# Patient Record
Sex: Female | Born: 1935
Health system: Southern US, Community
[De-identification: ages and names within clinical notes are randomized; demographics above are authoritative.]

## PROBLEM LIST (undated history)

## (undated) DIAGNOSIS — R609 Edema, unspecified: Secondary | ICD-10-CM

## (undated) DIAGNOSIS — K7689 Other specified diseases of liver: Secondary | ICD-10-CM

## (undated) DIAGNOSIS — E785 Hyperlipidemia, unspecified: Secondary | ICD-10-CM

## (undated) DIAGNOSIS — K589 Irritable bowel syndrome without diarrhea: Secondary | ICD-10-CM

## (undated) DIAGNOSIS — R079 Chest pain, unspecified: Secondary | ICD-10-CM

## (undated) DIAGNOSIS — H409 Unspecified glaucoma: Secondary | ICD-10-CM

## (undated) DIAGNOSIS — T7840XA Allergy, unspecified, initial encounter: Secondary | ICD-10-CM

## (undated) DIAGNOSIS — I10 Essential (primary) hypertension: Secondary | ICD-10-CM

## (undated) DIAGNOSIS — R002 Palpitations: Secondary | ICD-10-CM

## (undated) DIAGNOSIS — R7303 Prediabetes: Secondary | ICD-10-CM

## (undated) DIAGNOSIS — K219 Gastro-esophageal reflux disease without esophagitis: Secondary | ICD-10-CM

## (undated) DIAGNOSIS — J40 Bronchitis, not specified as acute or chronic: Secondary | ICD-10-CM

## (undated) HISTORY — DX: Other specified diseases of liver: K76.89

## (undated) HISTORY — DX: Irritable bowel syndrome, unspecified: K58.9

## (undated) HISTORY — PX: APPENDECTOMY: SHX54

## (undated) HISTORY — DX: Edema, unspecified: R60.9

## (undated) HISTORY — DX: Bronchitis, not specified as acute or chronic: J40

## (undated) HISTORY — PX: WRIST SURGERY: SHX841

## (undated) HISTORY — DX: Palpitations: R00.2

## (undated) HISTORY — DX: Chest pain, unspecified: R07.9

## (undated) HISTORY — DX: Allergy, unspecified, initial encounter: T78.40XA

## (undated) HISTORY — PX: DILATION AND CURETTAGE OF UTERUS: SHX78

## (undated) HISTORY — DX: Hyperlipidemia, unspecified: E78.5

## (undated) HISTORY — DX: Essential (primary) hypertension: I10

## (undated) HISTORY — DX: Gastro-esophageal reflux disease without esophagitis: K21.9

## (undated) HISTORY — DX: Unspecified glaucoma: H40.9

## (undated) HISTORY — DX: Prediabetes: R73.03

---

## 2005-06-13 LAB — HM COLONOSCOPY: HM Colonoscopy: NORMAL

## 2007-04-01 ENCOUNTER — Emergency Department (HOSPITAL_COMMUNITY): Admission: EM | Admit: 2007-04-01 | Discharge: 2007-04-01 | Payer: Self-pay | Admitting: Emergency Medicine

## 2007-04-18 ENCOUNTER — Ambulatory Visit: Payer: Self-pay | Admitting: Cardiology

## 2007-04-22 ENCOUNTER — Ambulatory Visit: Payer: Self-pay | Admitting: Cardiology

## 2007-04-22 LAB — CONVERTED CEMR LAB
ALT: 27 units/L (ref 0–35)
Albumin: 3.4 g/dL — ABNORMAL LOW (ref 3.5–5.2)
Alkaline Phosphatase: 76 units/L (ref 39–117)
Total Bilirubin: 0.8 mg/dL (ref 0.3–1.2)
Total Protein: 6.9 g/dL (ref 6.0–8.3)

## 2007-05-11 ENCOUNTER — Emergency Department (HOSPITAL_COMMUNITY): Admission: EM | Admit: 2007-05-11 | Discharge: 2007-05-11 | Payer: Self-pay | Admitting: Emergency Medicine

## 2007-06-01 ENCOUNTER — Emergency Department (HOSPITAL_COMMUNITY): Admission: EM | Admit: 2007-06-01 | Discharge: 2007-06-01 | Payer: Self-pay | Admitting: Emergency Medicine

## 2007-06-07 ENCOUNTER — Ambulatory Visit: Payer: Self-pay | Admitting: Gastroenterology

## 2007-06-07 LAB — CONVERTED CEMR LAB
AST: 30 units/L (ref 0–37)
Albumin: 3.4 g/dL — ABNORMAL LOW (ref 3.5–5.2)
Basophils Absolute: 0 10*3/uL (ref 0.0–0.1)
Bilirubin, Direct: 0.2 mg/dL (ref 0.0–0.3)
Calcium: 9.7 mg/dL (ref 8.4–10.5)
Chloride: 102 meq/L (ref 96–112)
Eosinophils Absolute: 0.1 10*3/uL (ref 0.0–0.6)
Eosinophils Relative: 1.9 % (ref 0.0–5.0)
GFR calc Af Amer: 91 mL/min
GFR calc non Af Amer: 75 mL/min
Glucose, Bld: 93 mg/dL (ref 70–99)
Lymphocytes Relative: 49.5 % — ABNORMAL HIGH (ref 12.0–46.0)
MCHC: 33 g/dL (ref 30.0–36.0)
MCV: 99.4 fL (ref 78.0–100.0)
Neutro Abs: 1.9 10*3/uL (ref 1.4–7.7)
Neutrophils Relative %: 38.7 % — ABNORMAL LOW (ref 43.0–77.0)
Platelets: 171 10*3/uL (ref 150–400)
RBC: 3.95 M/uL (ref 3.87–5.11)
Sed Rate: 13 mm/hr (ref 0–25)
Sodium: 141 meq/L (ref 135–145)
WBC: 4.8 10*3/uL (ref 4.5–10.5)

## 2007-06-10 ENCOUNTER — Ambulatory Visit (HOSPITAL_COMMUNITY): Admission: RE | Admit: 2007-06-10 | Discharge: 2007-06-10 | Payer: Self-pay | Admitting: Gastroenterology

## 2007-07-11 ENCOUNTER — Encounter: Admission: RE | Admit: 2007-07-11 | Discharge: 2007-07-11 | Payer: Self-pay | Admitting: Orthopedic Surgery

## 2007-07-19 ENCOUNTER — Ambulatory Visit: Payer: Self-pay | Admitting: Gastroenterology

## 2007-08-14 ENCOUNTER — Ambulatory Visit: Payer: Self-pay | Admitting: Cardiology

## 2007-08-21 ENCOUNTER — Ambulatory Visit: Payer: Self-pay | Admitting: Cardiology

## 2007-08-21 LAB — CONVERTED CEMR LAB
ALT: 19 units/L (ref 0–35)
AST: 29 units/L (ref 0–37)
Albumin: 3.3 g/dL — ABNORMAL LOW (ref 3.5–5.2)
Alkaline Phosphatase: 67 units/L (ref 39–117)
Cholesterol: 196 mg/dL (ref 0–200)
HDL: 71.7 mg/dL (ref >39.0)
Total Protein: 6.6 g/dL (ref 6.0–8.3)
Triglycerides: 48 mg/dL (ref 0–149)

## 2007-09-27 ENCOUNTER — Ambulatory Visit: Payer: Self-pay | Admitting: Gastroenterology

## 2007-09-27 DIAGNOSIS — K7689 Other specified diseases of liver: Secondary | ICD-10-CM

## 2007-09-27 DIAGNOSIS — K589 Irritable bowel syndrome without diarrhea: Secondary | ICD-10-CM

## 2007-10-29 ENCOUNTER — Ambulatory Visit: Payer: Self-pay | Admitting: Internal Medicine

## 2007-10-29 DIAGNOSIS — K219 Gastro-esophageal reflux disease without esophagitis: Secondary | ICD-10-CM

## 2007-10-29 DIAGNOSIS — J40 Bronchitis, not specified as acute or chronic: Secondary | ICD-10-CM

## 2007-10-29 DIAGNOSIS — J309 Allergic rhinitis, unspecified: Secondary | ICD-10-CM

## 2007-11-28 ENCOUNTER — Ambulatory Visit: Payer: Self-pay | Admitting: Internal Medicine

## 2007-12-01 ENCOUNTER — Emergency Department (HOSPITAL_COMMUNITY): Admission: EM | Admit: 2007-12-01 | Discharge: 2007-12-01 | Payer: Self-pay | Admitting: Emergency Medicine

## 2007-12-02 ENCOUNTER — Ambulatory Visit: Payer: Self-pay | Admitting: Cardiology

## 2007-12-03 ENCOUNTER — Ambulatory Visit: Payer: Self-pay | Admitting: Internal Medicine

## 2007-12-03 ENCOUNTER — Telehealth (INDEPENDENT_AMBULATORY_CARE_PROVIDER_SITE_OTHER): Payer: Self-pay | Admitting: *Deleted

## 2007-12-09 ENCOUNTER — Ambulatory Visit: Payer: Self-pay | Admitting: Cardiology

## 2007-12-09 LAB — CONVERTED CEMR LAB
ALT: 35 units/L (ref 0–35)
AST: 42 units/L — ABNORMAL HIGH (ref 0–37)
Alkaline Phosphatase: 71 units/L (ref 39–117)
Bilirubin, Direct: 0.1 mg/dL (ref 0.0–0.3)
Cholesterol: 163 mg/dL (ref 0–200)
Total Bilirubin: 0.8 mg/dL (ref 0.3–1.2)
Total Protein: 6.1 g/dL (ref 6.0–8.3)

## 2007-12-17 ENCOUNTER — Telehealth: Payer: Self-pay | Admitting: Internal Medicine

## 2007-12-18 ENCOUNTER — Ambulatory Visit: Payer: Self-pay | Admitting: Internal Medicine

## 2008-01-16 ENCOUNTER — Telehealth: Payer: Self-pay | Admitting: Gastroenterology

## 2008-03-02 ENCOUNTER — Telehealth (INDEPENDENT_AMBULATORY_CARE_PROVIDER_SITE_OTHER): Payer: Self-pay | Admitting: *Deleted

## 2008-03-23 ENCOUNTER — Ambulatory Visit: Payer: Self-pay | Admitting: Internal Medicine

## 2008-04-03 ENCOUNTER — Telehealth: Payer: Self-pay | Admitting: Internal Medicine

## 2008-04-21 ENCOUNTER — Telehealth (INDEPENDENT_AMBULATORY_CARE_PROVIDER_SITE_OTHER): Payer: Self-pay | Admitting: *Deleted

## 2008-05-07 ENCOUNTER — Ambulatory Visit: Payer: Self-pay | Admitting: Cardiology

## 2008-05-12 ENCOUNTER — Ambulatory Visit: Payer: Self-pay | Admitting: Cardiology

## 2008-05-12 LAB — CONVERTED CEMR LAB
Chloride: 106 meq/L (ref 96–112)
Creatinine, Ser: 0.9 mg/dL (ref 0.4–1.2)
GFR calc non Af Amer: 65 mL/min
LDL Cholesterol: 103 mg/dL — ABNORMAL HIGH (ref 0–99)
Sodium: 142 meq/L (ref 135–145)
TSH: 1.27 microintl units/mL (ref 0.35–5.50)
Total CHOL/HDL Ratio: 2.4
Triglycerides: 34 mg/dL (ref 0–149)

## 2008-05-22 ENCOUNTER — Ambulatory Visit: Payer: Self-pay | Admitting: Internal Medicine

## 2008-06-12 ENCOUNTER — Ambulatory Visit: Payer: Self-pay | Admitting: Gastroenterology

## 2008-07-03 ENCOUNTER — Telehealth (INDEPENDENT_AMBULATORY_CARE_PROVIDER_SITE_OTHER): Payer: Self-pay | Admitting: *Deleted

## 2008-09-12 ENCOUNTER — Emergency Department (HOSPITAL_COMMUNITY): Admission: EM | Admit: 2008-09-12 | Discharge: 2008-09-12 | Payer: Self-pay | Admitting: Emergency Medicine

## 2008-09-22 ENCOUNTER — Telehealth: Payer: Self-pay | Admitting: Cardiology

## 2008-09-23 DIAGNOSIS — R609 Edema, unspecified: Secondary | ICD-10-CM | POA: Insufficient documentation

## 2008-09-23 DIAGNOSIS — R079 Chest pain, unspecified: Secondary | ICD-10-CM

## 2008-09-23 DIAGNOSIS — R002 Palpitations: Secondary | ICD-10-CM

## 2008-09-23 DIAGNOSIS — I10 Essential (primary) hypertension: Secondary | ICD-10-CM | POA: Insufficient documentation

## 2008-09-30 ENCOUNTER — Ambulatory Visit: Payer: Self-pay | Admitting: Cardiology

## 2008-09-30 ENCOUNTER — Encounter: Payer: Self-pay | Admitting: Physician Assistant

## 2008-11-26 ENCOUNTER — Emergency Department (HOSPITAL_COMMUNITY): Admission: EM | Admit: 2008-11-26 | Discharge: 2008-11-26 | Payer: Self-pay | Admitting: Emergency Medicine

## 2008-12-09 ENCOUNTER — Telehealth: Payer: Self-pay | Admitting: Internal Medicine

## 2008-12-23 ENCOUNTER — Telehealth: Payer: Self-pay | Admitting: Cardiology

## 2008-12-31 ENCOUNTER — Ambulatory Visit: Payer: Self-pay | Admitting: Internal Medicine

## 2008-12-31 DIAGNOSIS — H6980 Other specified disorders of Eustachian tube, unspecified ear: Secondary | ICD-10-CM

## 2009-01-15 ENCOUNTER — Encounter (INDEPENDENT_AMBULATORY_CARE_PROVIDER_SITE_OTHER): Payer: Self-pay | Admitting: *Deleted

## 2009-02-16 ENCOUNTER — Telehealth: Payer: Self-pay | Admitting: Gastroenterology

## 2009-04-13 ENCOUNTER — Ambulatory Visit: Payer: Self-pay | Admitting: Cardiology

## 2009-04-26 ENCOUNTER — Telehealth: Payer: Self-pay | Admitting: Gastroenterology

## 2009-09-07 ENCOUNTER — Emergency Department (HOSPITAL_COMMUNITY): Admission: EM | Admit: 2009-09-07 | Discharge: 2009-09-07 | Payer: Self-pay | Admitting: Emergency Medicine

## 2009-09-09 LAB — HM MAMMOGRAPHY: HM Mammogram: NORMAL

## 2009-11-05 ENCOUNTER — Telehealth: Payer: Self-pay | Admitting: Cardiology

## 2009-11-10 ENCOUNTER — Encounter: Payer: Self-pay | Admitting: Physician Assistant

## 2009-11-10 ENCOUNTER — Telehealth: Payer: Self-pay | Admitting: Cardiology

## 2009-11-10 ENCOUNTER — Ambulatory Visit: Payer: Self-pay | Admitting: Internal Medicine

## 2009-11-10 DIAGNOSIS — R0602 Shortness of breath: Secondary | ICD-10-CM | POA: Insufficient documentation

## 2009-11-10 DIAGNOSIS — R42 Dizziness and giddiness: Secondary | ICD-10-CM

## 2009-11-11 ENCOUNTER — Telehealth: Payer: Self-pay | Admitting: Cardiology

## 2009-11-12 ENCOUNTER — Ambulatory Visit: Payer: Self-pay | Admitting: Internal Medicine

## 2009-11-15 ENCOUNTER — Encounter: Payer: Self-pay | Admitting: Cardiology

## 2009-11-16 ENCOUNTER — Telehealth: Payer: Self-pay | Admitting: Cardiology

## 2009-11-16 ENCOUNTER — Encounter: Payer: Self-pay | Admitting: Cardiology

## 2009-12-07 LAB — HM PAP SMEAR: HM Pap smear: NORMAL

## 2010-01-11 ENCOUNTER — Encounter: Payer: Self-pay | Admitting: Internal Medicine

## 2010-01-11 LAB — CONVERTED CEMR LAB: Pap Smear: NORMAL

## 2010-02-22 ENCOUNTER — Telehealth (INDEPENDENT_AMBULATORY_CARE_PROVIDER_SITE_OTHER): Payer: Self-pay | Admitting: *Deleted

## 2010-04-06 ENCOUNTER — Telehealth (INDEPENDENT_AMBULATORY_CARE_PROVIDER_SITE_OTHER): Payer: Self-pay | Admitting: *Deleted

## 2010-05-31 NOTE — Progress Notes (Signed)
  Phone Note Other Incoming   Request: Send information Summary of Call: Request for records received from Dr. Bosie Clos with [facility]. Request forwarded to [employer].      Appended Document:  Are you sending those?  I will let Dr Christella Hartigan know.  Appended Document:  I forwarded the request to [employer] for processing.

## 2010-05-31 NOTE — Progress Notes (Signed)
Summary: names of meds  Phone Note Call from Patient   Caller: Patient Reason for Call: Talk to Nurse Summary of Call: pt calling with names of meds, clindamycine hcl 300mg , hydrocodon 5-500, strontiun, cal-mag-zinc with vitamin d, advanced k2 complex Initial call taken by: Glynda Jaeger,  November 11, 2009 9:34 AM

## 2010-05-31 NOTE — Progress Notes (Signed)
  Phone Note Other Incoming   Request: Send information Summary of Call: Request for records received from Dr. Charlott Rakes. Request forwarded to [employer].

## 2010-05-31 NOTE — Progress Notes (Signed)
Summary: call back medication list  Phone Note Call from Patient Call back at Home Phone [phone]   Caller: Patient Reason for Call: Talk to Nurse Summary of Call: per pt was told to call back with medication list.  Initial call taken by: Lorne Skeens,  November 10, 2009 5:02 PM  Follow-up for Phone Call        NUMBER LISTED IN MESSAGE HAS BEEN DISCONNECTED.AWAITNG  RETURN CALL FROM PT./ Follow-up by: Scherrie Bateman, LPN,  November 10, 2009 5:15 PM

## 2010-05-31 NOTE — Assessment & Plan Note (Signed)
Summary: wheezing/cb   Primary Provider/Referring Provider:  Lamar Blinks  CC:  Wheezing (OV per Cardio dr) "hard to get a breathe"..  History of Present Illness: 03/23/08- Allergic rhinitis, bronchitis Head congested at night, even before lying down. Sore throats come and go, without dysphagia. She cites family hx thyroid cancer. Denies blood, nodes, fever or purulent.  05/22/08- Allergic rhinitis, bronchitits Dr Vonita Moss helping with incontinence. Today chest is good. Right>Left ear bothers by fluttering without pain. Pulsation like pulse in ear Nonpurulent, no fever. she was concerned that her neck was small- reassured.  12/31/08- Allergic rhinitis, bronchitis Again c/o thumping in right ear, especially when her sinuses are congested. Says Dr Ezzard Standing didn't find anything concerning. Will last 3-4 days at a time.AB otic drops may slow it a little. Denies pain, discharge and doesn't think hearing is declining notably.  November 12, 2009-  Feeling wheezy and weak this week. Hard to get her breath. Cardiologist told her heart was ok,  but heard wheezing. Proair helps some. Not coughing productively and denies chest pain, palpitation or fever. Now on clindamycin for dental problems.   Preventive Screening-Counseling & Management  Alcohol-Tobacco     Smoking Status: never  Current Medications (verified): 1)  Prevacid 30 Mg  Cpdr (Lansoprazole) .Marland Kitchen.. 1 Once Daily 2)  Toprol Xl 25 Mg  Tb24 (Metoprolol Succinate) .Marland Kitchen.. 1 Once Daily 3)  Niacin Cr 500 Mg  Cpcr (Niacin) .... 1/2  At Morning and One Atbedtime 4)  Vitamin D 400 Unit  Tabs (Cholecalciferol) .... Take As Needed 5)  Fish Oil   Oil (Fish Oil) .Marland Kitchen.. 1 Once Daily 6)  Cvs Selenium 200 Mcg  Tabs (Selenium) .Marland Kitchen.. 1 Once Daily 7)  Folic Acid   Powd (Folic Acid) .Marland Kitchen.. 1 Once Daily 8)  Ear Drops 1.4-5.4 %  Soln (Benzocaine-Antipyrine) .Marland Kitchen.. 1-2 Drops in Ear Two Times A Day As Needed 9)  Proair Hfa 108 (90 Base) Mcg/act  Aers (Albuterol Sulfate)  .... 2 Puffs Every 4 Hours As Needed 10)  Clarinex 5 Mg  Tabs (Desloratadine) .Marland Kitchen.. 1 Once Daily 11)  Flonase 50 Mcg/act Susp (Fluticasone Propionate) .... Use As Directed 12)  Calcium-Magnesium-Zinc 333-133-8.3 Mg Tabs (Calcium-Magnesium-Zinc) .... Take 1 By Mouth Once Daily 13)  Strontium Chloride  Crys (Strontium Chloride) .... Take 1 By Mouth Once Daily 14)  Super K With Advanced K2 Complex .... Take 1 By Mouth Once Daily  Allergies (verified): 1)  ! Pcn 2)  ! Sulfa 3)  ! Asa 4)  ! * Avelox 5)  ! Hydrochlorothiazide  Past History:  Past Medical History: Last updated: 09/23/2008 CHEST PAIN-UNSPECIFIED (ICD-786.50) PALPITATIONS (ICD-785.1) EDEMA (ICD-782.3) HYPERTENSION, UNSPECIFIED (ICD-401.9) ALLERGIC RHINITIS (ICD-477.9) BRONCHITIS (ICD-490) GERD (ICD-530.81) IRRITABLE BOWEL SYNDROME (ICD-564.1) HEPATIC CYST (ICD-573.8)  Past Surgical History: Last updated: 04/13/2009 Appendectomy Wrist surgeries D&C x 3  Family History: Last updated: 09/23/2008  Noncontributory for early coronary artery disease.     rheumatism and asthma is mother allergies in sister 3 brothers with no known medical history Mother- deceased age 71 1/2 Father deceased age 76 1/2 DM, Allergies, Asthma, and Arthritis runs in the family.  Social History: Last updated: 12/23/2007 widowed adopted 1 daughter lives alone retired no etoh Positive history of passive tobacco smoke exposure.   Risk Factors: Smoking Status: never (11/12/2009) Passive Smoke Exposure: yes (12/23/2007)  Review of Systems      See HPI       The patient complains of shortness of breath with activity and non-productive cough.  The patient denies shortness of breath at rest, productive cough, coughing up blood, chest pain, irregular heartbeats, acid heartburn, indigestion, loss of appetite, weight change, abdominal pain, difficulty swallowing, sore throat, tooth/dental problems, headaches, nasal congestion/difficulty  breathing through nose, and sneezing.    Vital Signs:  Patient profile:   77 year old female Height:      61 inches Weight:      162.13 pounds BMI:     30.74 O2 Sat:      99 % on Room air Pulse rate:   63 / minute BP sitting:   126 / 80  (left arm) Cuff size:   regular  Vitals Entered By: Reynaldo Minium CMA (November 12, 2009 3:34 PM)  O2 Flow:  Room air CC: Wheezing (OV per Cardio dr) "hard to get a breathe".   Physical Exam  Additional Exam:  General: A/Ox3; pleasant and cooperative, NAD, SKIN: no rash, lesions NODES: no lymphadenopathy HEENT: [place]/AT, EOM- WNL, Conjuctivae- clear, PERRLA, TM-WNL, Nose- clear, Throat- clear and wnl NECK: Supple w/ fair ROM, JVD- none, normal carotid impulses w/o bruits Thyroid-  CHEST: Clear to P&A, diminished with trace wheeze HEART: RRR, no m/g/r heard ABDOMEN: Soft and nl;  NWG:NFAO, nl pulses, no edema  NEURO: Grossly intact to observation      Impression & Recommendations:  Problem # 1:  BRONCHITIS (ICD-490) Increased wheezing dyspnea attributable to asthmatic bronchitis. This may be an airquality problem, but we will be looking for reversible conditions. We will give her a depomedrol injection today. She is concerned about her ride home. If this doesn't fix her, we will know by the first of the week. Her updated medication list for this problem includes:    Proair Hfa 108 (90 Base) Mcg/act Aers (Albuterol sulfate) .Marland Kitchen... 2 puffs every 4 hours as needed  Problem # 2:  ALLERGIC RHINITIS (ICD-477.9) Flonase has been sufficient to control nasal congestion and she is not complaining much about her nasal airway at this time. Her updated medication list for this problem includes:    Clarinex 5 Mg Tabs (Desloratadine) .Marland Kitchen... 1 once daily    Flonase 50 Mcg/act Susp (Fluticasone propionate) ..... Use as directed  Medications Added to Medication List This Visit: 1)  Calcium-magnesium-zinc 333-133-8.3 Mg Tabs (Calcium-magnesium-zinc) .... Take 1  by mouth once daily 2)  Strontium Chloride Crys (Strontium chloride) .... Take 1 by mouth once daily 3)  Super K With Advanced K2 Complex  .... Take 1 by mouth once daily  Other Orders: Est. Patient Level III (13086) Depo- Medrol 80mg  (J1040) Admin of Therapeutic Inj  intramuscular or subcutaneous (57846)  Patient Instructions: 1)  Please schedule a follow-up appointment in 6 months. 2)  depo 80     Medication Administration  Injection # 1:    Medication: Depo- Medrol 80mg     Diagnosis: BRONCHITIS (ICD-490)    Route: IM    Site: LUOQ gluteus    Exp Date: 07/2012    Lot #: [id number]    Mfr: Pharmacia    Patient tolerated injection without complications    Given by: Gweneth Dimitri RN (November 12, 2009 4:31 PM)  Orders Added: 1)  Est. Patient Level III [96295] 2)  Depo- Medrol 80mg  [J1040] 3)  Admin of Therapeutic Inj  intramuscular or subcutaneous [28413]

## 2010-05-31 NOTE — Progress Notes (Signed)
Summary: Colonoscopy  Phone Note Call from Patient Call back at Home Phone [phone]   Caller: Patient Call For: Dr. Christella Hartigan Reason for Call: Talk to Nurse Summary of Call: Pt is wanting to know when her next colonoscopy is due Initial call taken by: Karna Christmas,  April 26, 2009 4:01 PM  Follow-up for Phone Call        explained that her next colon due 2017.  She is having some black stools.  She does not want to make an appt she wants to talk to her nephew who is a doctor first.  I explained that I would be happy to make her an appt to see Dr Christella Hartigan but she insist she will call after discussing with her nephew. Follow-up by: Chales Abrahams CMA Duncan Dull),  April 26, 2009 4:18 PM  Additional Follow-up for Phone Call Additional follow up Details #1::        ok Additional Follow-up by: Rachael Fee MD,  April 27, 2009 8:28 AM

## 2010-05-31 NOTE — Letter (Signed)
Summary: Request for records from [facility], Main Office  [address]   [place], [place] [address]   Phone: [phone]  Fax: [phone]         Medical Records [place]   November 16, 2009 MRN: [id number]    RE:  Brianna Mcdonald      [address]      [place], [place]  [address]      DOB 06-08-1932   [name] was seen in the emergency department there 11/15/2009 and is unable to comminicate clearly what occurred. Please fax records from her ED visit to [phone]  Attn:  Pam         Sincerely,  Charolotte Capuchin, RN  This letter has been electronically signed by your physician.  Appended Document: Request for records from [facility] Recieved from [facility]gave to [facility]

## 2010-05-31 NOTE — Progress Notes (Signed)
Summary: pt wants to be seen next week  Phone Note Call from Patient   Caller: Patient Reason for Call: Talk to Nurse, Talk to Doctor Summary of Call: pt wants to be seen next week she has been very tired and weak and sometimes even feels like she will faint. Explained to patient we don't have any appt for next week so she wanted me to send message Initial call taken by: Omer Jack,  November 05, 2009 3:49 PM  Follow-up for Phone Call        for past few weeks she has had bladder inflammation and gums swollen.  feels tired and very week.  has not had chest pain, has had pain in lower part of back.  has been on antibiotics.  went to primary md on Tuesday, they did blood work and want her to come back for follow up next Tuesday.  Is going to have dental work at [facility], will need antibiotics. Pt wants to be seen in follow up and wants an appointment prior to  her appt at [facility].  appt given with PA 11/10/2009 Follow-up by: Charolotte Capuchin, RN,  November 05, 2009 4:33 PM

## 2010-05-31 NOTE — Progress Notes (Signed)
Summary: pt calling re heart "emergency" she had at the dentist this week  Phone Note Call from Patient   Caller: Patient Reason for Call: Talk to Nurse Summary of Call: pt had dental vist this week and had "an emergency" with her heart, and wants to see dr Azizi Bally thurs or friday-pls call [phone] Initial call taken by: Glynda Jaeger,  November 16, 2009 1:50 PM  Follow-up for Phone Call        [facility] while at the dentist, had a bad headache and heart burn on the right side but was able to go thru with the exam.  She noticed on the bus ride going 2 fingers on her left hand went numb.  Burping alot, indegestion - started shaking and couldnt stop - emergency team had to take her to the ED - they gave her SL Ntg and 4 baby ASA with relief.  Wants an appointment.  No s/s now.  Will call Lennie Hummer for information  Requested records from [facility] Follow-up by: Charolotte Capuchin, RN,  November 16, 2009 2:45 PM  Additional Follow-up for Phone Call Additional follow up Details #1::        Received notes from [facility]: pt had atypical chest pain that was ruled to be GI.  She was instructed to schedule follow up with Dr Antoine Poche.  She was also given instruction to immediate care if chest pain reoccurs.  Appt will be made at next available Additional Follow-up by: Charolotte Capuchin, RN,  November 23, 2009 12:55 PM

## 2010-05-31 NOTE — Assessment & Plan Note (Signed)
Summary: weakness, doesn't feel well   Visit Type:  Follow-up Primary Provider:  Lamar Blinks  CC:  weakness -wheezing-sob -twinges on left side.Pt states she is on an antibotic and a pain pill.Marland Kitchen  History of Present Illness: This is a 77 year old [place] American female patient who comes in with one month history of increased weakness and wheezing. She says when she goes outside she feels she can't breathe and her asthma has worsened. She also says she gets weak in her legs. She has some dizziness but no syncope. She denies significant palpitations as she's had in the past. She became so short of breath she called the paramedics a week ago but she was now hospitalized. She said that time her air conditioning was not working.  Current Medications (verified): 1)  Prevacid 30 Mg  Cpdr (Lansoprazole) .Marland Kitchen.. 1 Once Daily 2)  Toprol Xl 25 Mg  Tb24 (Metoprolol Succinate) .Marland Kitchen.. 1 Once Daily 3)  Niacin Cr 500 Mg  Cpcr (Niacin) .... 1/2  At Morning and One Atbedtime 4)  Vitamin D 400 Unit  Tabs (Cholecalciferol) .... Take As Needed 5)  Fish Oil   Oil (Fish Oil) .Marland Kitchen.. 1 Once Daily 6)  Cvs Selenium 200 Mcg  Tabs (Selenium) .Marland Kitchen.. 1 Once Daily 7)  Folic Acid   Powd (Folic Acid) .Marland Kitchen.. 1 Once Daily 8)  Ear Drops 1.4-5.4 %  Soln (Benzocaine-Antipyrine) .Marland Kitchen.. 1-2 Drops in Ear Two Times A Day As Needed 9)  Proair Hfa 108 (90 Base) Mcg/act  Aers (Albuterol Sulfate) .... 2 Puffs Every 4 Hours As Needed 10)  Clarinex 5 Mg  Tabs (Desloratadine) .Marland Kitchen.. 1 Once Daily 11)  Flonase 50 Mcg/act Susp (Fluticasone Propionate) .... Use As Directed  Allergies (verified): 1)  ! Pcn 2)  ! Sulfa 3)  ! Asa 4)  ! * Avelox 5)  ! Hydrochlorothiazide  Past History:  Past Medical History: Last updated: 09/23/2008 CHEST PAIN-UNSPECIFIED (ICD-786.50) PALPITATIONS (ICD-785.1) EDEMA (ICD-782.3) HYPERTENSION, UNSPECIFIED (ICD-401.9) ALLERGIC RHINITIS (ICD-477.9) BRONCHITIS (ICD-490) GERD (ICD-530.81) IRRITABLE BOWEL SYNDROME  (ICD-564.1) HEPATIC CYST (ICD-573.8)  Review of Systems       see history of present illness  Vital Signs:  Patient profile:   77 year old female Height:      61 inches Weight:      160 pounds BMI:     30.34 Pulse rate:   57 / minute Pulse (ortho):   55 / minute BP sitting:   132 / 68  (left arm) BP standing:   127 / 70 Cuff size:   regular  Vitals Entered By: Burnett Kanaris, CNA (November 10, 2009 10:10 AM)  Serial Vital Signs/Assessments:  Time      Position  BP       Pulse  Resp  Temp     By 0 min     Lying LA  124/67   55                    Burnett Kanaris, CNA 0 min     Sitting   125/69   59                    Burnett Kanaris, CNA 0 min     Standing  127/70   55                    Burnett Kanaris, CNA 2 min     Standing  127/72   56  Burnett Kanaris, CNA 3 min     Standing  125/71   57                    Burnett Kanaris, CNA  Comments: 0 min very little dizziness when lying to sitting from sitting to standing pt states no dizziness By: Burnett Kanaris, CNA  2 min no dizziness By: Burnett Kanaris, CNA  3 min no dizziness By: Burnett Kanaris, CNA    Physical Exam  General:   Well-nournished, in no acute distress. Neck: No JVD, HJR, Bruit, or thyroid enlargement Lungs: Decreased breath sounds with shortened expiratory rates.No tachypnea, clear without wheezing, rales, or rhonchi Cardiovascular: RRR, PMI not displaced, heart sounds normal, no murmurs, gallops, bruit, thrill, or heave. Abdomen: BS normal. Soft without organomegaly, masses, lesions or tenderness. Extremities: without cyanosis, clubbing or edema. Good distal pulses bilateral SKin: Warm, no lesions or rashes  Musculoskeletal: No deformities Neuro: no focal signs    EKG  Procedure date:  11/10/2009  Findings:      sinus bradycardia minimal LVH  Impression & Recommendations:  Problem # 1:  DIZZINESS (ICD-780.4) Patient comes in today with weakness and some dizziness. She is not  orthostatic. I believe most her problems stem from her asthma and extreme heat.  Problem # 2:  DYSPNEA (ICD-786.05) patient has dyspnea when she sat in the extreme heat with some wheezing. She is on inhalers. I've asked her to followup with her pulmonologist Dr. Maple Hudson. The following medications were removed from the medication list:    Hydrochlorothiazide 25 Mg Tabs (Hydrochlorothiazide) .Marland Kitchen... Take one daily Her updated medication list for this problem includes:    Toprol Xl 25 Mg Tb24 (Metoprolol succinate) .Marland Kitchen... 1 once daily  Problem # 3:  PALPITATIONS (ICD-785.1) Patient is not bothered by palpitations at this time. Her updated medication list for this problem includes:    Toprol Xl 25 Mg Tb24 (Metoprolol succinate) .Marland Kitchen... 1 once daily  Patient Instructions: 1)  Your physician recommends that you schedule a follow-up appointment in: Dr. Antoine Poche 1 year 2)  Avoid extreme heat. 3)  Follow up with Dr. Maple Hudson for asthma.

## 2010-05-31 NOTE — Consult Note (Signed)
Summary: [facility] Consult   [facility] Consult   Imported By: Roderic Ovens 11/29/2009 10:57:04  _____________________________________________________________________  External Attachment:    Type:   Image     Comment:   External Document

## 2010-06-15 ENCOUNTER — Encounter (INDEPENDENT_AMBULATORY_CARE_PROVIDER_SITE_OTHER): Payer: Self-pay | Admitting: *Deleted

## 2010-06-16 ENCOUNTER — Encounter: Payer: Self-pay | Admitting: Internal Medicine

## 2010-06-21 ENCOUNTER — Encounter: Payer: Self-pay | Admitting: Internal Medicine

## 2010-06-22 ENCOUNTER — Encounter: Payer: Self-pay | Admitting: Cardiology

## 2010-06-22 ENCOUNTER — Ambulatory Visit (INDEPENDENT_AMBULATORY_CARE_PROVIDER_SITE_OTHER): Payer: Medicare Other | Admitting: Cardiology

## 2010-06-22 ENCOUNTER — Ambulatory Visit: Payer: Self-pay | Admitting: Cardiology

## 2010-06-22 DIAGNOSIS — R002 Palpitations: Secondary | ICD-10-CM

## 2010-06-22 DIAGNOSIS — I10 Essential (primary) hypertension: Secondary | ICD-10-CM

## 2010-06-22 NOTE — Letter (Signed)
Summary: Appointment - Reschedule  [facility], Main Office  [address]   [place], [place] [address]   Phone: [phone]  Fax: [phone]     June 15, 2010 MRN: [id number]   Brianna Mcdonald [address] [place], [place]  [address]   Dear Ms. Huizar,   Due to a change in our office schedule, your appointment on  06-22-10  at  11:30A             must be changed.  It is very important that we reach you to reschedule this appointment. We look forward to participating in your health care needs. Please contact us at the number listed above at your earliest convenience to reschedule this appointment.     Sincerely,  Glass blower/designer Phone d/c

## 2010-06-24 ENCOUNTER — Other Ambulatory Visit: Payer: Self-pay

## 2010-06-24 ENCOUNTER — Other Ambulatory Visit (INDEPENDENT_AMBULATORY_CARE_PROVIDER_SITE_OTHER): Payer: Medicare Other

## 2010-06-24 ENCOUNTER — Encounter (INDEPENDENT_AMBULATORY_CARE_PROVIDER_SITE_OTHER): Payer: Self-pay | Admitting: *Deleted

## 2010-06-24 DIAGNOSIS — R002 Palpitations: Secondary | ICD-10-CM

## 2010-06-24 DIAGNOSIS — I10 Essential (primary) hypertension: Secondary | ICD-10-CM

## 2010-06-24 DIAGNOSIS — E785 Hyperlipidemia, unspecified: Secondary | ICD-10-CM

## 2010-06-24 DIAGNOSIS — Z79899 Other long term (current) drug therapy: Secondary | ICD-10-CM

## 2010-06-24 LAB — LIPID PANEL
Cholesterol: 176 mg/dL (ref 0–200)
HDL: 59 mg/dL (ref >39.00)
LDL Cholesterol: 106 mg/dL — ABNORMAL HIGH (ref 0–99)
Triglycerides: 55 mg/dL (ref 0.0–149.0)

## 2010-06-24 LAB — CBC WITH DIFFERENTIAL/PLATELET
Basophils Absolute: 0 10*3/uL (ref 0.0–0.1)
Eosinophils Absolute: 0.1 10*3/uL (ref 0.0–0.7)
Lymphocytes Relative: 41.7 % (ref 12.0–46.0)
MCHC: 34.2 g/dL (ref 30.0–36.0)
Neutrophils Relative %: 46 % (ref 43.0–77.0)
RDW: 13.7 % (ref 11.5–14.6)

## 2010-06-24 LAB — HEPATIC FUNCTION PANEL
AST: 35 U/L (ref 0–37)
Albumin: 3.3 g/dL — ABNORMAL LOW (ref 3.5–5.2)
Alkaline Phosphatase: 100 U/L (ref 39–117)
Total Protein: 6.3 g/dL (ref 6.0–8.3)

## 2010-06-24 LAB — BASIC METABOLIC PANEL
Calcium: 8.9 mg/dL (ref 8.4–10.5)
Chloride: 105 mEq/L (ref 96–112)
Creatinine, Ser: 0.9 mg/dL (ref 0.4–1.2)
Sodium: 141 mEq/L (ref 135–145)

## 2010-06-27 ENCOUNTER — Encounter: Payer: Self-pay | Admitting: Internal Medicine

## 2010-06-27 ENCOUNTER — Encounter: Payer: Self-pay | Admitting: Cardiology

## 2010-06-27 ENCOUNTER — Ambulatory Visit (INDEPENDENT_AMBULATORY_CARE_PROVIDER_SITE_OTHER): Payer: Medicare Other | Admitting: Internal Medicine

## 2010-06-27 DIAGNOSIS — I10 Essential (primary) hypertension: Secondary | ICD-10-CM

## 2010-06-27 DIAGNOSIS — K219 Gastro-esophageal reflux disease without esophagitis: Secondary | ICD-10-CM

## 2010-06-28 NOTE — Letter (Signed)
Summary: [facility]  [facility]   Imported By: Sherian Rein 06/22/2010 07:31:28  _____________________________________________________________________  External Attachment:    Type:   Image     Comment:   External Document

## 2010-06-28 NOTE — Assessment & Plan Note (Signed)
Summary: rov/gd   Visit Type:  Follow-up Primary Provider:  Dr. Sanda Linger  CC:  HTN and Palpitations.  History of Present Illness: The patient presents for followup of palpitations and hypertension. In the summer she had problems with some arm discomfort and palpitations and an acute event while in a dentist office. She was treated and released from the emergency room at [facility]. She does report a followup stress perfusion study which she says was normal. Since that time she has had no chest or arm discomfort. She will rarely gets palpitations but has had no presyncope or syncope. She has had no new shortness of breath, PND or orthopnea.  Current Medications (verified): 1)  Prevacid 30 Mg  Cpdr (Lansoprazole) .Marland Kitchen.. 1 Once Daily 2)  Toprol Xl 25 Mg  Tb24 (Metoprolol Succinate) .Marland Kitchen.. 1 Once Daily 3)  Niacin Cr 500 Mg  Cpcr (Niacin) .... 1/2  At Morning and One Atbedtime 4)  Vitamin D 400 Unit  Tabs (Cholecalciferol) .... Take As Needed 5)  Fish Oil   Oil (Fish Oil) .Marland Kitchen.. 1 Once Daily 6)  Cvs Selenium 200 Mcg  Tabs (Selenium) .Marland Kitchen.. 1 Once Daily 7)  Folic Acid   Powd (Folic Acid) .Marland Kitchen.. 1 Once Daily 8)  Ear Drops 1.4-5.4 %  Soln (Benzocaine-Antipyrine) .Marland Kitchen.. 1-2 Drops in Ear Two Times A Day As Needed 9)  Proair Hfa 108 (90 Base) Mcg/act  Aers (Albuterol Sulfate) .... 2 Puffs Every 4 Hours As Needed 10)  Clarinex 5 Mg  Tabs (Desloratadine) .Marland Kitchen.. 1 Once Daily 11)  Flonase 50 Mcg/act Susp (Fluticasone Propionate) .... Use As Directed 12)  Calcium-Magnesium-Zinc 333-133-8.3 Mg Tabs (Calcium-Magnesium-Zinc) .... Take 1 By Mouth Once Daily 13)  Strontium Chloride  Crys (Strontium Chloride) .... Take 1 By Mouth Once Daily 14)  Super K With Advanced K2 Complex .... Take 1 By Mouth Once Daily 15)  Omega Xl .Marland Kitchen.. 1 By Mouth Daily  Allergies (verified): 1)  ! Pcn 2)  ! Sulfa 3)  ! Asa 4)  ! * Avelox 5)  ! Hydrochlorothiazide  Past History:  Past Medical History: Reviewed history from 09/23/2008 and no  changes required. CHEST PAIN-UNSPECIFIED (ICD-786.50) PALPITATIONS (ICD-785.1) EDEMA (ICD-782.3) HYPERTENSION, UNSPECIFIED (ICD-401.9) ALLERGIC RHINITIS (ICD-477.9) BRONCHITIS (ICD-490) GERD (ICD-530.81) IRRITABLE BOWEL SYNDROME (ICD-564.1) HEPATIC CYST (ICD-573.8)  Past Surgical History: Reviewed history from 04/13/2009 and no changes required. Appendectomy Wrist surgeries D&C x 3  Review of Systems       As stated in the HPI and negative for all other systems except for joint pain.   Vital Signs:  Patient profile:   77 year old female Height:      61 inches Weight:      151 pounds BMI:     28.63 Pulse rate:   60 / minute Resp:     16 per minute BP sitting:   128 / 72  (right arm)  Vitals Entered By: Marrion Coy, CNA (June 22, 2010 11:03 AM)  Physical Exam  General:  Well developed, well nourished, in no acute distress. Head:  normocephalic and atraumatic Eyes:  PERRLA/EOM intact; conjunctiva and lids normal. Mouth:  Gums and palate normal. Oral mucosa normal. Neck:  Neck supple, no JVD. No masses, thyromegaly or abnormal cervical nodes. Lungs:  Clear bilaterally to auscultation and percussion. Abdomen:  Bowel sounds positive; abdomen soft and non-tender without masses, organomegaly, or hernias noted. No hepatosplenomegaly. Msk:  Back normal, normal gait. Muscle strength and tone normal. Extremities:  No clubbing or cyanosis. Neurologic:  Alert and oriented x 3. Skin:  Intact without lesions or rashes. Psych:  Normal affect.   Detailed Cardiovascular Exam  Vascular    Pedal Pulses: pulses normal in all 4 extremities   EKG  Procedure date:  06/22/2010  Findings:      Sinus bradycardia, rate 57, axis within normal limits, intervals within normal limits, no acute ST-T wave changes.  Impression & Recommendations:  Problem # 1:  PALPITATIONS (ICD-785.1) She is having no acute palpitations. No change in therapy is indicated.  Problem # 2:  CHEST  PAIN-UNSPECIFIED (ICD-786.50) We will try to get her recent nuclear records from [facility]. Otherwise, no further workup is indicated.  Problem # 3:  HYPERTENSION, UNSPECIFIED (ICD-401.9) Her blood pressure is controlled. I will order some routine labs as she is due to see Dr. Yetta Barre and has had no blood work to followup medication or other since switching to our practice primary care.  Patient Instructions: 1)  Your physician recommends that you schedule a follow-up appointment in: 12 MONTHS WITH DR [facility] 2)  Your physician recommends that you return for a FASTING lipid, liver, BMP, TSH and CBC profile: 401.1 272.4 V58.69 and 785.1

## 2010-07-07 NOTE — Letter (Signed)
Summary: Custom - Lipid  [facility], Main Office  [address]   [place], [place] [address]   Phone: [phone]  Fax: [phone]       June 27, 2010 MRN: [id number]   Brianna Mcdonald [address] [place], [place]  [address]   Dear Ms. Sterling,  We have reviewed your cholesterol results.  They are as follows:     Total Cholesterol:    176 (Desirable: less than 200)       HDL  Cholesterol:     59.00  (Desirable: greater than 40 for men and 50 for women)       LDL Cholesterol:       106  (Desirable: less than 100 for low risk and less than 70 for moderate to high risk)       Triglycerides:       55.0  (Desirable: less than 150)  Our recommendations include:  Looks good, no changes needed   Call our office at the number listed above if you have any questions.  Lowering your LDL cholesterol is important, but it is only one of a large number of "risk factors" that may indicate that you are at risk for heart disease, stroke or other complications of hardening of the arteries.  Other risk factors include:   A.  Cigarette Smoking* B.  High Blood Pressure* C.  Obesity* D.   Low HDL Cholesterol (see yours above)* E.   Diabetes Mellitus (higher risk if your is uncontrolled) F.  Family history of premature heart disease G.  Previous history of stroke or cardiovascular disease    *These are risk factors YOU HAVE CONTROL OVER.  For more information, visit .         There is now evidence that lowering the TOTAL CHOLESTEROL AND LDL CHOLESTEROL can reduce the risk of heart disease.  The American Heart Association recommends the following guidelines for the treatment of elevated cholesterol:  1.  If there is now current heart disease and less than two risk factors, TOTAL CHOLESTEROL should be less than 200 and LDL CHOLESTEROL should be less than 100. 2.  If there is current heart disease or two or more risk factors, TOTAL CHOLESTEROL should be less  than 200 and LDL CHOLESTEROL should be less than 70.  A diet low in cholesterol, saturated fat, and calories is the cornerstone of treatment for elevated cholesterol.  Cessation of smoking and exercise are also important in the management of elevated cholesterol and preventing vascular disease.  Studies have shown that 30 to 60 minutes of physical activity most days can help lower blood pressure, lower cholesterol, and keep your weight at a healthy level.  Drug therapy is used when cholesterol levels do not respond to therapeutic lifestyle changes (smoking cessation, diet, and exercise) and remains unacceptably high.  If medication is started, it is important to have you levels checked periodically to evaluate the need for further treatment options.    Thank you,     Avie Arenas, RN for Dr. Rollene Rotunda [facility] Team

## 2010-07-07 NOTE — Assessment & Plan Note (Signed)
Summary: new / medicare   Vital Signs:  Patient profile:   77 year old female Menstrual status:  postmenopausal Height:      61 inches Weight:      153 pounds O2 Sat:      97 % on Room air Temp:     98.2 degrees F oral Pulse rate:   60 / minute Pulse rhythm:   regular Resp:     16 per minute BP sitting:   112 / 62  (left arm)  O2 Flow:  Room air  Primary Care Provider:  Dr. Sanda Linger  CC:  Heartburn.  History of Present Illness:  Heartburn      This is a 77 year old woman who presents with Heartburn.  The symptoms began 2 weeks ago.  The intensity is described as moderate.  The patient reports acid reflux, sour taste in mouth, and epigastric pain, but denies chest pain, trouble swallowing, weight loss, and weight gain.  The patient denies the following alarm features: melena, dysphagia, hematemesis, vomiting, involuntary weight loss >5%, and history of anemia.  Symptoms are worse with spicy foods.    Dyspepsia History:      She has no alarm features of dyspepsia including no history of melena, hematochezia, dysphagia, persistent vomiting, or involuntary weight loss > 5%.  There is a prior history of GERD.  The patient does not have a prior history of documented ulcer disease.  The dominant symptom is heartburn or acid reflux.  An H-2 blocker medication is not currently being taken.    Hypertension History:      She denies headache, chest pain, palpitations, dyspnea with exertion, orthopnea, PND, peripheral edema, visual symptoms, neurologic problems, syncope, and side effects from treatment.  She notes no problems with any antihypertensive medication side effects.        Positive major cardiovascular risk factors include female age 75 years old or older and hypertension.  Negative major cardiovascular risk factors include no history of diabetes or hyperlipidemia, negative family history for ischemic heart disease, and non-tobacco-user status.        Further assessment for target  organ damage reveals no history of ASHD, cardiac end-organ damage (CHF/LVH), stroke/TIA, peripheral vascular disease, renal insufficiency, or hypertensive retinopathy.     Preventive Screening-Counseling & Management  Alcohol-Tobacco     Alcohol drinks/day: 0     Alcohol Counseling: not indicated; patient does not drink     Smoking Status: never     Tobacco Counseling: not indicated; no tobacco use  Hep-HIV-STD-Contraception     Hepatitis Risk: no risk noted     HIV Risk: no risk noted     STD Risk: no risk noted      Drug Use:  never.        Blood Transfusions:  no.    Allergies: 1)  ! Pcn 2)  ! Sulfa 3)  ! Asa 4)  ! * Avelox 5)  ! Hydrochlorothiazide  Past History:  Past Medical History: Last updated: 09/23/2008 CHEST PAIN-UNSPECIFIED (ICD-786.50) PALPITATIONS (ICD-785.1) EDEMA (ICD-782.3) HYPERTENSION, UNSPECIFIED (ICD-401.9) ALLERGIC RHINITIS (ICD-477.9) BRONCHITIS (ICD-490) GERD (ICD-530.81) IRRITABLE BOWEL SYNDROME (ICD-564.1) HEPATIC CYST (ICD-573.8)  Past Surgical History: Last updated: 04/13/2009 Appendectomy Wrist surgeries D&C x 3  Family History: Last updated: 09/23/2008  Noncontributory for early coronary artery disease.     rheumatism and asthma is mother allergies in sister 3 brothers with no known medical history Mother- deceased age 92 1/2 Father deceased age 62 1/2 DM, Allergies, Asthma,  and Arthritis runs in the family.  Social History: Last updated: 12/23/2007 widowed adopted 1 daughter lives alone retired no etoh Positive history of passive tobacco smoke exposure.   Risk Factors: Alcohol Use: 0 (06/27/2010)  Social History: Hepatitis Risk:  no risk noted HIV Risk:  no risk noted STD Risk:  no risk noted Drug Use:  never Blood Transfusions:  no  Review of Systems       The patient complains of severe indigestion/heartburn.  The patient denies anorexia, fever, weight loss, weight gain, hoarseness, chest pain, syncope,  dyspnea on exertion, peripheral edema, prolonged cough, headaches, hemoptysis, abdominal pain, melena, hematochezia, hematuria, suspicious skin lesions, abnormal bleeding, enlarged lymph nodes, and angioedema.    Physical Exam  General:  Well developed, well nourished, in no acute distress. Head:  normocephalic and atraumatic Mouth:  Gums and palate normal. Oral mucosa normal. Neck:  Neck supple, no JVD. No masses, thyromegaly or abnormal cervical nodes. Lungs:  Clear bilaterally to auscultation and percussion. Heart:  Non-displaced PMI, chest non-tender; regular rate and rhythm, S1, S2 without murmurs, rubs or gallops. Carotid upstroke normal, no bruit. Normal abdominal aortic size, no bruits. Femorals normal pulses, no bruits. Pedals normal pulses. No edema, no varicosities. Abdomen:  Bowel sounds positive; abdomen soft and non-tender without masses, organomegaly, or hernias noted. No hepatosplenomegaly. Msk:  Back normal, normal gait. Muscle strength and tone normal. Pulses:  R and L carotid,radial,femoral,dorsalis pedis and posterior tibial pulses are full and equal bilaterally Extremities:  No clubbing, cyanosis, edema, or deformity noted with normal full range of motion of all joints.   Neurologic:  No cranial nerve deficits noted. Station and gait are normal. Plantar reflexes are down-going bilaterally. DTRs are symmetrical throughout. Sensory, motor and coordinative functions appear intact. Skin:  turgor normal, color normal, no rashes, no suspicious lesions, no ecchymoses, no petechiae, no purpura, no ulcerations, and no edema.   Cervical Nodes:  No lymphadenopathy noted Axillary Nodes:  No palpable lymphadenopathy Psych:  Oriented X3, memory intact for recent and remote, normally interactive, good eye contact, not anxious appearing, not depressed appearing, not agitated, not suicidal, not homicidal, easily distracted, poor concentration, and judgment fair.     Impression &  Recommendations:  Problem # 1:  GERD (ICD-530.81) Assessment Deteriorated  Her updated medication list for this problem includes:    Prevacid 30 Mg Cpdr (Lansoprazole) .Marland Kitchen... 1 once daily  Labs Reviewed: Hgb: 12.6 (06/24/2010)   Hct: 36.9 (06/24/2010)  Problem # 2:  HYPERTENSION, UNSPECIFIED (ICD-401.9) Assessment: Improved  Her updated medication list for this problem includes:    Toprol Xl 25 Mg Tb24 (Metoprolol succinate) .Marland Kitchen... 1 once daily  BP today: 112/62 Prior BP: 128/72 (06/22/2010)  Labs Reviewed: K+: 4.0 (06/24/2010) Creat: : 0.9 (06/24/2010)   Chol: 176 (06/24/2010)   HDL: 59.00 (06/24/2010)   LDL: 106 (06/24/2010)   TG: 55.0 (06/24/2010)  Complete Medication List: 1)  Prevacid 30 Mg Cpdr (Lansoprazole) .Marland Kitchen.. 1 once daily 2)  Toprol Xl 25 Mg Tb24 (Metoprolol succinate) .Marland Kitchen.. 1 once daily 3)  Niacin Cr 500 Mg Cpcr (Niacin) .... 1/2  at morning and one atbedtime 4)  Vitamin D 400 Unit Tabs (Cholecalciferol) .... Take as needed 5)  Fish Oil Oil (Fish oil) .Marland Kitchen.. 1 once daily 6)  Cvs Selenium 200 Mcg Tabs (Selenium) .Marland Kitchen.. 1 once daily 7)  Folic Acid Powd (Folic acid) .Marland Kitchen.. 1 once daily 8)  Ear Drops 1.4-5.4 % Soln (Benzocaine-antipyrine) .Marland Kitchen.. 1-2 drops in ear two times a day  as needed 9)  Proair Hfa 108 (90 Base) Mcg/act Aers (Albuterol sulfate) .... 2 puffs every 4 hours as needed 10)  Clarinex 5 Mg Tabs (Desloratadine) .Marland Kitchen.. 1 once daily 11)  Flonase 50 Mcg/act Susp (Fluticasone propionate) .... Use as directed 12)  Calcium-magnesium-zinc 333-133-8.3 Mg Tabs (Calcium-magnesium-zinc) .... Take 1 by mouth once daily 13)  Strontium Chloride Crys (Strontium chloride) .... Take 1 by mouth once daily 14)  Super K With Advanced K2 Complex  .... Take 1 by mouth once daily 15)  Omega Xl  .Marland Kitchen.. 1 by mouth daily  Hypertension Assessment/Plan:      The patient's hypertensive risk group is category B: At least one risk factor (excluding diabetes) with no target organ damage.  Her  calculated 10 year risk of coronary heart disease is 6 %.  Today's blood pressure is 112/62.  Her blood pressure goal is < 140/90.  Colorectal Screening:  Colonoscopy Results:    Date of Exam: 06/13/2005    Results: Normal  PAP Screening:    Hx Cervical Dysplasia in last 5 yrs? No    3 normal PAP smears in last 5 yrs? Yes    Last PAP smear:  01/11/2010  PAP Smear Results:    Date of Exam:  01/11/2010    Results:  Normal  Mammogram Screening:    Last Mammogram:  09/09/2009  Mammogram Results:    Date of Exam:  09/09/2009    Results:  Normal Bilateral  Osteoporosis Risk Assessment:  Risk Factors for Fracture or Low Bone Density:   Smoking status:       never  Bone Density (DEXA Scan) Results:    Date of Exam:  09/14/2009    Results:  Osteoporosis   Patient Instructions: 1)  Please schedule a follow-up appointment in 1 month. 2)  Avoid foods high in acid (tomatoes, citrus juices, spicy foods). Avoid eating within two hours of lying down or before exercising. Do not over eat; try smaller more frequent meals. Elevate head of bed twelve inches when sleeping. Prescriptions: PREVACID 30 MG  CPDR (LANSOPRAZOLE) 1 once daily  #30 x 11   Entered and Authorized by:   Etta Grandchild MD   Signed by:   Etta Grandchild MD on 06/27/2010   Method used:   Electronically to        [facility] [phone]* (retail)       [address]       [place], [place]  [address]       Ph: [phone]       Fax: [phone]   RxID:   [id number]    Orders Added: 1)  New Patient Level IV [72536]

## 2010-07-19 LAB — URINALYSIS, ROUTINE W REFLEX MICROSCOPIC
Bilirubin Urine: NEGATIVE
Ketones, ur: NEGATIVE mg/dL
Nitrite: NEGATIVE
Urobilinogen, UA: 0.2 mg/dL (ref 0.0–1.0)

## 2010-07-19 LAB — URINE MICROSCOPIC-ADD ON

## 2010-07-19 NOTE — Letter (Signed)
Summary: [facility]   Imported By: Sherian Rein 07/13/2010 12:52:47  _____________________________________________________________________  External Attachment:    Type:   Image     Comment:   External Document

## 2010-07-26 ENCOUNTER — Encounter: Payer: Self-pay | Admitting: Internal Medicine

## 2010-07-26 ENCOUNTER — Ambulatory Visit (INDEPENDENT_AMBULATORY_CARE_PROVIDER_SITE_OTHER): Payer: Medicare Other | Admitting: Internal Medicine

## 2010-07-26 VITALS — BP 136/70 | HR 60 | Temp 97.6°F | Resp 16 | Ht 61.0 in | Wt 150.0 lb

## 2010-07-26 DIAGNOSIS — M7711 Lateral epicondylitis, right elbow: Secondary | ICD-10-CM

## 2010-07-26 DIAGNOSIS — M7710 Lateral epicondylitis, unspecified elbow: Secondary | ICD-10-CM

## 2010-07-26 NOTE — Progress Notes (Signed)
Subjective:    Brianna Mcdonald is a 77 y.o. female who presents with right elbow pain. Onset of the symptoms was several weeks ago. Inciting event: none known. Current symptoms include: point tenderness over the lateral elbow. Pain is aggravated by: grasping, supination/pronation as when opening doors. Symptoms have gradually improved. Patient has had no prior elbow problems. Evaluation to date: none. Treatment to date: OTC analgesics.  The following portions of the patient's history were reviewed and updated as appropriate: allergies, current medications, past family history, past medical history, past social history, past surgical history and problem list.  Review of Systems Musculoskeletal:positive for elbow pain, negative for arthralgias, back pain, muscle weakness, myalgias, neck pain and stiff joints   Objective:    There were no vitals taken for this visit. Right elbow: without deformity, full active ROM and tenderness over lateral epicondyle  Left elbow:  without deformity     Assessment:    right lateral epicondylitis    Plan:    Rest, ice, compression, and elevation (RICE) therapy. OTC analgesics as needed. Follow-up in 4 weeks.

## 2010-07-26 NOTE — Patient Instructions (Signed)
Tennis Elbow Your caregiver has diagnosed you with a condition often referred to as "Tennis Elbow." This results from small tears or soreness (inflammation) at the start (origin) of the extensor muscles of the forearm. Although the condition is often called tennis or golfer's elbow, it is caused by any repetitive action performed by your elbow. HOME CARE INSTRUCTIONS  If the condition has been short lived, rest may be the only treatment required. Using your opposite hand or arm to perform the task may help. Even changing your grip may help rest the extremity. These may even prevent the condition from recurring.   Longer standing problems, however, will often be relieved faster by:   Using anti-inflammatory agents.   Applying ice packs for 30 minutes at the end of the working day, at bed time, or when activities are finished.   Your caregiver may also have you wear a splint or sling. This will allow the inflamed tendon to heal.  At times, steroid injections aided with a local anesthetic will be required along with splinting for 1 to 2 weeks. Two to three steroid injections will often solve the problem. In some long standing cases, the inflamed tendon does not respond to conservative (non-surgical) therapy. Then surgery may be required to repair it. MAKE SURE YOU:   Understand these instructions.   Will watch your condition.   Will get help right away if you are not doing well or get worse.  Document Released: 04/17/2005 Document Re-Released: 07/14/2008 [facility] Patient Information 2011 [place], [place].

## 2010-07-26 NOTE — Assessment & Plan Note (Signed)
This is improving with tylenol, she has had no injury and the exam does not indicate a need for xray, I will offer pt educ material and offer PT if she does not continue to improve

## 2010-08-04 ENCOUNTER — Other Ambulatory Visit: Payer: Self-pay | Admitting: Gastroenterology

## 2010-08-04 ENCOUNTER — Ambulatory Visit (HOSPITAL_COMMUNITY)
Admission: RE | Admit: 2010-08-04 | Discharge: 2010-08-04 | Disposition: A | Discharge status: Home / Self Care | Payer: Medicare Other | Source: Ambulatory Visit | Attending: Gastroenterology | Admitting: Gastroenterology

## 2010-08-04 DIAGNOSIS — J4489 Other specified chronic obstructive pulmonary disease: Secondary | ICD-10-CM | POA: Insufficient documentation

## 2010-08-04 DIAGNOSIS — M810 Age-related osteoporosis without current pathological fracture: Secondary | ICD-10-CM | POA: Insufficient documentation

## 2010-08-04 DIAGNOSIS — K5289 Other specified noninfective gastroenteritis and colitis: Secondary | ICD-10-CM | POA: Insufficient documentation

## 2010-08-04 DIAGNOSIS — K219 Gastro-esophageal reflux disease without esophagitis: Secondary | ICD-10-CM | POA: Insufficient documentation

## 2010-08-04 DIAGNOSIS — L539 Erythematous condition, unspecified: Secondary | ICD-10-CM | POA: Insufficient documentation

## 2010-08-04 DIAGNOSIS — I059 Rheumatic mitral valve disease, unspecified: Secondary | ICD-10-CM | POA: Insufficient documentation

## 2010-08-04 DIAGNOSIS — R002 Palpitations: Secondary | ICD-10-CM | POA: Insufficient documentation

## 2010-08-04 DIAGNOSIS — J449 Chronic obstructive pulmonary disease, unspecified: Secondary | ICD-10-CM | POA: Insufficient documentation

## 2010-08-04 DIAGNOSIS — Z79899 Other long term (current) drug therapy: Secondary | ICD-10-CM | POA: Insufficient documentation

## 2010-08-04 DIAGNOSIS — Z7982 Long term (current) use of aspirin: Secondary | ICD-10-CM | POA: Insufficient documentation

## 2010-08-04 DIAGNOSIS — E785 Hyperlipidemia, unspecified: Secondary | ICD-10-CM | POA: Insufficient documentation

## 2010-08-04 DIAGNOSIS — R0902 Hypoxemia: Secondary | ICD-10-CM | POA: Insufficient documentation

## 2010-08-21 NOTE — Op Note (Signed)
  NAME:  Brianna, Mcdonald NO.:  [id number]  MEDICAL RECORD NO.:  [id number]           PATIENT TYPE:  O  LOCATION:  [facility]                         FACILITY:  [facility]  PHYSICIAN:  Shirley Friar, MDDATE OF BIRTH:  09/18/1932  DATE OF PROCEDURE: DATE OF DISCHARGE:                              OPERATIVE REPORT   INDICATIONS:  Colitis.  MEDICATIONS: 1. Fentanyl 50 mcg IV. 2. Versed 2 mg IV. 3. Propofol 120 mg IV. 4. Ketamine 20 mg IV, all by Anesthesia.  FINDINGS:  Rectal exam was unremarkable.  A pediatric colonoscope was inserted into an adequately prepped and well prepped colon and advanced to the cecum where the ileocecal valve and appendiceal orifice was identified.  On careful withdrawal of the colonoscope, the proximal and mid part and distal part of the colon were unremarkable except in the rectum where there was nonspecific erythema in the distal rectum.  Two biopsies were taken in the distal rectum for histologic purposes.  No other mucosal abnormalities were seen.  During withdrawal from the colon, the patient developed hypoxia with what appeared be laryngeal spasm and her O2 sats dropped into the 60s.  She was ventilated with a bag mask by Anesthesia and decision was made to not do the upper endoscopy.  Her O2 sats improved with bag mask ventilation and oral airway.  ASSESSMENT: 1. Nonspecific erythema in rectum status post biopsies for histologic     purposes. 2. Otherwise normal colonoscopy.  PLAN:  Follow up on path.     Shirley Friar, MD     VCS/MEDQ  D:  08/04/2010  T:  08/04/2010  Job:  [id number]  cc:   Osborn Coho, M.D. Fax: [phone]  Electronically Signed by Charlott Rakes MD on 08/21/2010 11:42:57 AM

## 2010-08-23 ENCOUNTER — Encounter: Payer: Self-pay | Admitting: Internal Medicine

## 2010-08-23 ENCOUNTER — Ambulatory Visit (INDEPENDENT_AMBULATORY_CARE_PROVIDER_SITE_OTHER): Payer: Medicare Other | Admitting: Internal Medicine

## 2010-08-23 VITALS — BP 140/80 | HR 72 | Temp 98.4°F | Resp 16 | Wt 153.0 lb

## 2010-08-23 DIAGNOSIS — G4730 Sleep apnea, unspecified: Secondary | ICD-10-CM

## 2010-08-23 DIAGNOSIS — J309 Allergic rhinitis, unspecified: Secondary | ICD-10-CM

## 2010-08-23 DIAGNOSIS — I10 Essential (primary) hypertension: Secondary | ICD-10-CM

## 2010-08-23 NOTE — Patient Instructions (Signed)
Sleep Apnea     Sleep apnea is a common disorder. The main problem of this disorder is excessive daytime sleepiness and compromised quality of life. This includes social and emotional problems. There are two types of sleep apnea.    Obstructive sleep apnea is when breathing stops due to a blocked airway.        Central sleep apnea is a malfunction of the brain's normal signal to breathe.      SYMPTOMS OF SLEEP APNEA MAY INCLUDE:    Restless sleep.    Falling asleep while driving and/or during the day.   Loss of energy.   Irritability.   Mood or behavior changes.  Loud, heavy snoring.    Morning headaches.   Trouble concentrating.   Forgetfulness.   Anxiety or depression.   Decreased interest in sex.      Not all people with sleep apnea have all of these symptoms. However, people who have a few of these symptoms should visit their caregiver for an evaluation. Problems related to untreated sleep apnea include:   High blood pressure (hypertension).    Coronary artery disease.   Impotence.  Cognitive dysfunction.    Memory loss.       TREATMENT   For mild cases, treatment may include avoiding sleeping on one's back.    For people with nasal congestion, a decongestant may be prescribed.    Patients with obstructive and central apnea should avoid depressants. This includes alcohol, sedatives and narcotics. Weight loss and diet control are encouraged for overweight patients.    Many serious cases of obstructive sleep apnea can be relieved by a treatment called nasal continuous positive airway pressure (nasal CPAP). Nasal CPAP uses a mask-like device and pump that work together to keep the airway open. The pump delivers air pressure during each breath. Surgery may help some patients by stopping or reducing the narrowing of the airway due to anatomical defects.     PROGNOSIS   Removing the obstruction usually reverses hypertension and cardiac problems. Untreated, sleep apnea sufferers have a tendency to fall asleep during the day. This is can result in serious accident or loss of ones job.     RESEARCH BEING DONE  Sleep apnea is currently one of the most active areas of sleep research.      Document Released: 04/07/2002  Document Re-Released: 01/25/2008  [employer] Patient Information 2011 [employer].

## 2010-08-23 NOTE — Progress Notes (Signed)
Subjective:    Patient ID: Brianna Mcdonald, female    DOB: Apr 23, 1933, 77 y.o.   MRN: [id number]  HPI She returns for f/up and she tells me that she was told by Dr. Bosie Clos that she had an episode of apnea during recent endoscopy. She does not have a bed partner so she is not able to know if she has snoring or apnea. She offers no complaints today but she seems to be very upset about this news and she "wants some more info and for it to be investigated." She tells me that her elbow pain has resolved.    Review of Systems  Constitutional: Negative for fever, chills, diaphoresis, activity change, appetite change, fatigue and unexpected weight change.  HENT: Negative for facial swelling, neck pain and neck stiffness.   Respiratory: Positive for apnea. Negative for cough, choking, chest tightness, shortness of breath, wheezing and stridor.   Cardiovascular: Negative for chest pain, palpitations and leg swelling.  Gastrointestinal: Negative for nausea, vomiting, abdominal pain, diarrhea, constipation, abdominal distention and anal bleeding.  Genitourinary: Negative for dysuria, urgency, frequency, hematuria, flank pain, decreased urine volume, difficulty urinating and dyspareunia.  Musculoskeletal: Negative for myalgias, back pain, joint swelling, arthralgias and gait problem.  Skin: Negative for color change, pallor and rash.  Neurological: Negative for dizziness, tremors, seizures, syncope, facial asymmetry, speech difficulty, weakness, light-headedness, numbness and headaches.  Hematological: Negative for adenopathy. Does not bruise/bleed easily.  Psychiatric/Behavioral: Negative for hallucinations, behavioral problems, confusion, self-injury, dysphoric mood, decreased concentration and agitation. The patient is not nervous/anxious.        Objective:   Physical Exam  Vitals reviewed. Constitutional: She is oriented to person, place, and time. She appears well-developed and well-nourished.  No distress.  HENT:  Head: Normocephalic and atraumatic.  Right Ear: External ear normal.  Left Ear: External ear normal.  Nose: Nose normal.  Mouth/Throat: Oropharynx is clear and moist. No oropharyngeal exudate.  Eyes: Conjunctivae and EOM are normal. Pupils are equal, round, and reactive to light. Right eye exhibits no discharge. Left eye exhibits no discharge. No scleral icterus.  Neck: Normal range of motion. Neck supple. No JVD present. No tracheal deviation present. No thyromegaly present.  Cardiovascular: Normal rate, regular rhythm, normal heart sounds and intact distal pulses.  Exam reveals no gallop and no friction rub.   No murmur heard. Pulmonary/Chest: Effort normal and breath sounds normal. No stridor. No respiratory distress. She has no wheezes. She has no rales. She exhibits no tenderness.  Abdominal: Soft. Bowel sounds are normal. She exhibits no distension and no mass. There is no tenderness. There is no rebound and no guarding.  Musculoskeletal: Normal range of motion. She exhibits no edema and no tenderness.  Lymphadenopathy:    She has no cervical adenopathy.  Neurological: She is alert and oriented to person, place, and time. She has normal reflexes. She displays normal reflexes. No cranial nerve deficit. She exhibits normal muscle tone. Coordination normal.  Skin: Skin is warm and dry. No rash noted. She is not diaphoretic. No erythema. No pallor.  Psychiatric: She has a normal mood and affect. Her behavior is normal. Judgment and thought content normal.        Lab Results  Component Value Date   WBC 3.5* 06/24/2010   HGB 12.6 06/24/2010   HCT 36.9 06/24/2010   PLT 143.0* 06/24/2010   CHOL 176 06/24/2010   TRIG 55.0 06/24/2010   HDL 59.00 06/24/2010   LDLDIRECT 121.4 04/22/2007   ALT 28  06/24/2010   AST 35 06/24/2010   NA 141 06/24/2010   K 4.0 06/24/2010   CL 105 06/24/2010   CREATININE 0.9 06/24/2010   BUN 10 06/24/2010   CO2 28 06/24/2010   TSH 1.13 06/24/2010     Assessment & Plan:

## 2010-08-24 NOTE — Assessment & Plan Note (Signed)
Sleep referral done

## 2010-08-24 NOTE — Assessment & Plan Note (Signed)
BP is well controlled 

## 2010-08-31 ENCOUNTER — Other Ambulatory Visit: Payer: Self-pay | Admitting: Internal Medicine

## 2010-09-05 ENCOUNTER — Telehealth: Payer: Self-pay | Admitting: Internal Medicine

## 2010-09-05 ENCOUNTER — Other Ambulatory Visit: Payer: Self-pay | Admitting: Internal Medicine

## 2010-09-05 NOTE — Telephone Encounter (Signed)
Patient returning call.

## 2010-09-05 NOTE — Telephone Encounter (Signed)
ATC pt back at number listed and it states the number I have reached has been disconnected. Still no number listed to call pt back. Will have to await call back.

## 2010-09-05 NOTE — Telephone Encounter (Signed)
ATC. States the number I have reached has been disconnected. No other number in pt's chart to call. Will await pt call back

## 2010-09-06 NOTE — Telephone Encounter (Signed)
Pt called back this morning and spoke with Melanie-pt is aware that I am taking care of the refill now.

## 2010-09-13 NOTE — Assessment & Plan Note (Signed)
[facility]                            CARDIOLOGY OFFICE NOTE   Brianna Mcdonald, Brianna Mcdonald                   MRN:          [id number]  DATE:05/07/2008                            DOB:          [birth year]    PRIMARY CARE PHYSICIAN:  Deirdre Peer. Polite, MD.   REASON FOR PRESENTATION:  Evaluate the patient with chest discomfort and  palpitations.   HISTORY OF PRESENT ILLNESS:  The patient is a pleasant 77 year old  African American female with the above complaints.  Since I last saw  her, she has had no new palpitations.  She did not have any presyncope  or syncope.  She states she is still bothered by bronchitis.  She does  get some chest discomfort.  However, this seems to be a discomfort  associated with certain movements where pressing on her chest.  She does  not describe any symptoms that she can bring on with activities that are  consistent with unstable angina.  She does not have any substernal chest  pressure.   PAST MEDICAL HISTORY:  Palpitations with premature ventricular  contractions, borderline hypertension, dyslipidemia, appendectomy, D and  C x3, and wrist surgery.   ALLERGIES/INTOLERANCE:  SULFA AND PENICILLIN.   MEDICATIONS:  1. Prevacid 30 mg daily.  2. Metoprolol 25 mg daily.  3. Clarinex.  4. Magnesium 40 mg b.i.d.  5. Niacin 250 mg q.a.m. and 500 mg q.p.m.  6. Calcium with vitamin D.  7. Omega 3.  8. Selenium.  9. Folic acid.   REVIEW OF SYSTEMS:  As stated in the HPI and otherwise negative for  other systems.   PHYSICAL EXAMINATION:  GENERAL:  The patient is pleasant and in no  distress.  VITAL SIGNS:  Blood pressure 127/66, heart rate 55 and regular, and  weight 154 pounds.  HEENT:  Eyelids unremarkable.  Pupils equal, round, react to light.  Fundi not visualized.  Oral mucosa unremarkable.  NECK:  No jugular venous distention at 45 degrees.  Carotid upstroke  brisk and symmetric.  No bruits.  No thyromegaly.  LYMPHATICS:  No adenopathy.  LUNGS:  Clear to auscultation bilaterally.  CHEST:  Unremarkable.  HEART:  PMI not displaced or sustained.  S1 and S2 within normal limits.  No S3, no S4.  No clicks.  No rubs.  No murmurs.  ABDOMEN:  Flat.  Positive bowel sounds, normal in frequency and pitch.  No bruits.  No rebound.  No guarding.  No midline pulsatile mass.  No  hepatomegaly.  No splenomegaly.  SKIN:  No rashes, no nodules.  EXTREMITIES:  Pulses 2+.  No edema.   ASSESSMENT AND PLAN:  1. Chest discomfort.  The patient's chest discomfort is very atypical.      At this point, no further cardiovascular testing is suggested.  She      will continue with primary risk reduction.  2. Palpitations.  These were not particularly symptomatic.  She is      tolerating the low dose of beta blocker and this could be titrated      upward if she has continued palpitations.  3. Hypertension.  Blood pressure is controlled.  She will continue on      the medicines as listed.  4. Bronchitis per Dr. Maple Hudson.  5. Followup.  I will see her back as needed.     Rollene Rotunda, MD, [facility]  Electronically Signed    JH/MedQ  DD: 05/07/2008  DT: 05/08/2008  Job #: [id number]   cc:   Deirdre Peer. Polite, M.D.

## 2010-09-13 NOTE — Assessment & Plan Note (Signed)
[facility]                            CARDIOLOGY OFFICE NOTE   Brianna Mcdonald, Brianna Mcdonald                   MRN:          [id number]  DATE:08/14/2007                            DOB:          [birth year]    REASON FOR PRESENTATION:  Evaluate patient's palpitations.   HISTORY OF PRESENT ILLNESS:  The patient 77 year old African American  female who has a history of palpitations.  She was followed very closely  by cardiologist before moving down from [place] last year.  She is not  having any further palpitations.  She is not describing any presyncope  or syncope.  She has been having some problems with cough and allergy.  She has been having lots of orthopedic problems and is following with  Dr. Priscille Kluver.   PAST MEDICAL HISTORY:  1. Premature ventricular contractions.  2. Borderline hypertension.  3. Dyslipidemia.  4. Appendectomy.  5. D&C x3.  6. Wrist surgery.   ALLERGIES:  SULFA AND PENICILLIN.   MEDICATIONS:  1. Prevacid 30 mg.  2. Toprol 25 mg daily.  3. Clarinex.  4. Magnesium.  5. Niacin 250 mg q.a.m. and 500 mg q.p.m.  6. Calcium.  7. Vitamin E.  8. Omega-3.  9. Selenium.  10.Folic acid.   REVIEW OF SYSTEMS:  As stated in HPI otherwise negative for other  systems.   PHYSICAL EXAMINATION:  GENERAL:  The patient is in no distress.  VITAL SIGNS:  Blood pressure 139/71, heart rate 56 and regular, weight  154 pounds, body mass index 25.  NECK:  No jugular distention 45 degrees, carotid upstroke brisk and  symmetric.  LUNGS:  Clear to auscultation bilaterally.  HEART:  PMI not displaced or sustained, S1-S2 within normal limits, no  S3, no S4, no clicks, rubs, no murmurs.  ABDOMEN:  Flat, positive bowel sounds.  Normal in frequency and pitch,  no bruits, rebound, guarding, no midline pulsatile mass, no  organomegaly.  SKIN:  No rashes.  EXTREMITIES:  Pulses 2+, trace bilateral lower extremity edema.   STUDIES:  EKG sinus  bradycardia, rate 57, axis within normal.  Intervals  within normal limits, no acute ST-wave changes.   ASSESSMENT/PLAN:  1. Palpitations.  These are not particular problematic for the patient      at present.  She still wants close follow-up.  I will make no      change in her medical regimen.  2. Lower extremity swelling.  This is mild.  I have asked her to avoid      salt and keep her feet elevated.  She wants to avoid multiple      medications.  3. Allergies.  I have suggested the name of an allergist, and she will      call to try to establish.  4. Hypertension.  She reports she has never really had a problem with      this, though it is been list is borderline hypertension.  She is      pre hypertensive today, but will remain on the medicines as listed      and avoid the salt.  5. Follow-up.  Per her request, she will be seen in 4 months.     Rollene Rotunda, MD, [facility]  Electronically Signed    JH/MedQ  DD: 08/14/2007  DT: 08/14/2007  Job #: [id number]

## 2010-09-13 NOTE — Assessment & Plan Note (Signed)
[facility]                            CARDIOLOGY OFFICE NOTE   BROOKLYNNE, PEREIDA                     MRN:          [id number]  DATE:04/18/2007                            DOB:          1933-01-07    REASON FOR REFERRAL:  The patient is a self referral for evaluation of  palpitations and hypertension   HISTORY OF PRESENT ILLNESS:  The patient is a 77 year old African-  American female who is moving from [place].  She is re-establishing.  She was seen frequently by cardiologist for evaluation of palpitations.  She liked to be seen every 4 months for this.  She had palpitations and  PVCs documented that this was only 4 out of 87,000 on a monitor.  In  2007, she had a negative Myoview with an EF of 58%.  She has had  echocardiograms.  She reports mitral valve prolapse, but the  echocardiogram from 2007 demonstrated only mild AR, MR, and TR.  Her EF  was 50% to 55%.  She also reports hypertension.  The blood pressure and  the PVCs were managed with Toprol.   She is moving down here and wants to re-establish.  She has not been  getting any palpitations recently.  She has been active moving her house  and setting up the new one.  She denies any chest pressure, neck or arm  discomfort.  She has had no new shortness of breath and denies any PND  or orthopnea.  She has had no palpitation on presyncope or syncope.   PAST MEDICAL HISTORY:  Premature ventricular contractions, borderline  hypertension. Dyslipidemia.   PAST SURGICAL HISTORY:  Appendectomy, D and C x3.  Wrist surgery.   ALLERGIES:  SULFA AND PENICILLIN.   MEDICATIONS:  1. Prevacid 30 mg daily.  2. Toprol 25 mg daily.  3. Niacin 250 mg q.a.m. and 500 mg p.o. at bedtime.   SOCIAL HISTORY:  The patient is retired.  She worked at [employer].  She is a widow.  She has 1 child who moved down her with her.  She does not smoke cigarettes and does not drink alcohol.   FAMILY HISTORY:   Noncontributory for early coronary artery disease.   REVIEW OF SYSTEMS:  Positive for lightheadedness, occasionally; reflux,  back pain.  Negative for all other systems.   PHYSICAL EXAMINATION:  The patient is in no distress.  Blood pressure 117/59.  Heart rate 59 and regular.  HEENT:  Eyes unremarkable.  Pupils equal, round and react to light.  Fundi within normal limits, oral mucosa unremarkable.  NECK:  No jugular venous distension at 45 degrees.  Carotid upstroke  brisk and symmetrical.  No bruits, no thyromegaly.  LYMPHATICS:  No cervical, axillary, or inguinal adenopathy.  LUNGS:  Clear to auscultation bilaterally.  BACK:  No costovertebral angle tenderness.  CHEST:  Unremarkable.  HEART:  PMI not displaced or sustained.  S1 and S2 within normal limits.  No S3, no S4.  No clicks, rubs, murmurs  ABDOMEN:  Flat, positive bowel sounds, normal frequency and  pitch.  No  bruits., rebound,  guarding.  No midline pulsatile mass, no  hepatosplenomegaly.  SKIN:  No rashes, no nodules.  EXTREMITIES:  2+ pulses throughout.  No edema.  No cyanosis, or  clubbing.  NEUROLOGIC:  Oriented to person, place, and time.  Cranial nerves 2-12  grossly intact.  Motor grossly intact.   EKG, sinus bradycardia, rate 59.  Axis within normal limits.  Intervals  within normal limits.  No acute ST or T wave  changes.   ASSESSMENT AND PLAN:  1. Palpitations.  The patient has occasional premature ventricular      contractions.  These seem to be treated with a beta blocker.  She      seems to be quite anxious about her cardiac status and really wants      to be seen every 4 months.  I tried to talk her down to once a      year.  However, she states she has to present anyway with      complaints and so I will go ahead and schedule her 4 months.  No      further cardiovascular testing is planned at this point.  2. Dyslipidemia.  She is on an odd regimen of Niaspan twice a day.  I      will go ahead and  check a lipid profile, and she can get liver      enzymes when she comes back fasting.  3. Hypertension.  This does not appear to be a problem.  Blood      pressure is well controlled.  She will continue her medications as      listed.  4. Mitral valve prolapse.  I do not see any evidence of this on the      recent echocardiogram. Her physical examination is normal.  No      further cardiovascular testing is suggested.  5. Followup.  I will see her again per her request in 4 months.     Rollene Rotunda, MD, [facility]  Electronically Signed    JH/MedQ  DD: 04/18/2007  DT: 04/19/2007  Job #: [id number]

## 2010-09-13 NOTE — Assessment & Plan Note (Signed)
[facility]                         GASTROENTEROLOGY OFFICE NOTE   RALEY, NOVICKI                   MRN:          [id number]  DATE:07/19/2007                            DOB:          03/13/1933    GI PROBLEM LIST:  Constipation-predominant irritable bowel syndrome,  status post multiple colonoscopies in [place] and [place].  Most  recent in September, 2007, [place].  Colonoscopy describes some mild  nonspecific proctitis, although biopsies from here were normal.  Biopsies of the terminal ileum were also normal.  CT scan done in July,  2007, [place], showed some small attenuated lesions of the liver  suggestive of cyst.   LABS:  In February, 2009, normal CBC, normal sed rate, normal complete  metabolic profile except for an albumin of 3.4.  Normal TSH.   INTERVAL HISTORY:  I last saw Brianna Mcdonald about one month ago.  Since then, she  has started taking Metamucil powder on a daily basis.  She has noticed a  definite improvement in her right lower quadrant discomfort, that was  the main reason she was seeing me last month.  She is taking about one  scoop a day, five days out of seven, and on this regimen, she has had no  abdominal pain.  She still has two rather scybalous stools daily.  She  has had no overt GI bleeding.   I repeated a CT scan.  I did not mention this above, but it showed what  was essentially normal, other than some small density lesions of the  liver measuring up to 1 cm.  This is a noncontrast scan, so they could  not say if these were small hepatic cysts, but she did have a CT scan in  2007 that was contrasted, and they were able to say definitively that  these were cysts, so I am not concerned about these.  She had a fibroid  in her anterior uterus as well.   CURRENT MEDICATIONS:  Prevacid, Toprol XL, Clarinex, magnesium, niacin,  calcium, vitamin D, omega 3, Metamucil.   PHYSICAL EXAMINATION:  Weight 154 pounds, which  is up 4 pounds since her  last visit.  Blood pressure 110/68, pulse 56.  CONSTITUTIONAL:  Generally well-appearing.  ABDOMEN:  Soft, nontender, nondistended.  Normal bowel sounds.   ASSESSMENT:  A 77 year old woman with likely mild constipation-  predominant irritable bowel syndrome.  No alarm symptoms.  Fairly  unrevealing colonoscopy in [place] in 2007.   PLAN:  I recommend that she stay on the Metamucil on a daily basis, and  she will return to see me in 2-3 months and sooner if needed.  I see no  reason for any further testing at this point.     Rachael Fee, MD  Electronically Signed   DPJ/MedQ  DD: 07/19/2007  DT: 07/19/2007  Job #: [id number]

## 2010-09-13 NOTE — Assessment & Plan Note (Signed)
[facility]                            CARDIOLOGY OFFICE NOTE   Brianna Mcdonald, Brianna Mcdonald                   MRN:          [id number]  DATE:12/02/2007                            DOB:          09-12-1932    REASON FOR PRESENTATION:  Evaluate the patient with palpitations and  hypertension.   HISTORY OF PRESENT ILLNESS:  The patient returns for followup of the  above.  She has been battling bronchitis and recently has seen Dr. Maple Hudson  about this.  She does not think this has resolved yet.  She is having  difficulty coughing stuff up.  She actually gets nauseated when she  coughs a lot.  She has had some laryngitis.  She has had some dyspnea  associated with this.  However, she has not had any overt cardiac  complaints.  In particularly, she has not been having any palpitations,  presyncope, or syncope.  She has been having no PND or orthopnea.  She  has had no lower extremity swelling.   PAST MEDICAL HISTORY:  Palpitations with premature ventricular  contractions, borderline hypertension with dyslipidemia, appendectomy,  D&C x3, and wrist surgery.   ALLERGIES:  SULFA and PENICILLIN.   MEDICATIONS:  1. Prevacid 30 mg daily.  2. Toprol 25 mg daily.  3. Clarinex 5 mg daily.  4. Magnesium.  5. Niacin.  6. Calcium.  7. Vitamin D.  8. Omega-3.  9. Selenium 200 mg daily.  10.Folic acid.  11.Fish oil.   REVIEW OF SYSTEMS:  As stated in the HPI and otherwise negative for  other systems.   PHYSICAL EXAMINATION:  GENERAL:  The patient is in no acute distress.  VITAL SIGNS:  Blood pressure 126/69, heart rate 65 and regular, and  weight 149 pounds.  HEENT:  Eyelids are unremarkable; pupils equal, round, and reactive to  light; fundi not visualized; oral mucosa unremarkable.  NECK:  No jugular venous distention at 45 degrees; carotid upstrokes  brisk and symmetric; no bruits, no thyromegaly.  LUNGS:  Clear to auscultation bilaterally.  BACK:  No  costovertebral angle tenderness.  CHEST:  Unremarkable.  HEART:  PMI not displaced or sustained; S1 and S2 within normal limits.  No S3, no S4, no clicks, rubs, or murmurs.  ABDOMEN:  Obese; positive bowel sounds; normal in frequency and pitch;  no bruits, rebound, guarding or midline pulsatile mass.  No  organomegaly.  SKIN:  No rashes; no nodules.  EXTREMITIES:  A 2+ pulses; no edema.   EKG, sinus rhythm, rate 60, axis within normal limits, intervals within  normal limits, early transition in lead V2, no acute ST-T wave changes.   ASSESSMENT AND PLAN:  1. Hypertension.  Blood pressure is well controlled.  She will      continue the medications as listed.  2. Palpitations.  She is not having any of these.  No further      evaluation is warranted.  3. Swelling.  The patient has complained of this in the past, but      there is no evidence of lower extremity swelling.  4. Bronchitis per Dr. Maple Hudson.  5. Followup.  I will see her again in 6 months.     Rollene Rotunda, MD, [facility]  Electronically Signed    JH/MedQ  DD: 12/02/2007  DT: 12/03/2007  Job #: [id number]

## 2010-09-13 NOTE — Assessment & Plan Note (Signed)
[facility]                         GASTROENTEROLOGY OFFICE NOTE   Brianna Mcdonald, Brianna Mcdonald                   MRN:          [id number]  DATE:06/07/2007                            DOB:          [birth year]    Patient was self-referred.  Her primary care physician is at [facility].  She does not recall his name and she just met him  yesterday.   HPI:  History of colitis, history of right lower quadrant pain.   Brianna Mcdonald is a pleasant, 77 year old woman, who has had right  lower quadrant pains for at least ten years.  She lived in [place]  and had this worked up there.  She was told she had some type of a  colitis, based on colonoscopy.  She was on Asacol for some time, but has  not been on any medicines for at least three years.  She brought with  her records from [place], where she lived more recently, and in this is  a copy of a colonoscopy report from September of 2007, done in [place],  describing some nonspecific mild colitis in the rectum, otherwise the  colon and terminal ileum were normal.  Biopsies were done from colon,  the rectum and the terminal ileum.  All these were normal.  No signs of  colitis or Crohn's disease.  She also brought records of a CT scan, done  July of 2007, showing some small attenuation lesions of the liver,  suggestive of cysts.   She has one to two scybalous type stools a day.  She does have right  lower quadrant pains that are intermittent.  They seem to be better when  she moves her bowels well and worse when she had a lot of scybalous  stools.   REVIEW OF SYSTEMS:  Notable for a ten-pound weight-gain, otherwise  essentially normal, and is available on our nursing intake sheet.   PAST MEDICAL HISTORY:  Status post appendectomy, three wrist surgeries,  three D&Cs, history of heart palpitations, arthritis, hypertension, GERD  symptoms.   CURRENT MEDICATIONS:  Toprol, Prevacid, magnesium,  Clarinex, vitamin D,  omega 3, multivitamin, calcium, D, selenium and niacin.   ALLERGIES:  To PENICILLIN, SULFA, ASPIRIN and AVELOX.   SOCIAL HISTORY:  Widowed, lives with her daughter, nonsmoker,  nondrinker.   FAMILY HISTORY:  No colon cancer in family.   PHYSICAL EXAM:  Patient is 5 feet 1 inch, 150 pounds.  Blood pressure  106/60, pulse 60.  CONSTITUTIONAL:  Generally well-appearing.  NEUROLOGIC:  Alert and oriented times three.  EYES:  Extraocular movements intact.  MOUTH:  Oropharynx moist, no lesions.  NECK:  Supple, no lymphadenopathy.  CARDIOVASCULAR/HEART:  Regular rate and rhythm.  LUNGS:  Clear to auscultation bilaterally.  ABDOMEN:  Soft, nontender, nondistended.  Normal bowel sounds.  EXTREMITIES:  No lower extremity edema.  SKIN:  No rashes or lesions on visible extremities.   ASSESSMENT AND PLAN:  Patient is a 77 year old woman with history of  colitis, chronic right lower quadrant pain.   First, this history of colitis is unclear.  We will work to get records  sent over from her [place] physicians, as that was when the diagnosis  was first made.  She has not been on any kind of specific inflammatory  bowel medicines for the past three years, at least, and has been having  no signs of any active colitis.  She also had a colonoscopy in 2007 and  had no colitis on this exam.  She did have some erythema in her rectum,  but biopsy of this ruled out colitis.  She does have scybalous stools  and some abdominal discomforts and for that, I will get her on fiber  supplement, slowly titrating upward with Citrucel.  We will get a basic  set of labs on her today, including a complete metabolic profile and  thyroid testing and a CBC and also a sed rate.  Lastly, for her right  lower quadrant pain, this is chronic, but she is new to me and she is  somewhat slightly tender in the right lower quadrant.  I think  evaluating her with a CT scan of the abdomen and pelvis with  IV normal  contrast is reasonable.  She will return to see me in six weeks, sooner  if needed.     Rachael Fee, MD  Electronically Signed    DPJ/MedQ  DD: 06/07/2007  DT: 06/07/2007  Job #: [id number]

## 2010-09-14 ENCOUNTER — Ambulatory Visit: Payer: Medicare Other | Admitting: Internal Medicine

## 2010-09-16 ENCOUNTER — Ambulatory Visit (INDEPENDENT_AMBULATORY_CARE_PROVIDER_SITE_OTHER): Payer: Medicare Other | Admitting: Internal Medicine

## 2010-09-16 ENCOUNTER — Encounter: Payer: Self-pay | Admitting: Internal Medicine

## 2010-09-16 VITALS — BP 120/62 | HR 67 | Ht 61.0 in | Wt 150.4 lb

## 2010-09-16 DIAGNOSIS — G4730 Sleep apnea, unspecified: Secondary | ICD-10-CM

## 2010-09-16 DIAGNOSIS — J209 Acute bronchitis, unspecified: Secondary | ICD-10-CM | POA: Insufficient documentation

## 2010-09-16 MED ORDER — DOXYCYCLINE HYCLATE 100 MG PO TABS: ORAL_TABLET | ORAL | Status: DC

## 2010-09-16 NOTE — Assessment & Plan Note (Signed)
She is concerned about possible sleep apnea. We discussed this and will get sleep study. Witnessed apneas by report, during concious sedation for colonoscopy.

## 2010-09-16 NOTE — Patient Instructions (Signed)
Script for doxycycline  For infection  Watch out for reflux from you stomach- don't lie down for 1-2 hours after eating.  Order-  Schedule Split protocol NPSG for dx of obstructive sleep apnea. Check and see if she already has one scheduled.

## 2010-09-16 NOTE — Progress Notes (Signed)
  Subjective:    Patient ID: Brianna Mcdonald, female    DOB: 04/08/1933, 77 y.o.   MRN: [id number]  HPI 09/16/10- 38 yoF followed for allergic rhinitis and bronchitis, complicated by GERD.  Last here November 12, 2009. Had been [facility]. Had reflux there in dental chair, managed in ER. Has not been noting reflux at home at night. Had colonoscopy by Dr Bosie Clos and was told she stopped breathing twice. They asked she get evaluated for sleep apnea. She has a patient information handout on OSA. Husband never told her she snored when he was alive.  Has been more short of breath this week and had some fever and chills yesterday, sore throat, soreness across chest, chest congestion without productive cough.   Review of Systems Constitutional:   No weight loss, night sweats, , chills, fatigue, lassitude. HEENT:   No headaches,  Difficulty swallowing, ,  Sore throat,                No sneezing, itching, ear ache, nasal congestion, post nasal drip,   CV:  No chest pain,  Orthopnea, PND, swelling in lower extremities, anasarca, dizziness, palpitations  GI  No abdominal pain, nausea, vomiting, diarrhea, change in bowel habits, loss of appetite  Resp:  No excess mucus, no productive cough,  No non-productive cough,  No coughing up of blood.  No change in color of mucus.  No wheezing.    Skin: no rash or lesions.  GU: no dysuria, change in color of urine, no urgency or frequency.  No flank pain.  MS:  No joint pain or swelling.  No decreased range of motion.  No back pain.  Psych:  No change in mood or affect. No depression or anxiety.  No memory loss.      Objective:   Physical Exam General- Alert, Oriented, Affect-appropriate, Distress- none acute   Calm  Skin- rash-none, lesions- none, excoriation- none  Lymphadenopathy- none  Head- atraumatic  Eyes- Gross vision intact, PERRLA, conjunctivae clear secretions  Ears- Hearing, canals, Tm - normal  Nose- Clear,  No-  Septal dev, mucus, polyps, erosion, perforation   Throat- Mallampati II , mucosa clear , drainage- none, tonsils- atrophic.  Upper plate, missing lower teeth  Neck- flexible , trachea midline, no stridor , thyroid nl, carotid no bruit  Chest - symmetrical excursion , unlabored     Heart/CV- RRR , no murmur , no gallop  , no rub, nl s1 s2                     - JVD- none , edema- none, stasis changes- none, varices- none     Lung- clear to P&A, wheeze- none, cough- none , dullness-none, rub- none     Chest wall-   Abd- tender-no, distended-no, bowel sounds-present, HSM- no  Br/ Gen/ Rectal- Not done, not indicated  Extrem- cyanosis- none, clubbing, none, atrophy- none, strength- nl  Neuro- grossly intact to observation         Assessment & Plan:

## 2010-09-16 NOTE — Assessment & Plan Note (Addendum)
Acute illness in the past week sounds like bronchitis, apparently impoved today. We will give doxycycline.  Risk of aspiration with her GERD- reflux precautions.

## 2010-09-19 ENCOUNTER — Telehealth: Payer: Self-pay | Admitting: Internal Medicine

## 2010-09-19 NOTE — Telephone Encounter (Signed)
Per CY-tell patient to skip the missed dose and start over again with next dose(due tomorrow)and see what happens.

## 2010-09-19 NOTE — Telephone Encounter (Signed)
Pt says she became nauseated after taking her doxycycline this morning and did not keep it down. She says she took this after breakfast and had no problems with Sat., or Sun. Doses. She is also taking Tussinex for her cough and wonders if this is what made her sick this morning. She has felt fine since the episode this morning. Pls advise. Allergies  Allergen Reactions  . Propofol Anaphylaxis    Stopped breathing  . Aspirin   . Hydrochlorothiazide   . Moxifloxacin   . Penicillins   . Sulfonamide Derivatives

## 2010-09-19 NOTE — Telephone Encounter (Signed)
Pt aware of recs per CDY and will call if she has any other problems while on this medication.

## 2010-09-28 ENCOUNTER — Ambulatory Visit (HOSPITAL_BASED_OUTPATIENT_CLINIC_OR_DEPARTMENT_OTHER): Payer: Medicare Other | Attending: Internal Medicine

## 2010-09-28 DIAGNOSIS — G4737 Central sleep apnea in conditions classified elsewhere: Secondary | ICD-10-CM | POA: Insufficient documentation

## 2010-09-28 DIAGNOSIS — G4733 Obstructive sleep apnea (adult) (pediatric): Secondary | ICD-10-CM | POA: Insufficient documentation

## 2010-10-01 DIAGNOSIS — G4737 Central sleep apnea in conditions classified elsewhere: Secondary | ICD-10-CM

## 2010-10-01 DIAGNOSIS — G4733 Obstructive sleep apnea (adult) (pediatric): Secondary | ICD-10-CM

## 2010-10-01 NOTE — Procedures (Signed)
NAME:  Brianna Mcdonald, Brianna Mcdonald NO.:  [id number]  MEDICAL RECORD NO.:  [id number]          PATIENT TYPE:  OUT  LOCATION:  SLEEP CENTER                 FACILITY:  [facility]  PHYSICIAN:  Sarahgrace Broman D. Maple Hudson, MD, FCCP, FACPDATE OF BIRTH:  03[birth year]02-1933  DATE OF STUDY:  09/28/2010                           NOCTURNAL POLYSOMNOGRAM  REFERRING PHYSICIAN:  Ellese Julius D. Cinderella Christoffersen, MD, FCCP, FACP  INDICATION FOR STUDY:  Insomnia with sleep apnea.  EPWORTH SLEEPINESS SCORE:  3/24, BMI 28.8.  Weight 150 pounds, height 60.5 inches.  Neck 13 inches.  MEDICATIONS:  Home medications are charted and reviewed.  SLEEP ARCHITECTURE:  Total sleep time 255 minutes with sleep efficiency 60.1%.  Stage I was 9%, stage II 77.8%, stage III absent, REM 13.1% of total sleep time.  Sleep latency 13.5 minutes, REM latency 256.5 minutes, awake after sleep onset 156.5 minutes, arousal index 9.9. Bedtime medication:  None.  RESPIRATORY DATA:  Apnea/hypopnea index (AHI) 12 per hour.  A total of 51 events was scored including 6 obstructive apneas, 10 central apneas, 1 mixed apnea, 34 hypopneas.  Most events associated with supine sleep and REM.  REM AHI 23.3 per hour.  RDI 13.9 per hour.  The patient declined to wear CPAP and stated that if she "needed CPAP, she would go back to [place] and have the test done.".  OXYGEN DATA:  Snoring was not indicated by the technician.  Oxygen desaturation to a nadir of 88% with a mean oxygen saturation through the study of 95.3% on room air.  Through the total recording time, 0.1 minute was recorded with saturation less than 88% on room air.  CARDIAC DATA:  Normal sinus rhythm.  MOVEMENT-PARASOMNIA:  No significant movement disturbance.  No bathroom trips.  IMPRESSIONS-RECOMMENDATIONS: 1. 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 2. The patient was awake through significant portions of the study     especially between midnight and 2:00 a.m., nonspecific but     suggesting that some of her sleep complaints reflect insomnia in     the home environment.  Light use of sleep medications usually does     not substantially worsen sleep apnea to a medically significant     degree, particularly if used intermittently on an as-needed basis. 3. Her mild sleep apnea has a significant central component which     would not be expected to respond to CPAP.     At age 77 that she is unlikely to learn to be comfortable with CPAP     even if willing to try, although this is variable.  It may be most     clinically appropriate to monitor her overtime for status changes.     [name] D. Maple Hudson, MD, [facility], FACP Diplomate, Biomedical engineer of Sleep Medicine Electronically Signed    CDY/MEDQ  D:  10/01/2010 10:19:00  T:  10/01/2010 11:11:25  Job:  [id number]

## 2010-10-17 ENCOUNTER — Telehealth: Payer: Self-pay | Admitting: Internal Medicine

## 2010-10-17 NOTE — Telephone Encounter (Signed)
Called # provided above but # is no longer in service.  Per previous phone note, pt's contact # is [phone].  Called that # but received message stating # was unable to except VM messages at this time - WCB.  In the meantime, will forward message to CDY to see if results are ready yet.  Pls advise.  Thanks!

## 2010-10-18 ENCOUNTER — Telehealth: Payer: Self-pay | Admitting: Internal Medicine

## 2010-10-18 NOTE — Telephone Encounter (Signed)
Pt called again for sleep results also says that she has a sore in her nose and wants to know what she can use for this can be reached at [phone].//[employer] [email or web address].Raylene Everts

## 2010-10-18 NOTE — Telephone Encounter (Signed)
Spoke with pt. She states that she had PSG done 09/28/10 and is anxious to get these results. Also, she has had "sore in nose" x 5 days. She states there is a painful sore in left nostril, she has tried "nose drops" for this w/o any relief. Please advise, thanks!

## 2010-10-18 NOTE — Telephone Encounter (Signed)
Called number provided and it is no longer in service. CDY, have you read results yet? Should we send letter to pt since phone d/c'ed? Please advise, thanks!

## 2010-10-18 NOTE — Telephone Encounter (Signed)
Error duplicate.Brianna Mcdonald

## 2010-10-20 ENCOUNTER — Telehealth: Payer: Self-pay | Admitting: Internal Medicine

## 2010-10-20 ENCOUNTER — Encounter: Payer: Self-pay | Admitting: Internal Medicine

## 2010-10-20 NOTE — Telephone Encounter (Signed)
Pt is calling back again asking for sleep study results.  Please advise.  Thanks.

## 2010-10-20 NOTE — Telephone Encounter (Signed)
This is a duplicate message.  Will sign off. 

## 2010-10-20 NOTE — Telephone Encounter (Signed)
She has 2 sleep problems 1) Insomnia- suggest gentle sleep medication at bedtime like Unisom or Sominex to try. 2) Sleep apnea- mild, 12 times per hour. We can watch this and discuss it at her next visit. If it is really bothering her, then we can try using a CPAP air pressure mask at night while she sleeps if she would like to try it.

## 2010-10-21 NOTE — Telephone Encounter (Signed)
Home # disconnected and the new # just rings and is unable to accept any msgs. Have made schedulers aware if pt calls try to get someone on the phone to talk to her or get a working number for pt

## 2010-10-24 ENCOUNTER — Encounter: Payer: Self-pay | Admitting: Internal Medicine

## 2010-10-25 ENCOUNTER — Encounter: Payer: Self-pay | Admitting: Internal Medicine

## 2010-10-25 ENCOUNTER — Telehealth: Payer: Self-pay | Admitting: Internal Medicine

## 2010-10-25 ENCOUNTER — Ambulatory Visit (INDEPENDENT_AMBULATORY_CARE_PROVIDER_SITE_OTHER): Payer: Medicare Other | Admitting: Internal Medicine

## 2010-10-25 VITALS — BP 122/76 | HR 55 | Ht 61.0 in | Wt 156.6 lb

## 2010-10-25 DIAGNOSIS — K219 Gastro-esophageal reflux disease without esophagitis: Secondary | ICD-10-CM

## 2010-10-25 DIAGNOSIS — G4730 Sleep apnea, unspecified: Secondary | ICD-10-CM

## 2010-10-25 DIAGNOSIS — J309 Allergic rhinitis, unspecified: Secondary | ICD-10-CM

## 2010-10-25 NOTE — Patient Instructions (Signed)
Booklet on sleep apnea  Try Neosporin ointment for your nose

## 2010-10-25 NOTE — Progress Notes (Signed)
Subjective:    Patient ID: Brianna Mcdonald, female    DOB: July 10, 1932, 77 y.o.   MRN: [id number]  HPI    Review of Systems     Objective:   Physical Exam        Assessment & Plan:   Subjective:    Patient ID: Brianna Mcdonald, female    DOB: May 01, 1933, 77 y.o.   MRN: [id number]  HPI 09/16/10- 40 yoF followed for allergic rhinitis and bronchitis, complicated by GERD.  Last here November 12, 2009. Had been going to [facility]. Had reflux there in dental chair, managed in ER. Has not been noting reflux at home at night. Had colonoscopy by Dr Bosie Clos and was told she stopped breathing twice. They asked she get evaluated for sleep apnea. She has a patient information handout on OSA. Husband never told her she snored when he was alive.  Has been more short of breath this week and had some fever and chills yesterday, sore throat, soreness across chest, chest congestion without productive cough.   10/25/10- 77 yoF followed for allergic rhinitis and bronchitis, complicated by GERD.  At last visit we gave reflux precautions for cough, and ordered sleep study because of apnea noted during concious sedation for colonoscopy. Says today feels well except for crusting sore left nostril. NPSG- 09/28/10- AHI 12/ hr. She finds anything on her head disturbing at night and is not much interested in CPAP, chin straps or oral appliances. We discussed OSA- medical issues and treatment options.  Review of Systems Constitutional:   No weight loss, night sweats, , chills, fatigue, lassitude. HEENT:   No headaches,  Difficulty swallowing, ,  Sore throat,                No sneezing, itching, ear ache, nasal congestion, post nasal drip,   CV:  No chest pain,  Orthopnea, PND, swelling in lower extremities, anasarca, dizziness, palpitations  GI  No abdominal pain, nausea, vomiting, diarrhea, change in bowel habits, loss of appetite  Resp:  No excess mucus, no productive cough,  No non-productive  cough,  No coughing up of blood.  No change in color of mucus.  No wheezing.    Skin: no rash or lesions.  GU: no dysuria, change in color of urine, no urgency or frequency.  No flank pain.  MS:  No joint pain or swelling.  No decreased range of motion.  No back pain.  Psych:  No change in mood or affect. No depression or anxiety.  No memory loss.      Objective:   Physical Exam General- Alert, Oriented, Affect-appropriate, Distress- none acute   Calm  Skin- rash-none, lesions- none, excoriation- none  Lymphadenopathy- none  Head- atraumatic  Eyes- Gross vision intact, PERRLA, conjunctivae clear secretions  Ears- Hearing, canals, Tm - normal  Nose- Moderate crusting lesion medial left nare. ,  No- Septal dev, mucus, polyps, erosion, perforation   Throat- Mallampati II , mucosa clear , drainage- none, tonsils- atrophic.  Upper plate, missing lower teeth  Neck- flexible , trachea midline, no stridor , thyroid nl, carotid no bruit  Chest - symmetrical excursion , unlabored     Heart/CV- RRR , no murmur , no gallop  , no rub, nl s1 s2                     - JVD- none , edema- none, stasis changes- none, varices- none     Lung- clear to  P&A, wheeze- none, cough- none , dullness-none, rub- none     Chest wall-   Abd- tender-no, distended-no, bowel sounds-present, HSM- no  Br/ Gen/ Rectal- Not done, not indicated  Extrem- cyanosis- none, clubbing, none, atrophy- none, strength- nl  Neuro- grossly intact to observation         Assessment & Plan:

## 2010-10-25 NOTE — Assessment & Plan Note (Addendum)
Mild OSA. We began discussion of her sleep disturbed breathing. We will give her a booklet. She doesn't admit daytime tiredness.

## 2010-10-25 NOTE — Telephone Encounter (Signed)
Pt was seen today and discussed this with CDY.

## 2010-10-25 NOTE — Telephone Encounter (Signed)
Spoke with pt and she is requesting list of her allergies be mailed to her. List printed and mailed to her. She is aware.

## 2010-10-30 NOTE — Assessment & Plan Note (Signed)
We reviewed reflux precautions again. She follows with Dr Bosie Clos.

## 2010-10-30 NOTE — Assessment & Plan Note (Signed)
She is satisfied with current control now in summer weather.

## 2010-11-15 ENCOUNTER — Ambulatory Visit: Payer: Medicare Other | Attending: Rheumatology | Admitting: Rehabilitation

## 2010-11-15 DIAGNOSIS — M545 Low back pain, unspecified: Secondary | ICD-10-CM | POA: Insufficient documentation

## 2010-11-15 DIAGNOSIS — IMO0001 Reserved for inherently not codable concepts without codable children: Secondary | ICD-10-CM | POA: Insufficient documentation

## 2010-11-15 DIAGNOSIS — M25569 Pain in unspecified knee: Secondary | ICD-10-CM | POA: Insufficient documentation

## 2010-11-15 DIAGNOSIS — M25559 Pain in unspecified hip: Secondary | ICD-10-CM | POA: Insufficient documentation

## 2010-11-15 DIAGNOSIS — M25579 Pain in unspecified ankle and joints of unspecified foot: Secondary | ICD-10-CM | POA: Insufficient documentation

## 2010-11-22 ENCOUNTER — Ambulatory Visit: Payer: Medicare Other | Admitting: Physical Therapy

## 2010-11-24 ENCOUNTER — Ambulatory Visit: Payer: Medicare Other | Admitting: Physical Therapy

## 2010-11-29 ENCOUNTER — Ambulatory Visit: Payer: Medicare Other | Admitting: Physical Therapy

## 2010-12-01 ENCOUNTER — Ambulatory Visit: Payer: Medicare Other | Attending: Rheumatology | Admitting: Physical Therapy

## 2010-12-01 DIAGNOSIS — M545 Low back pain, unspecified: Secondary | ICD-10-CM | POA: Insufficient documentation

## 2010-12-01 DIAGNOSIS — M25579 Pain in unspecified ankle and joints of unspecified foot: Secondary | ICD-10-CM | POA: Insufficient documentation

## 2010-12-01 DIAGNOSIS — IMO0001 Reserved for inherently not codable concepts without codable children: Secondary | ICD-10-CM | POA: Insufficient documentation

## 2010-12-01 DIAGNOSIS — M25559 Pain in unspecified hip: Secondary | ICD-10-CM | POA: Insufficient documentation

## 2010-12-01 DIAGNOSIS — M25569 Pain in unspecified knee: Secondary | ICD-10-CM | POA: Insufficient documentation

## 2010-12-06 ENCOUNTER — Ambulatory Visit: Payer: Medicare Other | Admitting: Physical Therapy

## 2010-12-08 ENCOUNTER — Ambulatory Visit: Payer: Medicare Other | Admitting: Physical Therapy

## 2010-12-13 ENCOUNTER — Encounter: Payer: Medicare Other | Admitting: Physical Therapy

## 2010-12-15 ENCOUNTER — Encounter: Payer: Medicare Other | Admitting: Physical Therapy

## 2010-12-19 ENCOUNTER — Telehealth: Payer: Self-pay | Admitting: Internal Medicine

## 2010-12-19 DIAGNOSIS — G4730 Sleep apnea, unspecified: Secondary | ICD-10-CM

## 2010-12-19 NOTE — Telephone Encounter (Signed)
Fine to refer her to [facility] for Sleep apnea.

## 2010-12-19 NOTE — Telephone Encounter (Signed)
CY-please advise if you are okay with [place] sending referral for patient. Thanks.

## 2010-12-20 ENCOUNTER — Telehealth: Payer: Self-pay | Admitting: Internal Medicine

## 2010-12-20 NOTE — Telephone Encounter (Signed)
Order placed to [facility] for [facility] referral. Left message with daughter to advise pt of same.

## 2010-12-21 NOTE — Telephone Encounter (Signed)
Spoke to pt she is aware if her appt at [facility] 02/07/11@1 :00pm

## 2010-12-26 ENCOUNTER — Encounter: Payer: Self-pay | Admitting: Internal Medicine

## 2010-12-26 ENCOUNTER — Other Ambulatory Visit (INDEPENDENT_AMBULATORY_CARE_PROVIDER_SITE_OTHER): Payer: Medicare Other

## 2010-12-26 ENCOUNTER — Ambulatory Visit (INDEPENDENT_AMBULATORY_CARE_PROVIDER_SITE_OTHER): Payer: Medicare Other | Admitting: Internal Medicine

## 2010-12-26 ENCOUNTER — Other Ambulatory Visit: Payer: Self-pay | Admitting: Internal Medicine

## 2010-12-26 DIAGNOSIS — I10 Essential (primary) hypertension: Secondary | ICD-10-CM

## 2010-12-26 DIAGNOSIS — K219 Gastro-esophageal reflux disease without esophagitis: Secondary | ICD-10-CM

## 2010-12-26 DIAGNOSIS — E785 Hyperlipidemia, unspecified: Secondary | ICD-10-CM | POA: Insufficient documentation

## 2010-12-26 DIAGNOSIS — E782 Mixed hyperlipidemia: Secondary | ICD-10-CM

## 2010-12-26 LAB — COMPREHENSIVE METABOLIC PANEL
AST: 20 U/L (ref 0–37)
Albumin: 3.4 g/dL — ABNORMAL LOW (ref 3.5–5.2)
Alkaline Phosphatase: 87 U/L (ref 39–117)
BUN: 10 mg/dL (ref 6–23)
Potassium: 4.6 mEq/L (ref 3.5–5.1)

## 2010-12-26 LAB — CBC WITH DIFFERENTIAL/PLATELET
Basophils Relative: 0.4 % (ref 0.0–3.0)
Eosinophils Absolute: 0.1 10*3/uL (ref 0.0–0.7)
MCHC: 32.3 g/dL (ref 30.0–36.0)
MCV: 97.6 fl (ref 78.0–100.0)
Monocytes Absolute: 0.4 10*3/uL (ref 0.1–1.0)
Neutrophils Relative %: 51.6 % (ref 43.0–77.0)
Platelets: 175 10*3/uL (ref 150.0–400.0)
RBC: 4 Mil/uL (ref 3.87–5.11)

## 2010-12-26 LAB — LIPID PANEL
Cholesterol: 202 mg/dL — ABNORMAL HIGH (ref 0–200)
HDL: 73.4 mg/dL (ref >39.00)
Triglycerides: 65 mg/dL (ref 0.0–149.0)

## 2010-12-26 LAB — LDL CHOLESTEROL, DIRECT: Direct LDL: 118.6 mg/dL

## 2010-12-26 NOTE — Assessment & Plan Note (Signed)
Continue PPI therapy. 

## 2010-12-26 NOTE — Progress Notes (Signed)
Subjective:    Patient ID: Brianna Mcdonald, female    DOB: 1933/04/07, 77 y.o.   MRN: [id number]  Hypertension This is a chronic problem. The current episode started more than 1 year ago. The problem has been gradually improving since onset. The problem is controlled. Pertinent negatives include no anxiety, blurred vision, chest pain, headaches, malaise/fatigue, neck pain, orthopnea, palpitations, peripheral edema, PND, shortness of breath or sweats. There are no associated agents to hypertension. Past treatments include beta blockers. The current treatment provides moderate improvement. Compliance problems include exercise and diet.  There is no history of chronic renal disease.  Hyperlipidemia This is a chronic problem. The current episode started more than 1 year ago. The problem is uncontrolled. Recent lipid tests were reviewed and are variable. Exacerbating diseases include obesity. She has no history of chronic renal disease, diabetes, hypothyroidism, liver disease or nephrotic syndrome. Factors aggravating her hyperlipidemia include beta blockers. Pertinent negatives include no chest pain, focal sensory loss, leg pain, myalgias or shortness of breath. Current antihyperlipidemic treatment includes nicotinic acid and herbal therapy. The current treatment provides mild improvement of lipids. Compliance problems include adherence to exercise and adherence to diet.   Gastrophageal Reflux She complains of heartburn. She reports no abdominal pain, no belching, no chest pain, no choking, no coughing, no dysphagia, no early satiety, no globus sensation, no hoarse voice, no nausea, no sore throat, no stridor, no tooth decay, no water brash or no wheezing. This is a new problem. The current episode started more than 1 year ago. The problem occurs rarely. The problem has been gradually improving. The heartburn is located in the substernum. The heartburn does not wake her from sleep. The heartburn does not  limit her activity. The heartburn doesn't change with position. The symptoms are aggravated by nothing. Pertinent negatives include no anemia, fatigue, melena, muscle weakness, orthopnea or weight loss. She has tried a PPI for the symptoms. The treatment provided significant relief.      Review of Systems  Constitutional: Negative for fever, chills, weight loss, malaise/fatigue, diaphoresis, activity change, appetite change, fatigue and unexpected weight change.  HENT: Negative for sore throat, hoarse voice, trouble swallowing, neck pain and voice change.   Eyes: Negative.  Negative for blurred vision.  Respiratory: Negative for apnea, cough, choking, chest tightness, shortness of breath, wheezing and stridor.   Cardiovascular: Negative for chest pain, palpitations, orthopnea, leg swelling and PND.  Gastrointestinal: Positive for heartburn. Negative for dysphagia, nausea, vomiting, abdominal pain, diarrhea, constipation, blood in stool, melena, abdominal distention and rectal pain.  Genitourinary: Negative for dysuria, urgency, frequency, hematuria, decreased urine volume, enuresis, difficulty urinating and dyspareunia.  Musculoskeletal: Negative for myalgias, back pain, joint swelling, arthralgias, gait problem and muscle weakness.  Skin: Negative for color change, pallor, rash and wound.  Neurological: Negative for dizziness, tremors, seizures, syncope, facial asymmetry, speech difficulty, weakness, light-headedness, numbness and headaches.  Hematological: Negative for adenopathy. Does not bruise/bleed easily.  Psychiatric/Behavioral: Negative.        Objective:   Physical Exam  Vitals reviewed. Constitutional: She is oriented to person, place, and time. She appears well-developed and well-nourished. No distress.  HENT:  Head: Normocephalic and atraumatic.  Mouth/Throat: No oropharyngeal exudate.  Eyes: Conjunctivae are normal. Right eye exhibits no discharge. Left eye exhibits no  discharge. No scleral icterus.  Neck: Normal range of motion. Neck supple. No JVD present. No tracheal deviation present. No thyromegaly present.  Cardiovascular: Normal rate, regular rhythm, normal heart sounds and intact distal  pulses.  Exam reveals no gallop and no friction rub.   No murmur heard. Pulmonary/Chest: Effort normal and breath sounds normal. No stridor. No respiratory distress. She has no wheezes. She has no rales. She exhibits no tenderness.  Abdominal: Soft. Bowel sounds are normal. She exhibits no distension and no mass. There is no tenderness. There is no rebound and no guarding.  Musculoskeletal: Normal range of motion. She exhibits no edema and no tenderness.  Lymphadenopathy:    She has no cervical adenopathy.  Neurological: She is alert and oriented to person, place, and time. She has normal reflexes. She displays normal reflexes. No cranial nerve deficit. She exhibits normal muscle tone. Coordination normal.  Skin: Skin is warm and dry. No rash noted. She is not diaphoretic. No erythema. No pallor.  Psychiatric: She has a normal mood and affect. Her behavior is normal. Judgment and thought content normal.          Lab Results  Component Value Date   WBC 3.5* 06/24/2010   HGB 12.6 06/24/2010   HCT 36.9 06/24/2010   PLT 143.0* 06/24/2010   CHOL 176 06/24/2010   TRIG 55.0 06/24/2010   HDL 59.00 06/24/2010   LDLDIRECT 121.4 04/22/2007   ALT 28 06/24/2010   AST 35 06/24/2010   NA 141 06/24/2010   K 4.0 06/24/2010   CL 105 06/24/2010   CREATININE 0.9 06/24/2010   BUN 10 06/24/2010   CO2 28 06/24/2010   TSH 1.13 06/24/2010   Assessment & Plan:

## 2010-12-26 NOTE — Patient Instructions (Signed)
Hypertension (High Blood Pressure) As your heart beats, it forces blood through your arteries. This force is your blood pressure. If the pressure is too high, it is called hypertension (HTN) or high blood pressure. HTN is dangerous because you may have it and not know it. High blood pressure may mean that your heart has to work harder to pump blood. Your arteries may be narrow or stiff. The extra work puts you at risk for heart disease, stroke, and other problems.  Blood pressure consists of two numbers, a higher number over a lower, 110/72, for example. It is stated as "110 over 72." The ideal is below 120 for the top number (systolic) and under 80 for the bottom (diastolic). Write down your blood pressure today. You should pay close attention to your blood pressure if you have certain conditions such as:  Heart failure.  Prior heart attack.   Diabetes   Chronic kidney disease.   Prior stroke.   Multiple risk factors for heart disease.   To see if you have HTN, your blood pressure should be measured while you are seated with your arm held at the level of the heart. It should be measured at least twice. A one-time elevated blood pressure reading (especially in the Emergency Department) does not mean that you need treatment. There may be conditions in which the blood pressure is different between your right and left arms. It is important to see your caregiver soon for a recheck. Most people have essential hypertension which means that there is not a specific cause. This type of high blood pressure may be lowered by changing lifestyle factors such as:  Stress.  Smoking.   Lack of exercise.   Excessive weight.  Drug/tobacco/alcohol use.   Eating less salt.   Most people do not have symptoms from high blood pressure until it has caused damage to the body. Effective treatment can often prevent, delay or reduce that damage. TREATMENT Treatment for high blood pressure, when a cause has been  identified, is directed at the cause. There are a large number of medications to treat HTN. These fall into several categories, and your caregiver will help you select the medicines that are best for you. Medications may have side effects. You should review side effects with your caregiver. If your blood pressure stays high after you have made lifestyle changes or started on medicines,   Your medication(s) may need to be changed.   Other problems may need to be addressed.   Be certain you understand your prescriptions, and know how and when to take your medicine.   Be sure to follow up with your caregiver within the time frame advised (usually within two weeks) to have your blood pressure rechecked and to review your medications.   If you are taking more than one medicine to lower your blood pressure, make sure you know how and at what times they should be taken. Taking two medicines at the same time can result in blood pressure that is too low.  SEEK IMMEDIATE MEDICAL CARE IF YOU DEVELOP:  A severe headache, blurred or changing vision, or confusion.   Unusual weakness or numbness, or a faint feeling.   Severe chest or abdominal pain, vomiting, or breathing problems.  MAKE SURE YOU:   Understand these instructions.   Will watch your condition.   Will get help right away if you are not doing well or get worse.  Document Released: 04/17/2005 Document Re-Released: 10/05/2009 [employer] Patient Information 2011 [employer].Hypertriglyceridemia  Diet for High blood levels of Triglycerides Most fats in food are triglycerides. Triglycerides in your blood are stored as fat in your body. High levels of triglycerides in your blood may put you at a greater risk for heart disease and stroke.  Normal triglyceride levels are less than 150 mg/dL. Borderline high levels are 150-199 mg/dl. High levels are 200 - 499 mg/dL, and very high triglyceride levels are greater than 500 mg/dL. The decision to  treat high triglycerides is generally based on the level. For people with borderline or high triglyceride levels, treatment includes weight loss and exercise. Drugs are recommended for people with very high triglyceride levels. Many people who need treatment for high triglyceride levels have metabolic syndrome. This syndrome is a collection of disorders that often include: insulin resistance, high blood pressure, blood clotting problems, high cholesterol and triglycerides. TESTING PROCEDURE FOR TRIGLYCERIDES  You should not eat 4 hours before getting your triglycerides measured. The normal range of triglycerides is between 10 and 250 milligrams per deciliter (mg/dl). Some people may have extreme levels (1000 or above), but your triglyceride level may be too high if it is above 150 mg/dl, depending on what other risk factors you have for heart disease.   People with high blood triglycerides may also have high blood cholesterol levels. If you have high blood cholesterol as well as high blood triglycerides, your risk for heart disease is probably greater than if you only had high triglycerides. High blood cholesterol is one of the main risk factors for heart disease.  CHANGING YOUR DIET  Your weight can affect your blood triglyceride level. If you are more than 20% above your ideal body weight, you may be able to lower your blood triglycerides by losing weight. Eating less and exercising regularly is the best way to combat this. Fat provides more calories than any other food. The best way to lose weight is to eat less fat. Only 30% of your total calories should come from fat. Less than 7% of your diet should come from saturated fat. A diet low in fat and saturated fat is the same as a diet to decrease blood cholesterol. By eating a diet lower in fat, you may lose weight, lower your blood cholesterol, and lower your blood triglyceride level.  Eating a diet low in fat, especially saturated fat, may also help you  lower your blood triglyceride level. Ask your dietitian to help you figure how much fat you can eat based on the number of calories your caregiver has prescribed for you.  Exercise, in addition to helping with weight loss may also help lower triglyceride levels.   Alcohol can increase blood triglycerides. You may need to stop drinking alcoholic beverages.   Too much carbohydrate in your diet may also increase your blood triglycerides. Some complex carbohydrates are necessary in your diet. These may include bread, rice, potatoes, other starchy vegetables and cereals.   Reduce "simple" carbohydrates. These may include pure sugars, candy, honey, and jelly without losing other nutrients. If you have the kind of high blood triglycerides that is affected by the amount of carbohydrates in your diet, you will need to eat less sugar and less high-sugar foods. Your caregiver can help you with this.   Adding 2-4 grams of fish oil (EPA+ DHA) may also help lower triglycerides. Speak with your caregiver before adding any supplements to your regimen.  Following the Diet  Maintain your ideal weight. Your caregivers can help you with a diet.   Generally, eating less food and getting more exercise will help you lose weight. Joining a weight control group may also help. Ask your caregivers for a good weight control group in your area.  Eat low-fat foods instead of high-fat foods. This can help you lose weight too.  These foods are lower in fat. Eat MORE of these:   Dried beans, peas, and lentils.   Egg whites.   Low-fat cottage cheese.   Fish.   Lean cuts of meat, such as round, sirloin, rump, and flank (cut extra fat off meat you fix).   Whole grain breads, cereals and pasta.   Skim and nonfat dry milk.   Low-fat yogurt.   Poultry without the skin.   Cheese made with skim or part-skim milk, such as mozzarella, parmesan, farmers', ricotta, or pot cheese.   These are higher fat foods. Eat LESS of these:     Whole milk and foods made from whole milk, such as American, blue, cheddar, monterey jack, and swiss cheese   High-fat meats, such as luncheon meats, sausages, knockwurst, bratwurst, hot dogs, ribs, corned beef, ground pork, and regular ground beef.   Fried foods.  Limit saturated fats in your diet. Substituting unsaturated fat for saturated fat may decrease your blood triglyceride level. You will need to read package labels to know which products contain saturated fats.  These foods are high in saturated fat. Eat LESS of these:   Fried pork skins.  Whole milk.   Skin and fat from poultry.   Palm oil.   Butter.   Shortening.   Cream cheese.   Bacon.   Margarines and baked goods made from listed oils.   Vegetable shortenings.   Chitterlings.  Fat from meats.   Coconut oil.   Palm kernel oil.   Lard.   Cream.   Sour cream.   Fatback.   Coffee whiteners and non-dairy creamers made with these oils.   Cheese made from whole milk.   Use unsaturated fats (both polyunsaturated and monounsaturated) moderately. Remember, even though unsaturated fats are better than saturated fats; you still want a diet low in total fat.  These foods are high in unsaturated fat:   Canola oil.  Sunflower oil.   Mayonnaise.   Almonds.   Peanuts.   Pine nuts.   Margarines made with these oils.   Safflower oil.  Olive oil.   Avocados.   Cashews.   Peanut butter.   Sunflower seeds.   Soybean oil.  Peanut oil.   Olives.   Pecans.   Walnuts.   Pumpkin seeds.   Avoid sugar and other high-sugar foods. This will decrease carbohydrates without decreasing other nutrients. Sugar in your food goes rapidly to your blood. When there is excess sugar in your blood, your liver may use it to make more triglycerides. Sugar also contains calories without other important nutrients.  Eat LESS of these:   Sugar, brown sugar, powdered sugar, jam, jelly, preserves, honey, syrup,  molasses, pies, candy, cakes, cookies, frosting, pastries, colas, soft drinks, punches, fruit drinks, and regular gelatin.   Avoid alcohol. Alcohol, even more than sugar, may increase blood triglycerides. In addition, alcohol is high in calories and low in nutrients. Ask for sparkling water, or a diet soft drink instead of an alcoholic beverage.  Suggestions for planning and preparing meals   Bake, broil, grill or roast meats instead of frying.   Remove fat from meats and skin from poultry before cooking.   Add spices, herbs,   lemon juice or vinegar to vegetables instead of salt, rich sauces or gravies.   Use a non-stick skillet without fat or use no-stick sprays.   Cool and refrigerate stews and broth. Then remove the hardened fat floating on the surface before serving.   Refrigerate meat drippings and skim off fat to make low-fat gravies.   Serve more fish.   Use less butter, margarine and other high-fat spreads on bread or vegetables.   Use skim or reconstituted non-fat dry milk for cooking.   Cook with low-fat cheeses.   Substitute low-fat yogurt or cottage cheese for all or part of the sour cream in recipes for sauces, dips or congealed salads.   Use half yogurt/half mayonnaise in salad recipes.   Substitute evaporated skim milk for cream. Evaporated skim milk or reconstituted non-fat dry milk can be whipped and substituted for whipped cream in certain recipes.   Choose fresh fruits for dessert instead of high-fat foods such as pies or cakes. Fruits are naturally low in fat.  When Dining Out   Order low-fat appetizers such as fruit or vegetable juice, pasta with vegetables or tomato sauce.   Select clear, rather than cream soups.   Ask that dressings and gravies be served on the side. Then use less of them.   Order foods that are baked, broiled, poached, steamed, stir-fried, or roasted.   Ask for margarine instead of butter, and use only a small amount.   Drink  sparkling water, unsweetened tea or coffee, or diet soft drinks instead of alcohol or other sweet beverages.  QUESTIONS AND ANSWERS ABOUT OTHER FATS IN THE BLOOD:  SATURATED FAT, TRANS FAT, AND CHOLESTEROL What is trans fat? Trans fat is a type of fat that is formed when vegetable oil is hardened through a process called hydrogenation. This process helps makes foods more solid, gives them shape, and prolongs their shelf life. Trans fats are also called hydrogenated or partially hydrogenated oils.  What do saturated fat, trans fat, and cholesterol in foods have to do with heart disease? Saturated fat, trans fat, and cholesterol in the diet all raise the level of LDL "bad" cholesterol in the blood. The higher the LDL cholesterol, the greater the risk for coronary heart disease (CHD). Saturated fat and trans fat raise LDL similarly.  What foods contain saturated fat, trans fat, and cholesterol? High amounts of saturated fat are found in animal products, such as fatty cuts of meat, chicken skin, and full-fat dairy products like butter, whole milk, cream, and cheese, and in tropical vegetable oils such as palm, palm kernel, and coconut oil. Trans fat is found in some of the same foods as saturated fat, such as vegetable shortening, some margarines (especially hard or stick margarine), crackers, cookies, baked goods, fried foods, salad dressings, and other processed foods made with partially hydrogenated vegetable oils. Small amounts of trans fat also occur naturally in some animal products, such as milk products, beef, and lamb. Foods high in cholesterol include liver, other organ meats, egg yolks, shrimp, and full-fat dairy products. How can I use the new food label to make heart-healthy food choices? Check the Nutrition Facts panel of the food label. Choose foods lower in saturated fat, trans fat, and cholesterol. For saturated fat and cholesterol, you can also use the Percent Daily Value (%DV): 5% DV or less  is low, and 20% DV or more is high. (There is no %DV for trans fat.) Use the Nutrition Facts panel to choose foods low in   saturated fat and cholesterol, and if the trans fat is not listed, read the ingredients and limit products that list shortening or hydrogenated or partially hydrogenated vegetable oil, which tend to be high in trans fat. POINTS TO REMEMBER: YOU NEED A LITTLE TLC (THERAPEUTIC LIFESTYLE CHANGES)  Discuss your risk for heart disease with your caregivers, and take steps to reduce risk factors.   Change your diet. Choose foods that are low in saturated fat, trans fat, and cholesterol.   Add exercise to your daily routine if it is not already being done. Participate in physical activity of moderate intensity, like brisk walking, for at least 30 minutes on most, and preferably all days of the week. No time? Break the 30 minutes into three, 10-minute segments during the day.   Stop smoking. If you do smoke, contact your caregiver to discuss ways in which they can help you quit.   Do not use street drugs.   Maintain a normal weight.   Maintain a healthy blood pressure.   Keep up with your blood work for checking the fats in your blood as directed by your caregiver.  Document Released: 02/03/2004 Document Re-Released: 10/05/2009 [employer] Patient Information 2011 [employer]. 

## 2010-12-26 NOTE — Assessment & Plan Note (Signed)
Will check FLP, TSH, and CMP today 

## 2010-12-26 NOTE — Assessment & Plan Note (Signed)
Her BP is well controlled 

## 2011-01-10 ENCOUNTER — Other Ambulatory Visit: Payer: Self-pay | Admitting: Cardiology

## 2011-01-10 ENCOUNTER — Telehealth: Payer: Self-pay | Admitting: Internal Medicine

## 2011-01-10 MED ORDER — DESLORATADINE 5 MG PO TABS: 5.0000 mg | ORAL_TABLET | Freq: Every day | ORAL | Status: DC

## 2011-01-10 NOTE — Telephone Encounter (Signed)
Spoke with pt and notified rx for clarinex was sent to pharm. Pt verbalized understanding and states nothing further needed.

## 2011-01-19 LAB — DIFFERENTIAL
Eosinophils Absolute: 0.1
Lymphocytes Relative: 47 — ABNORMAL HIGH
Lymphs Abs: 2.4
Monocytes Relative: 12
Neutro Abs: 1.9
Neutrophils Relative %: 39 — ABNORMAL LOW

## 2011-01-19 LAB — POCT I-STAT CREATININE
Creatinine, Ser: 0.8
Operator id: 196461

## 2011-01-19 LAB — I-STAT 8, (EC8 V) (CONVERTED LAB)
Acid-Base Excess: 2
HCT: 39
Hemoglobin: 13.3
Operator id: 196461
Potassium: 4.4
Sodium: 137
TCO2: 32
pH, Ven: 7.294

## 2011-01-19 LAB — CBC
Hemoglobin: 12.5
MCHC: 34
RBC: 3.75 — ABNORMAL LOW
WBC: 5

## 2011-01-19 LAB — POCT CARDIAC MARKERS: CKMB, poc: <1 — ABNORMAL LOW

## 2011-02-06 ENCOUNTER — Other Ambulatory Visit: Payer: Self-pay | Admitting: Internal Medicine

## 2011-02-06 LAB — URINE MICROSCOPIC-ADD ON

## 2011-02-06 LAB — URINE CULTURE

## 2011-02-06 LAB — URINALYSIS, ROUTINE W REFLEX MICROSCOPIC
Nitrite: NEGATIVE
Specific Gravity, Urine: 1.016
Urobilinogen, UA: 0.2
pH: 7

## 2011-02-17 ENCOUNTER — Ambulatory Visit: Payer: Medicare Other | Admitting: Internal Medicine

## 2011-02-21 ENCOUNTER — Ambulatory Visit: Payer: Medicare Other | Admitting: Internal Medicine

## 2011-04-07 ENCOUNTER — Encounter: Payer: Self-pay | Admitting: Internal Medicine

## 2011-04-07 ENCOUNTER — Other Ambulatory Visit (INDEPENDENT_AMBULATORY_CARE_PROVIDER_SITE_OTHER): Payer: Medicare Other

## 2011-04-07 ENCOUNTER — Other Ambulatory Visit: Payer: Self-pay | Admitting: Internal Medicine

## 2011-04-07 ENCOUNTER — Ambulatory Visit (INDEPENDENT_AMBULATORY_CARE_PROVIDER_SITE_OTHER): Payer: Medicare Other | Admitting: Internal Medicine

## 2011-04-07 VITALS — BP 120/70 | HR 54 | Temp 98.1°F | Resp 16 | Wt 155.0 lb

## 2011-04-07 DIAGNOSIS — R739 Hyperglycemia, unspecified: Secondary | ICD-10-CM

## 2011-04-07 DIAGNOSIS — R0602 Shortness of breath: Secondary | ICD-10-CM

## 2011-04-07 DIAGNOSIS — R7309 Other abnormal glucose: Secondary | ICD-10-CM

## 2011-04-07 DIAGNOSIS — E782 Mixed hyperlipidemia: Secondary | ICD-10-CM

## 2011-04-07 DIAGNOSIS — Z136 Encounter for screening for cardiovascular disorders: Secondary | ICD-10-CM

## 2011-04-07 DIAGNOSIS — J309 Allergic rhinitis, unspecified: Secondary | ICD-10-CM

## 2011-04-07 DIAGNOSIS — I10 Essential (primary) hypertension: Secondary | ICD-10-CM

## 2011-04-07 DIAGNOSIS — IMO0002 Reserved for concepts with insufficient information to code with codable children: Secondary | ICD-10-CM | POA: Insufficient documentation

## 2011-04-07 DIAGNOSIS — E118 Type 2 diabetes mellitus with unspecified complications: Secondary | ICD-10-CM | POA: Insufficient documentation

## 2011-04-07 LAB — LIPID PANEL
HDL: 74.5 mg/dL (ref >39.00)
Triglycerides: 75 mg/dL (ref 0.0–149.0)

## 2011-04-07 LAB — HEMOGLOBIN A1C: Hgb A1c MFr Bld: 7 % — ABNORMAL HIGH (ref 4.6–6.5)

## 2011-04-07 LAB — COMPREHENSIVE METABOLIC PANEL
Alkaline Phosphatase: 102 U/L (ref 39–117)
BUN: 10 mg/dL (ref 6–23)
Creatinine, Ser: 0.9 mg/dL (ref 0.4–1.2)
Glucose, Bld: 126 mg/dL — ABNORMAL HIGH (ref 70–99)
Total Bilirubin: 0.7 mg/dL (ref 0.3–1.2)

## 2011-04-07 LAB — LDL CHOLESTEROL, DIRECT: Direct LDL: 120.6 mg/dL

## 2011-04-07 MED ORDER — FLUTICASONE PROPIONATE 50 MCG/ACT NA SUSP: 2.0000 | Freq: Every day | NASAL | Status: DC

## 2011-04-07 NOTE — Progress Notes (Signed)
Subjective:    Patient ID: Brianna Mcdonald, female    DOB: 1932-10-11, 77 y.o.   MRN: [id number]  Hypertension This is a chronic problem. The current episode started more than 1 year ago. The problem has been gradually improving since onset. The problem is controlled. Associated symptoms include malaise/fatigue. Pertinent negatives include no anxiety, blurred vision, chest pain, headaches, neck pain, orthopnea, palpitations, peripheral edema, PND, shortness of breath or sweats. There are no associated agents to hypertension. Past treatments include beta blockers. The current treatment provides significant improvement. Compliance problems include exercise and diet.  There is no history of chronic renal disease.  Hyperlipidemia This is a chronic problem. The current episode started more than 1 year ago. The problem is controlled. Recent lipid tests were reviewed and are variable. Exacerbating diseases include obesity. She has no history of chronic renal disease, diabetes, hypothyroidism, liver disease or nephrotic syndrome. Factors aggravating her hyperlipidemia include no known factors. Pertinent negatives include no chest pain, focal sensory loss, focal weakness, leg pain, myalgias or shortness of breath. Current antihyperlipidemic treatment includes nicotinic acid. The current treatment provides moderate improvement of lipids. Compliance problems include adherence to exercise and adherence to diet.       Review of Systems  Constitutional: Positive for malaise/fatigue and fatigue. Negative for fever, chills, diaphoresis, activity change, appetite change and unexpected weight change.  HENT: Negative for sore throat, trouble swallowing, neck pain and voice change.   Eyes: Negative.  Negative for blurred vision.  Respiratory: Negative for apnea, cough, choking, chest tightness, shortness of breath, wheezing and stridor.   Cardiovascular: Negative for chest pain, palpitations, orthopnea and PND.    Gastrointestinal: Negative for nausea, vomiting, abdominal pain, diarrhea and constipation.  Musculoskeletal: Negative for myalgias, back pain, joint swelling, arthralgias and gait problem.  Skin: Negative for color change, pallor, rash and wound.  Neurological: Negative for dizziness, tremors, focal weakness, seizures, syncope, facial asymmetry, speech difficulty, weakness, light-headedness, numbness and headaches.  Hematological: Negative for adenopathy. Does not bruise/bleed easily.  Psychiatric/Behavioral: Negative.        Objective:   Physical Exam  Vitals reviewed. Constitutional: She is oriented to person, place, and time. She appears well-developed and well-nourished. No distress.  HENT:  Head: Normocephalic and atraumatic.  Mouth/Throat: Oropharynx is clear and moist. No oropharyngeal exudate.  Eyes: Conjunctivae are normal. Right eye exhibits no discharge. Left eye exhibits no discharge. No scleral icterus.  Neck: Normal range of motion. Neck supple. No JVD present. No tracheal deviation present. No thyromegaly present.  Cardiovascular: Normal rate, regular rhythm, normal heart sounds and intact distal pulses.  Exam reveals no gallop and no friction rub.   No murmur heard. Pulmonary/Chest: Effort normal and breath sounds normal. No stridor. No respiratory distress. She has no wheezes. She has no rales. She exhibits no tenderness.  Abdominal: Soft. Bowel sounds are normal. She exhibits no distension and no mass. There is no tenderness. There is no rebound and no guarding.  Musculoskeletal: Normal range of motion. She exhibits no edema and no tenderness.  Lymphadenopathy:    She has no cervical adenopathy.  Neurological: She is oriented to person, place, and time.  Skin: Skin is warm and dry. No rash noted. She is not diaphoretic. No erythema. No pallor.  Psychiatric: She has a normal mood and affect. Her behavior is normal. Judgment and thought content normal.      Lab  Results  Component Value Date   WBC 4.4* 12/26/2010   HGB 12.6 12/26/2010  HCT 39.1 12/26/2010   PLT 175.0 12/26/2010   GLUCOSE 183* 12/26/2010   CHOL 202* 12/26/2010   TRIG 65.0 12/26/2010   HDL 73.40 12/26/2010   LDLDIRECT 118.6 12/26/2010   LDLCALC 106* 06/24/2010   ALT 18 12/26/2010   AST 20 12/26/2010   NA 139 12/26/2010   K 4.6 12/26/2010   CL 102 12/26/2010   CREATININE 0.9 12/26/2010   BUN 10 12/26/2010   CO2 29 12/26/2010   TSH 1.38 12/26/2010      Assessment & Plan:

## 2011-04-07 NOTE — Assessment & Plan Note (Signed)
I will check an a1c to see if she has DM

## 2011-04-07 NOTE — Assessment & Plan Note (Signed)
She has fatigue but no CP or SOB and her EKG shows sinus brady c/w beta-blocker therapy so I discussed this with her and she does not want to make any changes in her meds

## 2011-04-07 NOTE — Assessment & Plan Note (Signed)
She is tolerating her current regimen well, I will check her FLP today

## 2011-04-07 NOTE — Assessment & Plan Note (Signed)
Her BP is well controlled, I will check her lytes and renal function 

## 2011-04-07 NOTE — Patient Instructions (Signed)

## 2011-04-07 NOTE — Assessment & Plan Note (Signed)
Continue flonase ns 

## 2011-04-10 ENCOUNTER — Ambulatory Visit: Payer: Medicare Other | Admitting: Internal Medicine

## 2011-04-19 ENCOUNTER — Telehealth: Payer: Self-pay

## 2011-04-19 DIAGNOSIS — J309 Allergic rhinitis, unspecified: Secondary | ICD-10-CM

## 2011-04-19 MED ORDER — FLUTICASONE PROPIONATE 50 MCG/ACT NA SUSP: 2.0000 | Freq: Every day | NASAL | Status: DC

## 2011-04-19 MED ORDER — LANSOPRAZOLE 30 MG PO CPDR: 30.0000 mg | DELAYED_RELEASE_CAPSULE | Freq: Every day | ORAL | Status: DC

## 2011-04-19 NOTE — Telephone Encounter (Signed)
refill 

## 2011-06-26 ENCOUNTER — Other Ambulatory Visit (INDEPENDENT_AMBULATORY_CARE_PROVIDER_SITE_OTHER): Payer: Medicare Other

## 2011-06-26 ENCOUNTER — Encounter: Payer: Self-pay | Admitting: Internal Medicine

## 2011-06-26 ENCOUNTER — Ambulatory Visit (INDEPENDENT_AMBULATORY_CARE_PROVIDER_SITE_OTHER): Payer: Medicare Other | Admitting: Internal Medicine

## 2011-06-26 DIAGNOSIS — IMO0001 Reserved for inherently not codable concepts without codable children: Secondary | ICD-10-CM

## 2011-06-26 DIAGNOSIS — E782 Mixed hyperlipidemia: Secondary | ICD-10-CM | POA: Diagnosis not present

## 2011-06-26 DIAGNOSIS — I10 Essential (primary) hypertension: Secondary | ICD-10-CM

## 2011-06-26 MED ORDER — ROSUVASTATIN CALCIUM 5 MG PO TABS: 5.0000 mg | ORAL_TABLET | Freq: Every day | ORAL | Status: DC

## 2011-06-26 MED ORDER — OLMESARTAN MEDOXOMIL 20 MG PO TABS: 20.0000 mg | ORAL_TABLET | Freq: Every day | ORAL | Status: DC

## 2011-06-26 NOTE — Assessment & Plan Note (Signed)
She does not want to take a "harsh drug" to lower her blood sugar but she does want to see a diabetic educator and a diabetes expert, she needs to start and ARB and a statin

## 2011-06-26 NOTE — Assessment & Plan Note (Signed)
Start benicar 

## 2011-06-26 NOTE — Progress Notes (Signed)
Subjective:    Patient ID: Brianna Mcdonald, female    DOB: 1933/04/04, 78 y.o.   MRN: [id number]  Diabetes She presents for her follow-up diabetic visit. She has type 2 diabetes mellitus. The initial diagnosis of diabetes was made 2 months ago. Her disease course has been stable. There are no hypoglycemic associated symptoms. Pertinent negatives for hypoglycemia include no dizziness, headaches, seizures, speech difficulty or tremors. Pertinent negatives for diabetes include no blurred vision, no chest pain, no fatigue, no foot paresthesias, no foot ulcerations, no polydipsia, no polyphagia, no polyuria, no visual change, no weakness and no weight loss. There are no hypoglycemic complications. Symptoms are stable. There are no diabetic complications. Current diabetic treatment includes diet. She is compliant with treatment all of the time. Her weight is stable. She is following a generally healthy diet. Meal planning includes avoidance of concentrated sweets. She has not had a previous visit with a dietician. She participates in exercise intermittently. There is no change in her home blood glucose trend. An ACE inhibitor/angiotensin II receptor blocker is not being taken. She does not see a podiatrist.Eye exam is current.      Review of Systems  Constitutional: Negative for fever, chills, weight loss, diaphoresis, activity change, appetite change, fatigue and unexpected weight change.  HENT: Negative.   Eyes: Negative.  Negative for blurred vision.  Respiratory: Negative for apnea, cough, chest tightness, shortness of breath, wheezing and stridor.   Cardiovascular: Negative for chest pain, palpitations and leg swelling.  Gastrointestinal: Negative for nausea, vomiting, abdominal pain, diarrhea, constipation and abdominal distention.  Genitourinary: Negative.  Negative for polyuria.  Musculoskeletal: Negative.   Skin: Negative.   Neurological: Negative for dizziness, tremors, seizures, syncope,  facial asymmetry, speech difficulty, weakness, light-headedness, numbness and headaches.  Hematological: Negative for polydipsia, polyphagia and adenopathy. Does not bruise/bleed easily.  Psychiatric/Behavioral: Negative.        Objective:   Physical Exam  Vitals reviewed. Constitutional: She is oriented to person, place, and time. She appears well-developed and well-nourished. No distress.  HENT:  Head: Normocephalic and atraumatic.  Mouth/Throat: Oropharynx is clear and moist. No oropharyngeal exudate.  Eyes: Conjunctivae are normal. Right eye exhibits no discharge. Left eye exhibits no discharge. No scleral icterus.  Neck: Normal range of motion. Neck supple. No JVD present. No tracheal deviation present. No thyromegaly present.  Cardiovascular: Normal rate, regular rhythm, normal heart sounds and intact distal pulses.  Exam reveals no gallop and no friction rub.   No murmur heard. Pulmonary/Chest: Effort normal and breath sounds normal. No stridor. No respiratory distress. She has no wheezes. She has no rales. She exhibits no tenderness.  Abdominal: Soft. Bowel sounds are normal. She exhibits no distension and no mass. There is no tenderness. There is no rebound and no guarding.  Musculoskeletal: Normal range of motion. She exhibits no edema and no tenderness.  Lymphadenopathy:    She has no cervical adenopathy.  Neurological: She is oriented to person, place, and time.  Skin: Skin is warm and dry. No rash noted. She is not diaphoretic. No erythema. No pallor.  Psychiatric: She has a normal mood and affect. Her behavior is normal. Judgment and thought content normal.     Lab Results  Component Value Date   WBC 4.4* 12/26/2010   HGB 12.6 12/26/2010   HCT 39.1 12/26/2010   PLT 175.0 12/26/2010   GLUCOSE 126* 04/07/2011   CHOL 218* 04/07/2011   TRIG 75.0 04/07/2011   HDL 74.50 04/07/2011   LDLDIRECT  120.6 04/07/2011   LDLCALC 106* 06/24/2010   ALT 15 04/07/2011   AST 24 04/07/2011   NA  142 04/07/2011   K 4.3 04/07/2011   CL 106 04/07/2011   CREATININE 0.9 04/07/2011   BUN 10 04/07/2011   CO2 31 04/07/2011   TSH 1.38 12/26/2010   HGBA1C 7.0* 04/07/2011       Assessment & Plan:

## 2011-06-26 NOTE — Assessment & Plan Note (Signed)
Start crestor 

## 2011-06-26 NOTE — Patient Instructions (Signed)

## 2011-07-03 ENCOUNTER — Encounter: Payer: Self-pay | Admitting: Cardiology

## 2011-07-03 ENCOUNTER — Ambulatory Visit (INDEPENDENT_AMBULATORY_CARE_PROVIDER_SITE_OTHER): Payer: Medicare Other | Admitting: Cardiology

## 2011-07-03 DIAGNOSIS — I10 Essential (primary) hypertension: Secondary | ICD-10-CM

## 2011-07-03 DIAGNOSIS — E7800 Pure hypercholesterolemia, unspecified: Secondary | ICD-10-CM | POA: Diagnosis not present

## 2011-07-03 DIAGNOSIS — E785 Hyperlipidemia, unspecified: Secondary | ICD-10-CM | POA: Insufficient documentation

## 2011-07-03 DIAGNOSIS — R002 Palpitations: Secondary | ICD-10-CM | POA: Diagnosis not present

## 2011-07-03 NOTE — Patient Instructions (Signed)
The current medical regimen is effective;  continue present plan and medications.  Please return fasting for lipid profile.  Follow up in 1 year with Dr Hochrein.  You will receive a letter in the mail 2 months before you are due.  Please call us when you receive this letter to schedule your follow up appointment.  

## 2011-07-03 NOTE — Assessment & Plan Note (Signed)
The patient understands the potential benefit of an ARB but does not one started at this point so she will hold off on that.

## 2011-07-03 NOTE — Progress Notes (Signed)
HPI The patient presents for evaluation of hypertension and dyslipidemia. I saw her previously for evaluation of palpitations. She had some chest discomfort with an atypical quality and apparently a negative perfusion study at [facility] greater than one year ago. I never received this report. However, she's no longer having chest discomfort. She said this was ascribed to a GI etiology. She's no longer having the palpitations that she had previously.  She recently saw her primary provider. Her blood pressure was upper limits of normal. Because of this and her diabetes it was suggested that she start Benicar. Also she had lipids drawn in December with a total of 218. It was suggested that she start Crestor. However, she was reluctant to start either of these. She says her blood pressure sometimes runs low and in fact I had seen systolics less than 110 and it is 110 today. In addition she wanted to have her lipids rechecked before considering a statin. She has not been having any chest pressure, neck or arm discomfort. She's not been having any presyncope or syncope. She has no new shortness of breath, PND or orthopnea.  Allergies  Allergen Reactions  . Propofol Anaphylaxis    Stopped breathing  . Aspirin   . Hydrochlorothiazide   . Moxifloxacin   . Penicillins   . Sulfonamide Derivatives     Current Outpatient Prescriptions  Medication Sig Dispense Refill  . CALCIUM-MAGNESUIUM-ZINC 333-133-8.3 MG TABS Take 1 tablet by mouth daily.        . Cyanocobalamin (VITAMIN B-12) 1000 MCG SUBL Place under the tongue.        Marland Kitchen desloratadine (CLARINEX) 5 MG tablet Take 1 tablet (5 mg total) by mouth daily.  90 tablet  3  . Ergocalciferol (VITAMIN D2) 400 UNITS TABS Take 1 tablet by mouth daily.        . fish oil-omega-3 fatty acids 1000 MG capsule Take 2 g by mouth daily.       . fluticasone (FLONASE) 50 MCG/ACT nasal spray Place 2 sprays into the nose daily.  16 g  11  . Folic Acid POWD by Does not apply route  daily.        . lansoprazole (PREVACID) 30 MG capsule Take 1 capsule (30 mg total) by mouth daily.  90 capsule  4  . metoprolol succinate (TOPROL-XL) 25 MG 24 hr tablet TAKE 1 TABLET DAILY  90 tablet  3  . niacin (NIASPAN) 500 MG CR tablet Take 500 mg by mouth. Take 1/2 in the am and one at bedtime       . NON FORMULARY Super K with Advanced K2 Complex. 1 at bedtime       . PROAIR HFA 108 (90 BASE) MCG/ACT inhaler INHALE 2 PUFFS BY MOUTH EVERY 6 HOURS IF NEEDED  25.5 g  0  . Selenium ([employer] SELENIUM) 200 MCG TABS Take 1 tablet by mouth.        . Strontium Chloride CRYS 1 tablet daily.          Past Medical History  Diagnosis Date  . Hypertension   . Allergy   . GERD (gastroesophageal reflux disease)   . Chest pain, unspecified   . Palpitations   . Edema   . Bronchitis, not specified as acute or chronic   . Irritable bowel syndrome   . Other specified disorders of liver   . Hyperlipidemia     Past Surgical History  Procedure Date  . Appendectomy   . Wrist surgery   .  Dilation and curettage of uterus     x 3    ROS:  As stated in the HPI and negative for all other systems.  PHYSICAL EXAM BP 110/70  Pulse 53  Ht 5\' 1"  (1.549 m)  Wt 153 lb (69.4 kg)  BMI 28.91 kg/m2 GENERAL:  Well appearing HEENT:  Pupils equal round and reactive, fundi not visualized, oral mucosa unremarkable NECK:  No jugular venous distention, waveform within normal limits, carotid upstroke brisk and symmetric, no bruits, no thyromegaly LYMPHATICS:  No cervical, inguinal adenopathy LUNGS:  Clear to auscultation bilaterally BACK:  No CVA tenderness CHEST:  Unremarkable HEART:  PMI not displaced or sustained,S1 and S2 within normal limits, no S3, no S4, no clicks, no rubs, no murmurs ABD:  Flat, positive bowel sounds normal in frequency in pitch, no bruits, no rebound, no guarding, no midline pulsatile mass, no hepatomegaly, no splenomegaly EXT:  2 plus pulses throughout, no edema, no cyanosis no  clubbing SKIN:  No rashes no nodules NEURO:  Cranial nerves II through XII grossly intact, motor grossly intact throughout [facility]:  Cognitively intact, oriented to person place and time'  EKG: Sinus rhythm, rate 53, axis within normal limits, intervals within normal limits, no acute ST-T wave changes.  ASSESSMENT AND PLAN

## 2011-07-03 NOTE — Assessment & Plan Note (Signed)
This is no longer a problem. No change in therapy is indicated.

## 2011-07-03 NOTE — Assessment & Plan Note (Signed)
She wants to have a repeat lipid profile prior to starting statin and I think this is reasonable. I will arrange this.

## 2011-07-05 ENCOUNTER — Telehealth: Payer: Self-pay | Admitting: *Deleted

## 2011-07-05 NOTE — Telephone Encounter (Signed)
Pt is req call back re: 06/26/11 labs.

## 2011-07-10 NOTE — Telephone Encounter (Signed)
Spoke with patient and aware of results  

## 2011-07-12 ENCOUNTER — Ambulatory Visit (INDEPENDENT_AMBULATORY_CARE_PROVIDER_SITE_OTHER): Payer: Medicare Other | Admitting: Endocrinology

## 2011-07-12 ENCOUNTER — Telehealth: Payer: Self-pay | Admitting: *Deleted

## 2011-07-12 ENCOUNTER — Other Ambulatory Visit (INDEPENDENT_AMBULATORY_CARE_PROVIDER_SITE_OTHER): Payer: Medicare Other

## 2011-07-12 ENCOUNTER — Encounter: Payer: Self-pay | Admitting: Endocrinology

## 2011-07-12 VITALS — BP 136/70 | HR 65 | Temp 97.2°F | Ht 61.0 in | Wt 152.0 lb

## 2011-07-12 DIAGNOSIS — E041 Nontoxic single thyroid nodule: Secondary | ICD-10-CM | POA: Diagnosis not present

## 2011-07-12 DIAGNOSIS — E785 Hyperlipidemia, unspecified: Secondary | ICD-10-CM

## 2011-07-12 LAB — LIPID PANEL
Cholesterol: 191 mg/dL (ref 0–200)
Total CHOL/HDL Ratio: 3
Triglycerides: 64 mg/dL (ref 0.0–149.0)

## 2011-07-12 MED ORDER — METFORMIN HCL ER 500 MG PO TB24: 500.0000 mg | ORAL_TABLET | Freq: Every day | ORAL | Status: DC

## 2011-07-12 NOTE — Telephone Encounter (Signed)
Needed lab order place in future statis/ completed.

## 2011-07-12 NOTE — Patient Instructions (Addendum)
good diet and exercise habits significanly improve the control of your diabetes.  please let me know if you wish to be referred to a dietician.  high blood sugar is very risky to your health.  you should see an eye doctor every year. controlling your blood pressure and cholesterol drastically reduces the damage diabetes does to your body.  this also applies to quitting smoking.  please discuss these with your doctor.  you should take an aspirin every day, unless you have been advised by a doctor not to. i have sent a prescription to your pharmacy, for a pill to help the blood sugar.   Please come back for a follow-up appointment in 4 months. Let's check a thyroid ultrasound.  you will receive a phone call, about a day and time for an appointment.

## 2011-07-12 NOTE — Progress Notes (Signed)
Subjective:    Patient ID: Brianna Mcdonald, female    DOB: [birth year], 78 y.o.   MRN: [id number]  HPI pt was recently noted to have hyperglycemia. she has never been on medication for DM.  pt says his diet and exercise are not good.   Pt states many years of slight dryness at both eyes, but no assoc visual loss. Past Medical History  Diagnosis Date  . Hypertension   . Allergy   . GERD (gastroesophageal reflux disease)   . Chest pain, unspecified   . Palpitations   . Edema   . Bronchitis, not specified as acute or chronic   . Irritable bowel syndrome   . Other specified disorders of liver   . Hyperlipidemia     Past Surgical History  Procedure Date  . Appendectomy   . Wrist surgery   . Dilation and curettage of uterus     x 3    History   Social History  . Marital Status: Widowed    Spouse Name: N/A    Number of Children: N/A  . Years of Education: N/A   Occupational History  . retired    Social History Main Topics  . Smoking status: Never Smoker   . Smokeless tobacco: Not on file  . Alcohol Use: No  . Drug Use: No  . Sexually Active: Not Currently   Other Topics Concern  . Not on file   Social History Narrative   Adopted 1 daughterLives aloneWidowedretired    Current Outpatient Prescriptions on File Prior to Visit  Medication Sig Dispense Refill  . CALCIUM-MAGNESUIUM-ZINC 333-133-8.3 MG TABS Take 1 tablet by mouth daily.        . Cyanocobalamin (VITAMIN B-12) 1000 MCG SUBL Place under the tongue.        Marland Kitchen desloratadine (CLARINEX) 5 MG tablet Take 1 tablet (5 mg total) by mouth daily.  90 tablet  3  . fish oil-omega-3 fatty acids 1000 MG capsule Take 2 g by mouth daily.       . fluticasone (FLONASE) 50 MCG/ACT nasal spray Place 2 sprays into the nose daily.  16 g  11  . Folic Acid POWD by Does not apply route daily.        . lansoprazole (PREVACID) 30 MG capsule Take 1 capsule (30 mg total) by mouth daily.  90 capsule  4  . metoprolol succinate  (TOPROL-XL) 25 MG 24 hr tablet TAKE 1 TABLET DAILY  90 tablet  3  . niacin (NIASPAN) 500 MG CR tablet Take 500 mg by mouth. Take 1/2 in the am and one at bedtime       . NON FORMULARY Super K with Advanced K2 Complex. 1 at bedtime       . PROAIR HFA 108 (90 BASE) MCG/ACT inhaler INHALE 2 PUFFS BY MOUTH EVERY 6 HOURS IF NEEDED  25.5 g  0  . Selenium ([employer] SELENIUM) 200 MCG TABS Take 1 tablet by mouth.        . Strontium Chloride CRYS 1 tablet daily.          Allergies  Allergen Reactions  . Propofol Anaphylaxis    Stopped breathing  . Aspirin   . Hydrochlorothiazide   . Moxifloxacin   . Penicillins   . Sulfonamide Derivatives     Family History  Problem Relation Age of Onset  . Asthma Mother   . Arthritis Mother     rheumatism  . Allergies Sister   . Drug abuse  Other   . Allergies Other   . Arthritis Other   DM: none  BP 136/70  Pulse 65  Temp(Src) 97.2 F (36.2 C) (Oral)  Wt 152 lb (68.947 kg)  SpO2 97%  Review of Systems denies weight loss, hearing loss, headache, chest pain, sob, n/v, urinary frequency, excessive diaphoresis, memory loss, depression, hypoglycemia, and easy bruising.   She has nocturnal leg cramps and rhinorrhea.      Objective:   Physical Exam VS: see vs page GEN: no distress HEAD: head: no deformity eyes: no periorbital swelling, no proptosis external nose and ears are normal mouth: no lesion seen NECK: supple, thyroid is not enlarged.  However, there is a 1 cm left thyroid nodule CHEST WALL: no deformity LUNGS:  Clear to auscultation CV: reg rate and rhythm, no murmur ABD: abdomen is soft, nontender.  no hepatosplenomegaly.  not distended.  no hernia MUSCULOSKELETAL: muscle bulk and strength are grossly normal.  no obvious joint swelling.  gait is normal and steady EXTEMITIES: no deformity.  no ulcer on the feet.  feet are of normal color and temp.  no edema PULSES: dorsalis pedis intact bilat.  no carotid bruit NEURO:  cn 2-12 grossly  intact.   readily moves all 4's.  sensation is intact to touch on the feet SKIN:  Normal texture and temperature.  No rash or suspicious lesion is visible.   NODES:  None palpable at the neck PSYCH: alert, oriented x3.  Does not appear anxious nor depressed.    Lab Results  Component Value Date   HGBA1C 7.0* 06/26/2011      Assessment & Plan:  DM, Needs increased rx, if it can be done with a regimen that avoids or minimizes hypoglycemia. Thyroid nodule, apparently new Dryness of the eyes, ? Due to DM

## 2011-07-14 ENCOUNTER — Encounter: Payer: Self-pay | Admitting: *Deleted

## 2011-07-14 ENCOUNTER — Telehealth: Payer: Self-pay | Admitting: Cardiology

## 2011-07-14 NOTE — Telephone Encounter (Signed)
Reviewed results of lipid profile with pt.  She is also concerned because she was given a glucometer to check her BS however no one showed her how to use it.  Instructed pt that I will come by her home tomorrow to teach her how to use it however she does need to take her medications as ordered.  She is concerned because the pharmacist told her that it could make her blood sugar too low.

## 2011-07-14 NOTE — Telephone Encounter (Signed)
Pt wants results of blood work, pls call [phone], pt requesting today

## 2011-07-14 NOTE — Telephone Encounter (Signed)
Mailbox unable to accept messages at this time.

## 2011-07-16 ENCOUNTER — Telehealth: Payer: Self-pay | Admitting: Nurse Practitioner

## 2011-07-16 NOTE — Telephone Encounter (Signed)
Pt called this AM stating that she only has 3 toprol tabs left and has not yet received her refills via mail order.  She has to leave early tomorrow AM to go to [facility] [place] a funeral and would like for her toprol to be called into her local pharmacy.  I called in a Rx for Metoprolol Succinate 25mg  i PO Daily, #30, six refills, to [facility] pharmacy @ [phone].

## 2011-07-17 ENCOUNTER — Ambulatory Visit: Payer: Medicare Other | Admitting: Cardiology

## 2011-07-18 ENCOUNTER — Other Ambulatory Visit (HOSPITAL_COMMUNITY): Payer: Medicare Other

## 2011-07-24 ENCOUNTER — Ambulatory Visit (HOSPITAL_COMMUNITY)
Admission: RE | Admit: 2011-07-24 | Discharge: 2011-07-24 | Disposition: A | Discharge status: Home / Self Care | Payer: Medicare Other | Source: Ambulatory Visit | Attending: Endocrinology | Admitting: Endocrinology

## 2011-07-24 ENCOUNTER — Encounter: Payer: Self-pay | Admitting: Endocrinology

## 2011-07-24 DIAGNOSIS — E041 Nontoxic single thyroid nodule: Secondary | ICD-10-CM | POA: Diagnosis not present

## 2011-07-25 ENCOUNTER — Telehealth: Payer: Self-pay | Admitting: *Deleted

## 2011-07-25 NOTE — Telephone Encounter (Signed)
Called pt to inform of thyroid US. Pt informed of results. (Letter also mailed to pt)

## 2011-08-02 ENCOUNTER — Encounter: Payer: Medicare Other | Attending: Endocrinology | Admitting: *Deleted

## 2011-08-02 ENCOUNTER — Encounter: Payer: Self-pay | Admitting: *Deleted

## 2011-08-02 VITALS — Ht 61.5 in | Wt 149.4 lb

## 2011-08-02 DIAGNOSIS — Z713 Dietary counseling and surveillance: Secondary | ICD-10-CM | POA: Diagnosis not present

## 2011-08-02 DIAGNOSIS — E119 Type 2 diabetes mellitus without complications: Secondary | ICD-10-CM | POA: Diagnosis not present

## 2011-08-02 NOTE — Patient Instructions (Signed)
Goals:  Follow Diabetes Meal Plan as instructed  Eat 3 meals a day and snacks if hungry  Limit carbohydrate intake to 30-45 grams carbohydrate/meal  Limit carbohydrate intake to 0-15 grams carbohydrate/snack  Use lean protein foods to meals/snacks  Monitor glucose levels as instructed by your doctor before breakfast  Aim for 15 mins of physical activity daily as able

## 2011-08-02 NOTE — Progress Notes (Signed)
Medical Nutrition Therapy:  Appt start time: 1700 end time:  1800.  Assessment:  Primary concerns today: patient here for initial diabetes education after being diagnosed 3 months ago. She states she is very motivated to take care of herself and recently her daughter and 2 grand children have been living with her and her food intake has been less healthy. She recently moved to [facility] from [place] where she was able to walk easily and prepare her own healthier foods. She enjoys gardening and being active in her church which is across the street from her home.  She has a meter and states she checks her BG every AM.  MEDICATIONS: see list    DIETARY INTAKE:  Usual eating pattern includes 3 meals and 0-2 snacks per day.  Everyday foods include good variety of all food groups.  Avoided foods include fried foods and sweets.    24-hr recall:  B ( AM): unsweetened cereal with fat free canned milk, banana, OR whole wheat bread OR baked sweet potato, hot green tea with Stevia Snk ( AM): occasionally boiled egg with toast  if no fresh fruit at breakfast  L ( PM): leftovers or sandwich with fruit, green tea Snk ( PM): fresh fruit occasionally D ( PM): lean meat or fish, starch, vegetables, occasionally bread, green tea Snk ( PM): hot milk with cocoa and stevia Beverages: green tea, hot cocoa  Usual physical activity: used to walk a lot in [place], not so much here in [place].  Estimated energy needs: 1200 calories 135 g carbohydrates 90 g protein 33 g fat  Progress Towards Goal(s):  In progress.   Nutritional Diagnosis:  NB-1.1 Food and nutrition-related knowledge deficit As related to new diagnosis of diabetes.  As evidenced by A1c of 7.0%.    Intervention:  Nutrition counseling and diabetes education provided. Covered basic physiology of diabetes, self-monitoring of BG, action of Metformin, signs, causes and treatment of high and low BG, Carb Counting and reading food labels Goals: Follow  Diabetes Meal Plan as instructed Eat 3 meals a day and snacks if hungry Limit carbohydrate intake to 30-45 grams carbohydrate/meal Limit carbohydrate intake to 0-15 grams carbohydrate/snack Use lean protein foods to meals/snacks Monitor glucose levels as instructed by your doctor before breakfast Aim for 15 mins of physical activity daily as able  Handouts given during visit include:  Living Well with Diabetes  Carb Counting and reading Food Labels  Meal Plan Card  Monitoring/Evaluation:  Dietary intake, exercise, reading food labels, and body weight prn.

## 2011-08-04 ENCOUNTER — Telehealth: Payer: Self-pay | Admitting: *Deleted

## 2011-08-04 DIAGNOSIS — E041 Nontoxic single thyroid nodule: Secondary | ICD-10-CM

## 2011-08-04 NOTE — Telephone Encounter (Signed)
Pt would like referral to [facility] for thyroid biopsy (phone # [phone])

## 2011-08-06 NOTE — Telephone Encounter (Signed)
done

## 2011-08-07 NOTE — Telephone Encounter (Signed)
Pt informed

## 2011-08-18 DIAGNOSIS — Z1231 Encounter for screening mammogram for malignant neoplasm of breast: Secondary | ICD-10-CM | POA: Diagnosis not present

## 2011-09-12 DIAGNOSIS — G4700 Insomnia, unspecified: Secondary | ICD-10-CM | POA: Diagnosis not present

## 2011-09-12 DIAGNOSIS — IMO0001 Reserved for inherently not codable concepts without codable children: Secondary | ICD-10-CM | POA: Diagnosis not present

## 2011-09-12 DIAGNOSIS — M76899 Other specified enthesopathies of unspecified lower limb, excluding foot: Secondary | ICD-10-CM | POA: Diagnosis not present

## 2011-09-12 DIAGNOSIS — M62838 Other muscle spasm: Secondary | ICD-10-CM | POA: Diagnosis not present

## 2011-10-13 ENCOUNTER — Emergency Department (HOSPITAL_COMMUNITY)
Admission: EM | Admit: 2011-10-13 | Discharge: 2011-10-13 | Disposition: A | Discharge status: Home / Self Care | Payer: Medicare Other | Attending: Emergency Medicine | Admitting: Emergency Medicine

## 2011-10-13 ENCOUNTER — Telehealth: Payer: Self-pay | Admitting: Internal Medicine

## 2011-10-13 ENCOUNTER — Emergency Department (HOSPITAL_COMMUNITY): Payer: Medicare Other

## 2011-10-13 DIAGNOSIS — I10 Essential (primary) hypertension: Secondary | ICD-10-CM | POA: Diagnosis not present

## 2011-10-13 DIAGNOSIS — S91009A Unspecified open wound, unspecified ankle, initial encounter: Secondary | ICD-10-CM | POA: Diagnosis not present

## 2011-10-13 DIAGNOSIS — S81009A Unspecified open wound, unspecified knee, initial encounter: Secondary | ICD-10-CM | POA: Diagnosis not present

## 2011-10-13 DIAGNOSIS — W010XXA Fall on same level from slipping, tripping and stumbling without subsequent striking against object, initial encounter: Secondary | ICD-10-CM | POA: Insufficient documentation

## 2011-10-13 DIAGNOSIS — S0031XA Abrasion of nose, initial encounter: Secondary | ICD-10-CM

## 2011-10-13 DIAGNOSIS — M25869 Other specified joint disorders, unspecified knee: Secondary | ICD-10-CM | POA: Diagnosis not present

## 2011-10-13 DIAGNOSIS — K589 Irritable bowel syndrome without diarrhea: Secondary | ICD-10-CM | POA: Diagnosis not present

## 2011-10-13 DIAGNOSIS — E119 Type 2 diabetes mellitus without complications: Secondary | ICD-10-CM | POA: Diagnosis not present

## 2011-10-13 DIAGNOSIS — S80219A Abrasion, unspecified knee, initial encounter: Secondary | ICD-10-CM

## 2011-10-13 DIAGNOSIS — IMO0002 Reserved for concepts with insufficient information to code with codable children: Secondary | ICD-10-CM | POA: Diagnosis not present

## 2011-10-13 DIAGNOSIS — Y92009 Unspecified place in unspecified non-institutional (private) residence as the place of occurrence of the external cause: Secondary | ICD-10-CM | POA: Insufficient documentation

## 2011-10-13 DIAGNOSIS — M79609 Pain in unspecified limb: Secondary | ICD-10-CM | POA: Diagnosis not present

## 2011-10-13 DIAGNOSIS — E785 Hyperlipidemia, unspecified: Secondary | ICD-10-CM | POA: Insufficient documentation

## 2011-10-13 DIAGNOSIS — K219 Gastro-esophageal reflux disease without esophagitis: Secondary | ICD-10-CM | POA: Diagnosis not present

## 2011-10-13 DIAGNOSIS — S8990XA Unspecified injury of unspecified lower leg, initial encounter: Secondary | ICD-10-CM | POA: Diagnosis not present

## 2011-10-13 DIAGNOSIS — Z882 Allergy status to sulfonamides status: Secondary | ICD-10-CM | POA: Insufficient documentation

## 2011-10-13 DIAGNOSIS — Z880 Allergy status to penicillin: Secondary | ICD-10-CM | POA: Insufficient documentation

## 2011-10-13 DIAGNOSIS — S99929A Unspecified injury of unspecified foot, initial encounter: Secondary | ICD-10-CM | POA: Diagnosis not present

## 2011-10-13 DIAGNOSIS — W19XXXA Unspecified fall, initial encounter: Secondary | ICD-10-CM

## 2011-10-13 MED ORDER — ACETAMINOPHEN 325 MG PO TABS
650.0000 mg | ORAL_TABLET | Freq: Once | ORAL | Status: AC
  Administered 2011-10-13: 650 mg via ORAL
  Filled 2011-10-13: qty 2

## 2011-10-13 NOTE — Discharge Instructions (Signed)
Your xrays were normal and did not show signs of any broken bones. Please keep your abrasions clean to allow good wound healing. Follow up with your primary care doctor as needed. Return to the ED with increased pain, numbness, weakness, nausea, vomiting, visual changes, or any other worrisome symptoms.  Abrasions Abrasions are skin scrapes. Their treatment depends on how large and deep the abrasion is. Abrasions do not extend through all layers of the skin. A cut or lesion through all skin layers is called a laceration. HOME CARE INSTRUCTIONS   If you were given a dressing, change it at least once a day or as instructed by your caregiver. If the bandage sticks, soak it off with a solution of water or hydrogen peroxide.   Twice a day, wash the area with soap and water to remove all the cream/ointment. You may do this in a sink, under a tub faucet, or in a shower. Rinse off the soap and pat dry with a clean towel. Look for signs of infection (see below).   Reapply cream/ointment according to your caregiver's instruction. This will help prevent infection and keep the bandage from sticking. Telfa or gauze over the wound and under the dressing or wrap will also help keep the bandage from sticking.   If the bandage becomes wet, dirty, or develops a foul smell, change it as soon as possible.   Only take over-the-counter or prescription medicines for pain, discomfort, or fever as directed by your caregiver.  SEEK IMMEDIATE MEDICAL CARE IF:   Increasing pain in the wound.   Signs of infection develop: redness, swelling, surrounding area is tender to touch, or pus coming from the wound.   You have a fever.   Any foul smell coming from the wound or dressing.  Most skin wounds heal within ten days. Facial wounds heal faster. However, an infection may occur despite proper treatment. You should have the wound checked for signs of infection within 24 to 48 hours or sooner if problems arise. If you were not  given a wound-check appointment, look closely at the wound yourself on the second day for early signs of infection listed above. MAKE SURE YOU:   Understand these instructions.   Will watch your condition.   Will get help right away if you are not doing well or get worse.  Document Released: 01/25/2005 Document Revised: 04/06/2011 Document Reviewed: 03/21/2011 [facility] Patient Information 2012 [place], [place].

## 2011-10-13 NOTE — ED Provider Notes (Signed)
Medical screening examination/treatment/procedure(s) were conducted as a shared visit with non-physician practitioner(s) and myself.  I personally evaluated the patient during the encounter  Mechanical fall while walking her trash can to the curb. She struck her nose. No loss of conscious. On no blood thinning medications. She has an abrasion to the bridge of her nose from her glasses. No epistaxis. No neurologic findings.  Dayton Bailiff, MD 10/13/11 1258

## 2011-10-13 NOTE — Telephone Encounter (Signed)
FYI:  Brianna Mcdonald walked in after falling at home.  She hit her face and head in the fall.  Per Dr Yetta Barre she is going to the ER to be checked out.
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
## 2011-10-13 NOTE — ED Provider Notes (Signed)
History     CSN: [id number]  Arrival date & time 10/13/11  4098   First MD Initiated Contact with Patient 10/13/11 1006      Chief Complaint  Patient presents with  . Fall    (Consider location/radiation/quality/duration/timing/severity/associated sxs/prior treatment) HPI Hx from pt. 78yo F with PMH HTN, DM presents after a fall this morning. She was taking her rolling cart out to the curb and tripped over a branch in her driveway. She fell onto her bilateral knees and the cart rolled out in front of her, causing her to fall onto her face. She suffered abrasions to the bridge of her nose; did not have a nosebleed with this. She did not suffer LOC; denies dizziness, nausea, vomiting. She is not anticoagulated. Currently c/o pain to the bilateral knees and her right hand.  Past Medical History  Diagnosis Date  . Hypertension   . Allergy   . GERD (gastroesophageal reflux disease)   . Chest pain, unspecified   . Palpitations   . Edema   . Bronchitis, not specified as acute or chronic   . Irritable bowel syndrome   . Other specified disorders of liver   . Hyperlipidemia   . Diabetes mellitus     Past Surgical History  Procedure Date  . Appendectomy   . Wrist surgery   . Dilation and curettage of uterus     x 3    Family History  Problem Relation Age of Onset  . Asthma Mother   . Arthritis Mother     rheumatism  . Allergies Sister   . Drug abuse Other   . Allergies Other   . Arthritis Other     History  Substance Use Topics  . Smoking status: Never Smoker   . Smokeless tobacco: Never Used  . Alcohol Use: No    OB History    Grav Para Term Preterm Abortions TAB SAB Ect Mult Living                  Review of Systems  Constitutional: Negative.   HENT: Negative for nosebleeds, facial swelling and neck pain.   Eyes: Negative for photophobia and visual disturbance.  Respiratory: Negative for chest tightness and shortness of breath.   Cardiovascular: Negative  for chest pain.  Gastrointestinal: Negative for nausea and vomiting.  Musculoskeletal: Positive for myalgias.  Skin: Positive for wound.  Neurological: Negative for dizziness, syncope, facial asymmetry, weakness, light-headedness and headaches.    Allergies  Aspirin; Moxifloxacin; Propofol; Hydrochlorothiazide; Penicillins; and Sulfonamide derivatives  Home Medications   Current Outpatient Rx  Name Route Sig Dispense Refill  . CALCIUM-MAGNESIUM-ZINC 333-133-8.3 MG PO TABS Oral Take 1 tablet by mouth daily.      Marland Kitchen VITAMIN D3 400 UNITS PO CAPS Oral Take 1 capsule by mouth daily.    Marland Kitchen VITAMIN B-12 1000 MCG SL SUBL Sublingual Place under the tongue.      . DESLORATADINE 5 MG PO TABS Oral Take 1 tablet (5 mg total) by mouth daily. 90 tablet 3  . OMEGA-3 FATTY ACIDS 1000 MG PO CAPS Oral Take 1 capsule by mouth daily.     Marland Kitchen FLUTICASONE PROPIONATE 50 MCG/ACT NA SUSP Nasal Place 2 sprays into the nose daily. 16 g 11  . FOLIC ACID 400 MCG PO TABS Oral Take 400 mcg by mouth daily.    Marland Kitchen LANSOPRAZOLE 30 MG PO CPDR Oral Take 1 capsule (30 mg total) by mouth daily. 90 capsule 4  . METOPROLOL SUCCINATE  ER 25 MG PO TB24  TAKE 1 TABLET DAILY 90 tablet 3  . NIACIN ER (ANTIHYPERLIPIDEMIC) 500 MG PO TBCR Oral Take 500 mg by mouth. Take 1/2 in the am and one at bedtime     . NON FORMULARY  Super K with Advanced K2 Complex. 1 at bedtime     . PROAIR HFA 108 (90 BASE) MCG/ACT IN AERS  INHALE 2 PUFFS BY MOUTH EVERY 6 HOURS IF NEEDED 25.5 g 0  . SELENIUM 200 MCG PO TABS Oral Take 1 tablet by mouth.      . STRONTIUM CHLORIDE CRYS  1 tablet daily.        BP 130/54  Pulse 52  Temp 98.3 F (36.8 C)  SpO2 99% HR low but pt on metoprolol  Physical Exam  Nursing note and vitals reviewed. Constitutional: She is oriented to person, place, and time. She appears well-developed and well-nourished. No distress.  HENT:  Head: Normocephalic. Head is without raccoon's eyes and without Battle's sign.        Superficial abrasions to bridge of nose, likely from pt's glasses; no evidence of intranasal bleeding or septal hematoma/deviation. No swelling noted to nose. Nontender to palpation over facial bones/jaw; no trismus.   Eyes: EOM are normal. Pupils are equal, round, and reactive to light.  Neck: Normal range of motion. Neck supple.  Cardiovascular: Normal rate, regular rhythm and normal heart sounds.   Pulmonary/Chest: Effort normal and breath sounds normal. She exhibits no tenderness.  Abdominal: Soft. Bowel sounds are normal. There is no tenderness. There is no rebound and no guarding.  Musculoskeletal: Normal range of motion.       Spine: No palpable stepoff, crepitus, or gross deformity appreciated. No appreciable spasm of paravertebral muscles. No midline tenderness.  Superficial abrasions to bilateral knees; no swelling, deformity, effusion, tenderness; FROM  Mild tenderness to palpation over ulnar aspect of dorsum of right hand; no swelling, deformity; FROM; NVI   Neurological: She is alert and oriented to person, place, and time.       Speech clear, pupils equal round reactive to light, extraocular movements intact   Normal peripheral visual fields Cranial nerves III through XII normal including no facial droop Follows commands, moves all extremities x4, normal strength to bilateral upper and lower extremities at all major muscle groups including grip Sensation normal to light touch Coordination intact, no limb ataxia, finger-nose-finger normal Rapid alternating movements normal No pronator drift Gait normal  Skin: Skin is warm and dry. She is not diaphoretic.  Psychiatric: She has a normal mood and affect.    ED Course  Procedures (including critical care time)  Labs Reviewed - No data to display No results found.   1. Fall   2. Abrasion, knee   3. Nose abrasion       MDM  Patient presents after a fall this morning which was mechanical. She is not anticoagulated,  denies LOC, and has had no symptoms to suggest worrisome head trauma. She does have a superficial abrasion to her nose but no evidence of intranasal trauma to suggest need for imaging. X-rays negative. Patient instructed on signs and symptoms that would prompt a return visit.  D/W Dr. Brooke Dare who saw pt with me.        Grant Fontana, PA-C 10/13/11 1136

## 2011-10-23 ENCOUNTER — Ambulatory Visit (INDEPENDENT_AMBULATORY_CARE_PROVIDER_SITE_OTHER): Payer: Medicare Other | Admitting: Internal Medicine

## 2011-10-23 ENCOUNTER — Other Ambulatory Visit (INDEPENDENT_AMBULATORY_CARE_PROVIDER_SITE_OTHER): Payer: Medicare Other

## 2011-10-23 ENCOUNTER — Encounter: Payer: Self-pay | Admitting: Internal Medicine

## 2011-10-23 VITALS — BP 138/66 | HR 53 | Temp 98.2°F | Resp 16 | Wt 146.0 lb

## 2011-10-23 DIAGNOSIS — J309 Allergic rhinitis, unspecified: Secondary | ICD-10-CM

## 2011-10-23 DIAGNOSIS — I10 Essential (primary) hypertension: Secondary | ICD-10-CM

## 2011-10-23 DIAGNOSIS — IMO0001 Reserved for inherently not codable concepts without codable children: Secondary | ICD-10-CM

## 2011-10-23 LAB — BASIC METABOLIC PANEL
Calcium: 9.5 mg/dL (ref 8.4–10.5)
GFR: 85.71 mL/min (ref >60.00)
Glucose, Bld: 114 mg/dL — ABNORMAL HIGH (ref 70–99)
Potassium: 5 mEq/L (ref 3.5–5.1)
Sodium: 143 mEq/L (ref 135–145)

## 2011-10-23 LAB — HEMOGLOBIN A1C: Hgb A1c MFr Bld: 6.5 % (ref 4.6–6.5)

## 2011-10-23 NOTE — Progress Notes (Signed)
Subjective:    Patient ID: Brianna Mcdonald, female    DOB: 11/18/1932, 78 y.o.   MRN: [id number]  Diabetes She presents for her follow-up diabetic visit. She has type 2 diabetes mellitus. Her disease course has been stable. There are no hypoglycemic associated symptoms. Pertinent negatives for hypoglycemia include no dizziness, headaches, pallor, seizures, speech difficulty or tremors. Pertinent negatives for diabetes include no blurred vision, no chest pain, no fatigue, no foot paresthesias, no foot ulcerations, no polydipsia, no polyphagia, no polyuria, no visual change, no weakness and no weight loss. There are no hypoglycemic complications. Symptoms are stable. There are no diabetic complications. Current diabetic treatment includes diet. She is compliant with treatment all of the time. She is following a generally healthy diet. Meal planning includes avoidance of concentrated sweets. She has not had a previous visit with a dietician. She participates in exercise intermittently. There is no change in her home blood glucose trend. An ACE inhibitor/angiotensin II receptor blocker is not being taken. She does not see a podiatrist.Eye exam is current.      Review of Systems  Constitutional: Negative for fever, chills, weight loss, diaphoresis, activity change, appetite change, fatigue and unexpected weight change.  HENT: Positive for congestion, rhinorrhea, sneezing and postnasal drip. Negative for hearing loss, ear pain, nosebleeds, sore throat, facial swelling, drooling, mouth sores, trouble swallowing, neck pain, neck stiffness, dental problem, tinnitus and ear discharge.   Eyes: Negative.  Negative for blurred vision.  Respiratory: Negative for cough, chest tightness, shortness of breath, wheezing and stridor.   Cardiovascular: Negative for chest pain, palpitations and leg swelling.  Gastrointestinal: Negative for nausea, vomiting, abdominal pain, diarrhea, constipation, blood in stool and  abdominal distention.  Genitourinary: Negative.  Negative for polyuria.  Musculoskeletal: Negative for myalgias, back pain, joint swelling, arthralgias and gait problem.  Skin: Negative for color change, pallor, rash and wound.  Neurological: Negative for dizziness, tremors, seizures, syncope, facial asymmetry, speech difficulty, weakness, light-headedness, numbness and headaches.  Hematological: Negative for polydipsia, polyphagia and adenopathy. Does not bruise/bleed easily.  Psychiatric/Behavioral: Negative.        Objective:   Physical Exam  Vitals reviewed. Constitutional: She is oriented to person, place, and time. She appears well-developed and well-nourished. No distress.  HENT:  Head: Normocephalic and atraumatic. Head is without raccoon's eyes, without Battle's sign, without abrasion, without contusion, without right periorbital erythema and without left periorbital erythema. No trismus in the jaw.  Right Ear: Hearing, tympanic membrane, external ear and ear canal normal.  Left Ear: Hearing, tympanic membrane, external ear and ear canal normal.  Nose: Nose normal. No mucosal edema, rhinorrhea, nose lacerations, sinus tenderness, nasal deformity, septal deviation or nasal septal hematoma. No epistaxis.  No foreign bodies. Right sinus exhibits no maxillary sinus tenderness and no frontal sinus tenderness. Left sinus exhibits no maxillary sinus tenderness and no frontal sinus tenderness.  Mouth/Throat: Oropharynx is clear and moist and mucous membranes are normal. Mucous membranes are not pale, not dry and not cyanotic. No uvula swelling. No oropharyngeal exudate, posterior oropharyngeal edema, posterior oropharyngeal erythema or tonsillar abscesses.  Eyes: Conjunctivae are normal. Right eye exhibits no discharge. Left eye exhibits no discharge. No scleral icterus.  Neck: Normal range of motion. Neck supple. No JVD present. No tracheal deviation present. No thyromegaly present.    Cardiovascular: Normal rate, regular rhythm, normal heart sounds and intact distal pulses.  Exam reveals no gallop and no friction rub.   No murmur heard. Pulmonary/Chest: Effort normal and breath  sounds normal. No stridor. No respiratory distress. She has no wheezes. She has no rales. She exhibits no tenderness.  Abdominal: Soft. Bowel sounds are normal. She exhibits no distension and no mass. There is no tenderness. There is no rebound and no guarding.  Musculoskeletal: Normal range of motion. She exhibits no edema and no tenderness.  Lymphadenopathy:    She has no cervical adenopathy.  Neurological: She is oriented to person, place, and time.  Skin: Skin is warm and dry. No rash noted. She is not diaphoretic. No erythema. No pallor.  Psychiatric: She has a normal mood and affect. Her behavior is normal. Judgment and thought content normal.     Lab Results  Component Value Date   WBC 4.4* 12/26/2010   HGB 12.6 12/26/2010   HCT 39.1 12/26/2010   PLT 175.0 12/26/2010   GLUCOSE 126* 04/07/2011   CHOL 191 07/12/2011   TRIG 64.0 07/12/2011   HDL 72.20 07/12/2011   LDLDIRECT 120.6 04/07/2011   LDLCALC 106* 07/12/2011   ALT 15 04/07/2011   AST 24 04/07/2011   NA 142 04/07/2011   K 4.3 04/07/2011   CL 106 04/07/2011   CREATININE 0.9 04/07/2011   BUN 10 04/07/2011   CO2 31 04/07/2011   TSH 1.38 12/26/2010   HGBA1C 7.0* 06/26/2011       Assessment & Plan:

## 2011-10-23 NOTE — Assessment & Plan Note (Signed)
Her BP is well controlled 

## 2011-10-23 NOTE — Assessment & Plan Note (Signed)
I will check her a1c today and will se if she needs to start meds for DM II

## 2011-10-23 NOTE — Patient Instructions (Signed)

## 2011-10-23 NOTE — Assessment & Plan Note (Signed)
She will restart flonase 

## 2011-10-25 DIAGNOSIS — E042 Nontoxic multinodular goiter: Secondary | ICD-10-CM | POA: Diagnosis not present

## 2011-11-07 ENCOUNTER — Telehealth: Payer: Self-pay

## 2011-11-07 NOTE — Telephone Encounter (Signed)
Pt requests a return call regarding results of recent labs.

## 2011-11-09 ENCOUNTER — Encounter: Payer: Self-pay | Admitting: Endocrinology

## 2011-11-09 ENCOUNTER — Ambulatory Visit (INDEPENDENT_AMBULATORY_CARE_PROVIDER_SITE_OTHER): Payer: Medicare Other | Admitting: Endocrinology

## 2011-11-09 VITALS — BP 118/62 | HR 55 | Temp 97.0°F | Wt 149.0 lb

## 2011-11-09 DIAGNOSIS — IMO0001 Reserved for inherently not codable concepts without codable children: Secondary | ICD-10-CM

## 2011-11-09 NOTE — Patient Instructions (Addendum)
good diet and exercise habits significanly improve the control of your diabetes.  please let me know if you wish to be referred to a dietician.  high blood sugar is very risky to your health.  you should see an eye doctor every year. Please come back for a follow-up appointment in 6 months.

## 2011-11-09 NOTE — Progress Notes (Signed)
Subjective:    Patient ID: Brianna Mcdonald, female    DOB: 17-Feb-1933, 78 y.o.   MRN: [id number]  HPI Pt was seen at [facility] 2 weeks ago for left thyroid nodule.  She was advised to return in 6 months for bx.  Pt returns for f/u of type 2 DM (dx'ed 2012; no known complications).  She did not tolerate metformin, due to hypoglycemia. Past Medical History  Diagnosis Date  . Hypertension   . Allergy   . GERD (gastroesophageal reflux disease)   . Chest pain, unspecified   . Palpitations   . Edema   . Bronchitis, not specified as acute or chronic   . Irritable bowel syndrome   . Other specified disorders of liver   . Hyperlipidemia   . Diabetes mellitus     Past Surgical History  Procedure Date  . Appendectomy   . Wrist surgery   . Dilation and curettage of uterus     x 3    History   Social History  . Marital Status: Widowed    Spouse Name: N/A    Number of Children: N/A  . Years of Education: N/A   Occupational History  . retired    Social History Main Topics  . Smoking status: Never Smoker   . Smokeless tobacco: Never Used  . Alcohol Use: No  . Drug Use: No  . Sexually Active: Not Currently   Other Topics Concern  . Not on file   Social History Narrative   Adopted 1 daughterLives aloneWidowedretired    Current Outpatient Prescriptions on File Prior to Visit  Medication Sig Dispense Refill  . CALCIUM-MAGNESUIUM-ZINC 333-133-8.3 MG TABS Take 1 tablet by mouth daily.        . Cholecalciferol (VITAMIN D3) 400 UNITS CAPS Take 1 capsule by mouth daily.      . Cyanocobalamin (VITAMIN B-12) 1000 MCG SUBL Place under the tongue.        Marland Kitchen desloratadine (CLARINEX) 5 MG tablet Take 1 tablet (5 mg total) by mouth daily.  90 tablet  3  . fish oil-omega-3 fatty acids 1000 MG capsule Take 1 capsule by mouth daily.       . fluticasone (FLONASE) 50 MCG/ACT nasal spray Place 2 sprays into the nose daily.  16 g  11  . folic acid (FOLVITE) 400 MCG tablet Take 400 mcg by mouth  daily.      . lansoprazole (PREVACID) 30 MG capsule Take 1 capsule (30 mg total) by mouth daily.  90 capsule  4  . metoprolol succinate (TOPROL-XL) 25 MG 24 hr tablet TAKE 1 TABLET DAILY  90 tablet  3  . niacin (NIASPAN) 500 MG CR tablet Take 500 mg by mouth. Take 1/2 in the am and one at bedtime       . NON FORMULARY Super K with Advanced K2 Complex. 1 at bedtime       . PROAIR HFA 108 (90 BASE) MCG/ACT inhaler INHALE 2 PUFFS BY MOUTH EVERY 6 HOURS IF NEEDED  25.5 g  0  . Selenium ([facility] SELENIUM) 200 MCG TABS Take 1 tablet by mouth.        . Strontium Chloride CRYS 1 tablet daily.          Allergies  Allergen Reactions  . Aspirin Shortness Of Breath  . Moxifloxacin Shortness Of Breath  . Propofol Anaphylaxis    Stopped breathing  . Hydrochlorothiazide Nausea And Vomiting  . Penicillins Itching  . Sulfonamide Derivatives Other (See  Comments)    unknown    Family History  Problem Relation Age of Onset  . Asthma Mother   . Arthritis Mother     rheumatism  . Allergies Sister   . Drug abuse Other   . Allergies Other   . Arthritis Other     BP 118/62  Pulse 55  Temp 97 F (36.1 C) (Oral)  Wt 149 lb (67.586 kg)  SpO2 97%   Review of Systems Denies weight change    Objective:   Physical Exam VITAL SIGNS:  See vs page GENERAL: no distress NECK: no change in the slight fullness at the left lower pole.   Lab Results  Component Value Date   HGBA1C 6.5 10/23/2011       Assessment & Plan:  DM, stable Thyroid nodule--is followed at [facility]

## 2011-11-10 NOTE — Telephone Encounter (Signed)
Closing phone note, pt seen in office for appt 11/09/11

## 2011-12-06 LAB — HM DIABETES EYE EXAM: HM Diabetic Eye Exam: NORMAL

## 2011-12-11 DIAGNOSIS — E041 Nontoxic single thyroid nodule: Secondary | ICD-10-CM | POA: Diagnosis not present

## 2011-12-11 DIAGNOSIS — E042 Nontoxic multinodular goiter: Secondary | ICD-10-CM | POA: Diagnosis not present

## 2011-12-22 DIAGNOSIS — H04129 Dry eye syndrome of unspecified lacrimal gland: Secondary | ICD-10-CM | POA: Diagnosis not present

## 2011-12-22 DIAGNOSIS — H2510 Age-related nuclear cataract, unspecified eye: Secondary | ICD-10-CM | POA: Diagnosis not present

## 2012-01-22 ENCOUNTER — Telehealth: Payer: Self-pay | Admitting: Cardiology

## 2012-01-22 NOTE — Telephone Encounter (Signed)
Patient called stated she has had tingling sensation in lf arm radiates up into lf shoulder for 3 days.States she already called PCP Dr.Jones and has appointment tomorrow 01/23/12.Advised to call back if needed.

## 2012-01-22 NOTE — Telephone Encounter (Signed)
New Problem:    Patient called in because she has been feeling tingling in her left arm for the past three days.  Patient has not experienced any other symptoms.  Please call back.

## 2012-01-22 NOTE — Telephone Encounter (Signed)
Patient called no answer.LMTC. 

## 2012-01-23 ENCOUNTER — Ambulatory Visit (INDEPENDENT_AMBULATORY_CARE_PROVIDER_SITE_OTHER)
Admission: RE | Admit: 2012-01-23 | Discharge: 2012-01-23 | Disposition: A | Discharge status: Home / Self Care | Payer: Medicare Other | Source: Ambulatory Visit | Attending: Internal Medicine | Admitting: Internal Medicine

## 2012-01-23 ENCOUNTER — Encounter: Payer: Self-pay | Admitting: Internal Medicine

## 2012-01-23 ENCOUNTER — Ambulatory Visit (INDEPENDENT_AMBULATORY_CARE_PROVIDER_SITE_OTHER): Payer: Medicare Other | Admitting: Internal Medicine

## 2012-01-23 VITALS — BP 138/86 | HR 58 | Temp 97.8°F | Resp 16 | Wt 149.5 lb

## 2012-01-23 DIAGNOSIS — M5030 Other cervical disc degeneration, unspecified cervical region: Secondary | ICD-10-CM | POA: Diagnosis not present

## 2012-01-23 DIAGNOSIS — Z23 Encounter for immunization: Secondary | ICD-10-CM | POA: Diagnosis not present

## 2012-01-23 DIAGNOSIS — M5412 Radiculopathy, cervical region: Secondary | ICD-10-CM

## 2012-01-23 DIAGNOSIS — M542 Cervicalgia: Secondary | ICD-10-CM

## 2012-01-23 DIAGNOSIS — R9389 Abnormal findings on diagnostic imaging of other specified body structures: Secondary | ICD-10-CM

## 2012-01-23 DIAGNOSIS — I10 Essential (primary) hypertension: Secondary | ICD-10-CM

## 2012-01-23 LAB — HM DIABETES FOOT EXAM: HM Diabetic Foot Exam: NORMAL

## 2012-01-23 NOTE — Progress Notes (Signed)
Subjective:    Patient ID: Brianna Mcdonald, female    DOB: 10-23-1932, 78 y.o.   MRN: [id number]  Neck Pain  This is a recurrent problem. The current episode started more than 1 month ago. The problem occurs intermittently. The problem has been gradually worsening. The pain is associated with nothing. The pain is present in the left side. The quality of the pain is described as stabbing and aching. The pain is at a severity of 3/10. The pain is mild. The symptoms are aggravated by bending and position. The pain is worse during the day. Stiffness is present all day. Associated symptoms include numbness (left arm) and tingling (left arm). Pertinent negatives include no chest pain, fever, headaches, pain with swallowing, paresis, photophobia, trouble swallowing, visual change or weakness. She has tried acetaminophen for the symptoms. The treatment provided moderate relief.      Review of Systems  Constitutional: Negative for fever.  HENT: Positive for congestion, rhinorrhea, sneezing, neck pain, neck stiffness and postnasal drip. Negative for hearing loss, nosebleeds, sore throat, facial swelling, drooling, mouth sores, trouble swallowing, dental problem, voice change, sinus pressure and tinnitus.   Eyes: Negative.  Negative for photophobia.  Respiratory: Negative.   Cardiovascular: Negative for chest pain, palpitations and leg swelling.  Gastrointestinal: Negative.   Genitourinary: Negative.   Musculoskeletal: Negative for myalgias, back pain, joint swelling, arthralgias and gait problem.  Skin: Negative.   Neurological: Positive for tingling (left arm) and numbness (left arm). Negative for dizziness, tremors, seizures, facial asymmetry, speech difficulty, weakness, light-headedness and headaches.  Hematological: Negative for adenopathy. Does not bruise/bleed easily.  Psychiatric/Behavioral: Negative.        Objective:   Physical Exam  Vitals reviewed. Constitutional: She is oriented to  person, place, and time. She appears well-developed and well-nourished. No distress.  HENT:  Head: Normocephalic and atraumatic.  Mouth/Throat: No oropharyngeal exudate.  Eyes: Conjunctivae normal are normal. Right eye exhibits no discharge. Left eye exhibits no discharge. No scleral icterus.  Neck: Normal range of motion. Neck supple. No JVD present. No tracheal deviation present. No thyromegaly present.  Cardiovascular: Normal rate, regular rhythm, normal heart sounds and intact distal pulses.  Exam reveals no gallop and no friction rub.   No murmur heard. Pulmonary/Chest: Effort normal and breath sounds normal. No stridor. No respiratory distress. She has no wheezes. She has no rales. She exhibits no tenderness.  Abdominal: Soft. Bowel sounds are normal. She exhibits no distension and no mass. There is no tenderness. There is no rebound and no guarding.  Musculoskeletal: Normal range of motion. She exhibits no edema and no tenderness.       Cervical back: Normal. She exhibits normal range of motion, no tenderness, no bony tenderness, no swelling, no edema, no deformity, no laceration, no pain, no spasm and normal pulse.  Lymphadenopathy:    She has no cervical adenopathy.  Neurological: She is alert and oriented to person, place, and time. She has normal strength. She displays no atrophy, no tremor and normal reflexes. No cranial nerve deficit or sensory deficit. She exhibits normal muscle tone. She displays a negative Romberg sign. She displays no seizure activity. Coordination and gait normal. She displays no Babinski's sign on the right side. She displays no Babinski's sign on the left side.  Reflex Scores:      Tricep reflexes are 0 on the right side and 0 on the left side.      Bicep reflexes are 0 on the right side and  0 on the left side.      Brachioradialis reflexes are 0 on the right side and 0 on the left side.      Patellar reflexes are 1+ on the right side and 1+ on the left side.       Achilles reflexes are 1+ on the right side and 1+ on the left side. Skin: Skin is warm and dry. No rash noted. She is not diaphoretic. No erythema. No pallor.  Psychiatric: She has a normal mood and affect. Her behavior is normal. Judgment and thought content normal.      Lab Results  Component Value Date   WBC 4.4* 12/26/2010   HGB 12.6 12/26/2010   HCT 39.1 12/26/2010   PLT 175.0 12/26/2010   GLUCOSE 114* 10/23/2011   CHOL 191 07/12/2011   TRIG 64.0 07/12/2011   HDL 72.20 07/12/2011   LDLDIRECT 120.6 04/07/2011   LDLCALC 106* 07/12/2011   ALT 15 04/07/2011   AST 24 04/07/2011   NA 143 10/23/2011   K 5.0 10/23/2011   CL 107 10/23/2011   CREATININE 0.8 10/23/2011   BUN 11 10/23/2011   CO2 30 10/23/2011   TSH 1.38 12/26/2010   HGBA1C 6.5 10/23/2011      Assessment & Plan:

## 2012-01-23 NOTE — Patient Instructions (Signed)
Torticollis, Acute You have suddenly (acutely) developed a twisted neck (torticollis). This is usually a self-limited condition. CAUSES  Acute torticollis may be caused by malposition, trauma or infection. Most commonly, acute torticollis is caused by sleeping in an awkward position. Torticollis may also be caused by the flexion, extension or twisting of the neck muscles beyond their normal position. Sometimes, the exact cause may not be known. SYMPTOMS  Usually, there is pain and limited movement of the neck. Your neck may twist to one side. DIAGNOSIS  The diagnosis is often made by physical examination. X-rays, CT scans or MRIs may be done if there is a history of trauma or concern of infection. TREATMENT  For a common, stiff neck that develops during sleep, treatment is focused on relaxing the contracted neck muscle. Medications (including shots) may be used to treat the problem. Most cases resolve in several days. Torticollis usually responds to conservative physical therapy. If left untreated, the shortened and spastic neck muscle can cause deformities in the face and neck. Rarely, surgery is required. HOME CARE INSTRUCTIONS   Use over-the-counter and prescription medications as directed by your caregiver.   Do stretching exercises and massage the neck as directed by your caregiver.   Follow up with physical therapy if needed and as directed by your caregiver.  SEEK IMMEDIATE MEDICAL CARE IF:   You develop difficulty breathing or noisy breathing (stridor).   You drool, develop trouble swallowing or have pain with swallowing.   You develop numbness or weakness in the hands or feet.   You have changes in speech or vision.   You have problems with urination or bowel movements.   You have difficulty walking.   You have a fever.   You have increased pain.  MAKE SURE YOU:   Understand these instructions.   Will watch your condition.   Will get help right away if you are not  doing well or get worse.  Document Released: 04/14/2000 Document Revised: 04/06/2011 Document Reviewed: 05/26/2009 [employer] Patient Information 2012 [employer]. 

## 2012-01-24 DIAGNOSIS — Z23 Encounter for immunization: Secondary | ICD-10-CM | POA: Insufficient documentation

## 2012-01-24 DIAGNOSIS — R9389 Abnormal findings on diagnostic imaging of other specified body structures: Secondary | ICD-10-CM | POA: Insufficient documentation

## 2012-01-24 NOTE — Assessment & Plan Note (Signed)
Her BP is well controlled 

## 2012-01-24 NOTE — Assessment & Plan Note (Signed)
Plain films are mildly abnormal so I have asked her to get an MRI done to see if she has spinal stenosis, nerve impingement, bone spurs, etc.

## 2012-01-24 NOTE — Assessment & Plan Note (Signed)
MRI ordered

## 2012-01-24 NOTE — Assessment & Plan Note (Signed)
She does not want anything for pain

## 2012-02-05 ENCOUNTER — Other Ambulatory Visit: Payer: Self-pay | Admitting: Internal Medicine

## 2012-02-05 ENCOUNTER — Telehealth: Payer: Self-pay | Admitting: Cardiology

## 2012-02-05 ENCOUNTER — Other Ambulatory Visit: Payer: Self-pay | Admitting: Cardiology

## 2012-02-05 NOTE — Telephone Encounter (Signed)
rx sent pharmacy. Pt notified.

## 2012-02-05 NOTE — Telephone Encounter (Signed)
New Problem:    Patient called in needing a renewal of her 90 day refills of her metoprolol succinate (TOPROL-XL) 25 MG 24 hr tablet faxed into the pharmacy on file.

## 2012-02-13 ENCOUNTER — Encounter: Payer: Self-pay | Admitting: Internal Medicine

## 2012-02-13 ENCOUNTER — Other Ambulatory Visit (INDEPENDENT_AMBULATORY_CARE_PROVIDER_SITE_OTHER): Payer: Medicare Other

## 2012-02-13 ENCOUNTER — Ambulatory Visit (INDEPENDENT_AMBULATORY_CARE_PROVIDER_SITE_OTHER): Payer: Medicare Other | Admitting: Internal Medicine

## 2012-02-13 VITALS — BP 118/54 | HR 53 | Temp 97.5°F | Resp 16 | Ht 61.0 in | Wt 152.0 lb

## 2012-02-13 DIAGNOSIS — IMO0001 Reserved for inherently not codable concepts without codable children: Secondary | ICD-10-CM

## 2012-02-13 DIAGNOSIS — E041 Nontoxic single thyroid nodule: Secondary | ICD-10-CM

## 2012-02-13 DIAGNOSIS — I10 Essential (primary) hypertension: Secondary | ICD-10-CM

## 2012-02-13 DIAGNOSIS — M542 Cervicalgia: Secondary | ICD-10-CM

## 2012-02-13 DIAGNOSIS — R9389 Abnormal findings on diagnostic imaging of other specified body structures: Secondary | ICD-10-CM | POA: Diagnosis not present

## 2012-02-13 DIAGNOSIS — M5412 Radiculopathy, cervical region: Secondary | ICD-10-CM

## 2012-02-13 DIAGNOSIS — M8580 Other specified disorders of bone density and structure, unspecified site: Secondary | ICD-10-CM

## 2012-02-13 DIAGNOSIS — M899 Disorder of bone, unspecified: Secondary | ICD-10-CM | POA: Insufficient documentation

## 2012-02-13 DIAGNOSIS — M810 Age-related osteoporosis without current pathological fracture: Secondary | ICD-10-CM | POA: Insufficient documentation

## 2012-02-13 LAB — COMPREHENSIVE METABOLIC PANEL WITH GFR
ALT: 19 U/L (ref 0–35)
AST: 24 U/L (ref 0–37)
Albumin: 3.2 g/dL — ABNORMAL LOW (ref 3.5–5.2)
Alkaline Phosphatase: 87 U/L (ref 39–117)
BUN: 17 mg/dL (ref 6–23)
CO2: 29 meq/L (ref 19–32)
Calcium: 9.3 mg/dL (ref 8.4–10.5)
Chloride: 104 meq/L (ref 96–112)
Creatinine, Ser: 0.8 mg/dL (ref 0.4–1.2)
GFR: 86.85 mL/min
Glucose, Bld: 147 mg/dL — ABNORMAL HIGH (ref 70–99)
Potassium: 5.1 meq/L (ref 3.5–5.1)
Sodium: 139 meq/L (ref 135–145)
Total Bilirubin: 0.6 mg/dL (ref 0.3–1.2)
Total Protein: 7 g/dL (ref 6.0–8.3)

## 2012-02-13 LAB — URINALYSIS, ROUTINE W REFLEX MICROSCOPIC
Bilirubin Urine: NEGATIVE
Hgb urine dipstick: NEGATIVE
Ketones, ur: NEGATIVE
Nitrite: NEGATIVE
Specific Gravity, Urine: 1.02
Total Protein, Urine: NEGATIVE
Urine Glucose: NEGATIVE
Urobilinogen, UA: 0.2
pH: 6.5 (ref 5.0–8.0)

## 2012-02-13 LAB — HEMOGLOBIN A1C: Hgb A1c MFr Bld: 6.5 % (ref 4.6–6.5)

## 2012-02-13 NOTE — Patient Instructions (Signed)

## 2012-02-13 NOTE — Progress Notes (Signed)
Subjective:    Patient ID: Arlice Colt, female    DOB: 1932-09-16, 78 y.o.   MRN: [id number]  Neck Pain  This is a recurrent problem. The current episode started more than 1 month ago. The problem occurs intermittently. The problem has been unchanged. The pain is associated with nothing. The pain is present in the left side. The quality of the pain is described as aching. The pain is at a severity of 3/10. The pain is mild. The symptoms are aggravated by twisting. The pain is same all the time. Associated symptoms include tingling (in her left arm). Pertinent negatives include no chest pain, fever, headaches, leg pain, numbness, pain with swallowing, paresis, photophobia, syncope, trouble swallowing, visual change, weakness or weight loss. She has tried acetaminophen for the symptoms. The treatment provided moderate relief.      Review of Systems  Constitutional: Negative for fever, chills, weight loss, diaphoresis, activity change, appetite change, fatigue and unexpected weight change.  HENT: Positive for neck pain. Negative for sore throat, facial swelling, trouble swallowing, neck stiffness and voice change.   Eyes: Negative.  Negative for photophobia.  Respiratory: Negative for cough, chest tightness, shortness of breath, wheezing and stridor.   Cardiovascular: Negative for chest pain, palpitations, leg swelling and syncope.  Gastrointestinal: Negative for nausea, vomiting, abdominal pain, diarrhea, constipation and blood in stool.  Genitourinary: Negative.   Musculoskeletal: Negative for myalgias, back pain, joint swelling, arthralgias and gait problem.  Skin: Negative for color change, pallor, rash and wound.  Neurological: Positive for tingling (in her left arm). Negative for dizziness, tremors, seizures, syncope, facial asymmetry, speech difficulty, weakness, light-headedness, numbness and headaches.  Hematological: Negative for adenopathy. Does not bruise/bleed easily.    Psychiatric/Behavioral: Negative.        Objective:   Physical Exam  Vitals reviewed. Constitutional: She is oriented to person, place, and time. She appears well-developed and well-nourished. No distress.  HENT:  Head: Normocephalic and atraumatic.  Mouth/Throat: Oropharynx is clear and moist. No oropharyngeal exudate.  Eyes: Conjunctivae normal are normal. Right eye exhibits no discharge. Left eye exhibits no discharge. No scleral icterus.  Neck: Normal range of motion. Neck supple. No JVD present. No tracheal deviation present. Mass present. No thyromegaly present.    Cardiovascular: Normal rate, regular rhythm, normal heart sounds and intact distal pulses.  Exam reveals no gallop and no friction rub.   No murmur heard. Pulmonary/Chest: Effort normal and breath sounds normal. No stridor. No respiratory distress. She has no wheezes. She has no rales. She exhibits no tenderness.  Abdominal: Soft. Bowel sounds are normal. She exhibits no distension and no mass. There is no tenderness. There is no rebound and no guarding.  Musculoskeletal: Normal range of motion. She exhibits no edema and no tenderness.       Cervical back: Normal. She exhibits normal range of motion, no tenderness, no bony tenderness, no swelling, no edema, no deformity, no laceration, no pain, no spasm and normal pulse.  Lymphadenopathy:    She has no cervical adenopathy.  Neurological: She is alert and oriented to person, place, and time. She has normal strength. She displays no atrophy, no tremor and normal reflexes. No cranial nerve deficit or sensory deficit. She exhibits normal muscle tone. She displays a negative Romberg sign. She displays no seizure activity. Coordination and gait normal. She displays no Babinski's sign on the right side. She displays no Babinski's sign on the left side.  Reflex Scores:      Tricep  reflexes are 1+ on the right side and 1+ on the left side.      Bicep reflexes are 1+ on the right  side and 1+ on the left side.      Brachioradialis reflexes are 1+ on the right side and 1+ on the left side.      Patellar reflexes are 1+ on the right side and 1+ on the left side.      Achilles reflexes are 1+ on the right side and 1+ on the left side. Skin: Skin is warm and dry. No rash noted. She is not diaphoretic. No erythema. No pallor.  Psychiatric: She has a normal mood and affect. Her behavior is normal. Judgment and thought content normal.     Lab Results  Component Value Date   WBC 4.4* 12/26/2010   HGB 12.6 12/26/2010   HCT 39.1 12/26/2010   PLT 175.0 12/26/2010   GLUCOSE 114* 10/23/2011   CHOL 191 07/12/2011   TRIG 64.0 07/12/2011   HDL 72.20 07/12/2011   LDLDIRECT 120.6 04/07/2011   LDLCALC 106* 07/12/2011   ALT 15 04/07/2011   AST 24 04/07/2011   NA 143 10/23/2011   K 5.0 10/23/2011   CL 107 10/23/2011   CREATININE 0.8 10/23/2011   BUN 11 10/23/2011   CO2 30 10/23/2011   TSH 1.38 12/26/2010   HGBA1C 6.5 10/23/2011   Dg Cervical Spine Complete  01/23/2012  *RADIOLOGY REPORT*  Clinical Data: Left arm tingling  CERVICAL SPINE - COMPLETE 4+ VIEW  Comparison: None.  Findings: Five views of the cervical spine submitted.  No acute fracture or subluxation.  Mild disc space flattening with mild anterior and mild posterior spurring at C5-C6 and C6-C7 level.  Mild bilateral narrowing of the neural foramina at C5-C6 and C6-C7 level.  C1-C2 relationship is unremarkable.  No prevertebral soft tissue swelling.  Cervical airway is patent.  IMPRESSION: No acute fracture or subluxation.  Mild degenerative changes at C5- C6 and C6-7 level.   Original Report Authenticated By: Natasha Mead, M.D.      Assessment & Plan:

## 2012-02-15 ENCOUNTER — Encounter: Payer: Self-pay | Admitting: Internal Medicine

## 2012-02-15 NOTE — Assessment & Plan Note (Signed)
I have asked her to get an MRI done to see if she has spinal stenosis, hnp, nerve impingement, tumor, etc.

## 2012-02-15 NOTE — Assessment & Plan Note (Signed)
I will check her a1c and will monitor her renal function 

## 2012-02-15 NOTE — Assessment & Plan Note (Signed)
Her BP is well controlled 

## 2012-02-15 NOTE — Assessment & Plan Note (Signed)
MRI

## 2012-02-22 ENCOUNTER — Ambulatory Visit: Payer: Medicare Other | Admitting: Internal Medicine

## 2012-03-02 ENCOUNTER — Other Ambulatory Visit: Payer: Medicare Other

## 2012-03-07 DIAGNOSIS — M19049 Primary osteoarthritis, unspecified hand: Secondary | ICD-10-CM | POA: Diagnosis not present

## 2012-03-07 DIAGNOSIS — M5030 Other cervical disc degeneration, unspecified cervical region: Secondary | ICD-10-CM | POA: Diagnosis not present

## 2012-03-07 DIAGNOSIS — IMO0001 Reserved for inherently not codable concepts without codable children: Secondary | ICD-10-CM | POA: Diagnosis not present

## 2012-03-07 DIAGNOSIS — M5137 Other intervertebral disc degeneration, lumbosacral region: Secondary | ICD-10-CM | POA: Diagnosis not present

## 2012-03-20 DIAGNOSIS — M810 Age-related osteoporosis without current pathological fracture: Secondary | ICD-10-CM | POA: Diagnosis not present

## 2012-03-22 ENCOUNTER — Telehealth: Payer: Self-pay | Admitting: Internal Medicine

## 2012-03-22 MED ORDER — DESLORATADINE 5 MG PO TABS: 5.0000 mg | ORAL_TABLET | Freq: Every day | ORAL | Status: DC

## 2012-03-22 NOTE — Telephone Encounter (Signed)
Pt needs a refill on Clarinex.  She states she no longer sees Dr. Maple Hudson, who wrote it last.  Pt uses [facility] mail order.  She needs a 30 day sent to [facility] on [address] because she is out.

## 2012-04-05 DIAGNOSIS — M5030 Other cervical disc degeneration, unspecified cervical region: Secondary | ICD-10-CM | POA: Diagnosis not present

## 2012-04-05 DIAGNOSIS — IMO0001 Reserved for inherently not codable concepts without codable children: Secondary | ICD-10-CM | POA: Diagnosis not present

## 2012-04-05 DIAGNOSIS — M810 Age-related osteoporosis without current pathological fracture: Secondary | ICD-10-CM | POA: Diagnosis not present

## 2012-04-05 DIAGNOSIS — M5137 Other intervertebral disc degeneration, lumbosacral region: Secondary | ICD-10-CM | POA: Diagnosis not present

## 2012-04-15 ENCOUNTER — Other Ambulatory Visit: Payer: Self-pay

## 2012-04-15 DIAGNOSIS — J309 Allergic rhinitis, unspecified: Secondary | ICD-10-CM

## 2012-04-15 MED ORDER — FLUTICASONE PROPIONATE 50 MCG/ACT NA SUSP: 2.0000 | Freq: Every day | NASAL | Status: DC

## 2012-05-06 ENCOUNTER — Ambulatory Visit: Payer: Medicare Other | Admitting: Endocrinology

## 2012-05-10 ENCOUNTER — Other Ambulatory Visit: Payer: Self-pay | Admitting: Internal Medicine

## 2012-05-14 ENCOUNTER — Other Ambulatory Visit: Payer: Self-pay | Admitting: *Deleted

## 2012-05-14 MED ORDER — LANSOPRAZOLE 30 MG PO CPDR: 30.0000 mg | DELAYED_RELEASE_CAPSULE | Freq: Every day | ORAL | Status: DC

## 2012-05-14 NOTE — Telephone Encounter (Signed)
Patient medication lansoprazole called to [facility]  For one month supply due to mail order unable to get to her in time.

## 2012-05-16 DIAGNOSIS — M79609 Pain in unspecified limb: Secondary | ICD-10-CM | POA: Diagnosis not present

## 2012-05-16 DIAGNOSIS — R262 Difficulty in walking, not elsewhere classified: Secondary | ICD-10-CM | POA: Diagnosis not present

## 2012-05-16 DIAGNOSIS — M5030 Other cervical disc degeneration, unspecified cervical region: Secondary | ICD-10-CM | POA: Diagnosis not present

## 2012-05-16 DIAGNOSIS — M5137 Other intervertebral disc degeneration, lumbosacral region: Secondary | ICD-10-CM | POA: Diagnosis not present

## 2012-06-05 DIAGNOSIS — M79609 Pain in unspecified limb: Secondary | ICD-10-CM | POA: Diagnosis not present

## 2012-06-05 DIAGNOSIS — M5137 Other intervertebral disc degeneration, lumbosacral region: Secondary | ICD-10-CM | POA: Diagnosis not present

## 2012-06-05 DIAGNOSIS — M5030 Other cervical disc degeneration, unspecified cervical region: Secondary | ICD-10-CM | POA: Diagnosis not present

## 2012-06-05 DIAGNOSIS — R262 Difficulty in walking, not elsewhere classified: Secondary | ICD-10-CM | POA: Diagnosis not present

## 2012-06-20 ENCOUNTER — Encounter: Payer: Self-pay | Admitting: Obstetrics and Gynecology

## 2012-06-20 ENCOUNTER — Ambulatory Visit: Payer: Medicare Other | Admitting: Obstetrics and Gynecology

## 2012-06-20 VITALS — BP 120/60 | HR 70 | Ht 60.75 in | Wt 152.0 lb

## 2012-06-20 DIAGNOSIS — Z124 Encounter for screening for malignant neoplasm of cervix: Secondary | ICD-10-CM | POA: Diagnosis not present

## 2012-06-20 DIAGNOSIS — R350 Frequency of micturition: Secondary | ICD-10-CM | POA: Diagnosis not present

## 2012-06-20 DIAGNOSIS — R102 Pelvic and perineal pain: Secondary | ICD-10-CM

## 2012-06-20 NOTE — Progress Notes (Signed)
Contraception post menopausal  Last pap 05/08/08 Last Mammo 08/17/10 Last Colonoscopy 2012 per pt Last Dexa Scan 06/16/09 Primary MD Dr. Sanda Linger  Abuse at Home no   C/o frequency recently and burning drinks a lot of decaf tea  Filed Vitals:   06/20/12 0939  BP: 120/60  Pulse: 70   ROS: noncontributory  Physical Examination: General appearance - alert, well appearing, and in no distress Neck - supple, no significant adenopathy Chest - clear to auscultation, no wheezes, rales or rhonchi, symmetric air entry Heart - normal rate and regular rhythm Abdomen - soft, nontender, nondistended, no masses or organomegaly Breasts - breasts appear normal, no suspicious masses, no skin or nipple changes or axillary nodes Pelvic - normal external genitalia, vulva, vagina, cervix, uterus and adnexa, bil adnexal discomfort, atrophy in vagina Back exam - no CVAT Extremities - no edema, redness or tenderness in the calves or thighs  A/P Next available for pelvic u/s secondary to discomfort and difficulty palpating ovaries mammo later this yr UCx Drink less tea and inc water Pap today but no further paps needed

## 2012-06-21 LAB — PAP IG W/ RFLX HPV ASCU

## 2012-06-24 DIAGNOSIS — M5137 Other intervertebral disc degeneration, lumbosacral region: Secondary | ICD-10-CM | POA: Diagnosis not present

## 2012-06-24 DIAGNOSIS — M79609 Pain in unspecified limb: Secondary | ICD-10-CM | POA: Diagnosis not present

## 2012-06-24 DIAGNOSIS — M5030 Other cervical disc degeneration, unspecified cervical region: Secondary | ICD-10-CM | POA: Diagnosis not present

## 2012-06-24 DIAGNOSIS — R262 Difficulty in walking, not elsewhere classified: Secondary | ICD-10-CM | POA: Diagnosis not present

## 2012-06-26 DIAGNOSIS — M5137 Other intervertebral disc degeneration, lumbosacral region: Secondary | ICD-10-CM | POA: Diagnosis not present

## 2012-06-26 DIAGNOSIS — M79609 Pain in unspecified limb: Secondary | ICD-10-CM | POA: Diagnosis not present

## 2012-06-26 DIAGNOSIS — R262 Difficulty in walking, not elsewhere classified: Secondary | ICD-10-CM | POA: Diagnosis not present

## 2012-06-26 DIAGNOSIS — M5030 Other cervical disc degeneration, unspecified cervical region: Secondary | ICD-10-CM | POA: Diagnosis not present

## 2012-06-28 ENCOUNTER — Telehealth: Payer: Self-pay | Admitting: Obstetrics and Gynecology

## 2012-06-28 NOTE — Telephone Encounter (Signed)
Spoke with Brianna Mcdonald rgd msg. Brianna Mcdonald stated she still feels like she has UTI symptoms. Brianna Mcdonald stated she left urine @ her AEX . Looked in labs and does not look like it was sent for culture. Made app for Brianna Mcdonald for 07/02/2012 with lcarter @ 11:30. Advised Brianna Mcdonald to drink plenty of water and drink some cranberry juice. Brianna Mcdonald's voice understanding.

## 2012-07-01 ENCOUNTER — Ambulatory Visit: Payer: Medicare Other | Admitting: Family Medicine

## 2012-07-01 ENCOUNTER — Encounter: Payer: Self-pay | Admitting: Family Medicine

## 2012-07-01 VITALS — BP 110/62 | Temp 98.5°F | Ht 61.0 in | Wt 152.0 lb

## 2012-07-01 DIAGNOSIS — R3915 Urgency of urination: Secondary | ICD-10-CM | POA: Diagnosis not present

## 2012-07-01 LAB — POCT URINALYSIS DIPSTICK
Bilirubin, UA: NEGATIVE
Blood, UA: NEGATIVE
Glucose, UA: NEGATIVE
Ketones, UA: NEGATIVE
Nitrite, UA: NEGATIVE
Spec Grav, UA: <=1.005
Urobilinogen, UA: NEGATIVE
pH, UA: 5

## 2012-07-01 MED ORDER — NITROFURANTOIN MONOHYD MACRO 100 MG PO CAPS: 100.0000 mg | ORAL_CAPSULE | Freq: Two times a day (BID) | ORAL | Status: DC

## 2012-07-01 NOTE — Progress Notes (Signed)
S: patient says reports no improvements in symptoms since last visit on 2/20.  Continues with frequently, burning, with pink on toilet paper when she wipes, urine was less cloudy and strong odor.  Has increase water mixed with cinnamon and all spice.  Has decreased drinking teas.  Denies fever, but mild chills.  Denies N/V.  Fatigued x 24 hours.  Denies back pain or abdominal pain.  No history of kidney stones, but a "leaky bladder" and a few UTIs in past.     O: Patient sitting on exam table in NAD.  Afebrile.  No CVAT, abdomen soft, nondistended, no tenderness or organomegaly noted.  Pain with light palpation to LLQ over bladder.  Bladder dull to percussion.   A:  UA: positive for leukocytes.  P: 1. Send UA for culture     2. Treat empirically with Macrobid 100mg  PO BID x 7 days.     3. Contine to increase fluids to 6 glasses of H20, try cranberr juice watered down, frequent bladder emptying.     4. Asked to please schedule patient for pelvic ultrasound per Dr. Su Hilt notes 2/20.

## 2012-07-01 NOTE — Patient Instructions (Signed)
Urinary Tract Infection Urinary tract infections (UTIs) can develop anywhere along your urinary tract. Your urinary tract is your body's drainage system for removing wastes and extra water. Your urinary tract includes two kidneys, two ureters, a bladder, and a urethra. Your kidneys are a pair of bean-shaped organs. Each kidney is about the size of your fist. They are located below your ribs, one on each side of your spine. CAUSES Infections are caused by microbes, which are microscopic organisms, including fungi, viruses, and bacteria. These organisms are so small that they can only be seen through a microscope. Bacteria are the microbes that most commonly cause UTIs. SYMPTOMS  Symptoms of UTIs may vary by age and gender of the patient and by the location of the infection. Symptoms in young women typically include a frequent and intense urge to urinate and a painful, burning feeling in the bladder or urethra during urination. Older women and men are more likely to be tired, shaky, and weak and have muscle aches and abdominal pain. A fever may mean the infection is in your kidneys. Other symptoms of a kidney infection include pain in your back or sides below the ribs, nausea, and vomiting. DIAGNOSIS To diagnose a UTI, your caregiver will ask you about your symptoms. Your caregiver also will ask to provide a urine sample. The urine sample will be tested for bacteria and white blood cells. White blood cells are made by your body to help fight infection. TREATMENT  Typically, UTIs can be treated with medication. Because most UTIs are caused by a bacterial infection, they usually can be treated with the use of antibiotics. The choice of antibiotic and length of treatment depend on your symptoms and the type of bacteria causing your infection. HOME CARE INSTRUCTIONS  If you were prescribed antibiotics, take them exactly as your caregiver instructs you. Finish the medication even if you feel better after you  have only taken some of the medication.  Drink enough water and fluids to keep your urine clear or pale yellow.  Avoid caffeine, tea, and carbonated beverages. They tend to irritate your bladder.  Empty your bladder often. Avoid holding urine for long periods of time.  Empty your bladder before and after sexual intercourse.  After a bowel movement, women should cleanse from front to back. Use each tissue only once. SEEK MEDICAL CARE IF:   You have back pain.  You develop a fever.  Your symptoms do not begin to resolve within 3 days. SEEK IMMEDIATE MEDICAL CARE IF:   You have severe back pain or lower abdominal pain.  You develop chills.  You have nausea or vomiting.  You have continued burning or discomfort with urination. MAKE SURE YOU:   Understand these instructions.  Will watch your condition.  Will get help right away if you are not doing well or get worse. Document Released: 01/25/2005 Document Revised: 10/17/2011 Document Reviewed: 05/26/2011 [employer] Patient Information 2013 [employer].  

## 2012-07-02 ENCOUNTER — Encounter: Payer: Self-pay | Admitting: Cardiology

## 2012-07-02 ENCOUNTER — Ambulatory Visit (INDEPENDENT_AMBULATORY_CARE_PROVIDER_SITE_OTHER): Payer: Medicare Other | Admitting: Cardiology

## 2012-07-02 VITALS — BP 122/68 | HR 61 | Wt 154.0 lb

## 2012-07-02 DIAGNOSIS — I10 Essential (primary) hypertension: Secondary | ICD-10-CM

## 2012-07-02 NOTE — Patient Instructions (Addendum)
The current medical regimen is effective;  continue present plan and medications.  Follow up as needed 

## 2012-07-02 NOTE — Progress Notes (Signed)
HPI The patient presents for evaluation of hypertension and palpitations.  Since I last saw her she has done well.  The patient denies any new symptoms such as chest discomfort, neck or arm discomfort. There has been no new shortness of breath, PND or orthopnea. There have been no reported palpitations, presyncope or syncope.  She is slightly limited by arthritis but is able to do some walking without symptoms.  Allergies  Allergen Reactions  . Aspirin Shortness Of Breath  . Moxifloxacin Shortness Of Breath  . Propofol Anaphylaxis    Stopped breathing  . Hydrochlorothiazide Nausea And Vomiting  . Penicillins Itching  . Sulfonamide Derivatives Other (See Comments)    unknown    Current Outpatient Prescriptions  Medication Sig Dispense Refill  . CALCIUM-MAGNESUIUM-ZINC 333-133-8.3 MG TABS Take 1 tablet by mouth daily.        . Cholecalciferol (VITAMIN D3) 400 UNITS CAPS Take 1 capsule by mouth daily.      . Cyanocobalamin (VITAMIN B-12) 1000 MCG SUBL Place under the tongue.        Marland Kitchen desloratadine (CLARINEX) 5 MG tablet Take 1 tablet (5 mg total) by mouth daily.  90 tablet  3  . fish oil-omega-3 fatty acids 1000 MG capsule Take 1 capsule by mouth daily.       . fluticasone (FLONASE) 50 MCG/ACT nasal spray Place 2 sprays into the nose daily.  50 g  4  . folic acid (FOLVITE) 400 MCG tablet Take 400 mcg by mouth daily.      Marland Kitchen HYDROcodone-acetaminophen (NORCO/VICODIN) 5-325 MG per tablet Take 0.5 tablets by mouth at bedtime as needed for pain.      Marland Kitchen lansoprazole (PREVACID) 30 MG capsule Take 1 capsule (30 mg total) by mouth daily.  30 capsule  0  . metoprolol succinate (TOPROL-XL) 25 MG 24 hr tablet TAKE 1 TABLET DAILY  90 tablet  2  . niacin (NIASPAN) 500 MG CR tablet 500 mg. Take 1/2 in the am and one at bedtime      . nitrofurantoin, macrocrystal-monohydrate, (MACROBID) 100 MG capsule Take 1 capsule (100 mg total) by mouth 2 (two) times daily.  14 capsule  0  . PROAIR HFA 108 (90 BASE)  MCG/ACT inhaler INHALE 2 PUFFS BY MOUTH EVERY 6 HOURS IF NEEDED  25.5 g  0   No current facility-administered medications for this visit.    Past Medical History  Diagnosis Date  . Hypertension   . Allergy   . GERD (gastroesophageal reflux disease)   . Chest pain, unspecified   . Palpitations   . Edema   . Bronchitis, not specified as acute or chronic   . Irritable bowel syndrome   . Other specified disorders of liver   . Hyperlipidemia   . Diabetes mellitus     Past Surgical History  Procedure Laterality Date  . Appendectomy    . Wrist surgery    . Dilation and curettage of uterus      x 3    ROS:  As stated in the HPI and negative for all other systems.  PHYSICAL EXAM BP 122/68  Pulse 61  Wt 154 lb (69.854 kg)  BMI 29.11 kg/m2 GENERAL:  Well appearing HEENT:  Pupils equal round and reactive, fundi not visualized, oral mucosa unremarkable NECK:  No jugular venous distention, waveform within normal limits, carotid upstroke brisk and symmetric, no bruits, no thyromegaly LYMPHATICS:  No cervical, inguinal adenopathy LUNGS:  Clear to auscultation bilaterally BACK:  No CVA  tenderness CHEST:  Unremarkable HEART:  PMI not displaced or sustained,S1 and S2 within normal limits, no S3, no S4, no clicks, no rubs, no murmurs ABD:  Flat, positive bowel sounds normal in frequency in pitch, no bruits, no rebound, no guarding, no midline pulsatile mass, no hepatomegaly, no splenomegaly EXT:  2 plus pulses throughout, no edema, no cyanosis no clubbing SKIN:  No rashes no nodules NEURO:  Cranial nerves II through XII grossly intact, motor grossly intact throughout [facility]:  Cognitively intact, oriented to person place and time'  EKG: Sinus rhythm, rate 61, axis within normal limits, intervals within normal limits, no acute ST-T wave changes.  07/02/2012   ASSESSMENT AND PLAN  PALPITATIONS:  The patient is no longer bothered by these. No further cardiovascular testing is suggested.  She will followup as needed.  HTN:  Her blood pressure is well controlled. She can continue with meds as listed.

## 2012-07-03 DIAGNOSIS — M5030 Other cervical disc degeneration, unspecified cervical region: Secondary | ICD-10-CM | POA: Diagnosis not present

## 2012-07-03 DIAGNOSIS — R262 Difficulty in walking, not elsewhere classified: Secondary | ICD-10-CM | POA: Diagnosis not present

## 2012-07-03 DIAGNOSIS — M5137 Other intervertebral disc degeneration, lumbosacral region: Secondary | ICD-10-CM | POA: Diagnosis not present

## 2012-07-03 DIAGNOSIS — M79609 Pain in unspecified limb: Secondary | ICD-10-CM | POA: Diagnosis not present

## 2012-07-04 ENCOUNTER — Other Ambulatory Visit: Payer: Self-pay | Admitting: Obstetrics and Gynecology

## 2012-07-04 ENCOUNTER — Other Ambulatory Visit: Payer: Self-pay | Admitting: Family Medicine

## 2012-07-04 ENCOUNTER — Telehealth: Payer: Self-pay | Admitting: Family Medicine

## 2012-07-04 LAB — URINE CULTURE: Colony Count: >=100000

## 2012-07-04 NOTE — Telephone Encounter (Signed)
Called patient and let her know she needs to go the MAU for IV medications to treat her UTI.  Unable to make a schedule, pt served based on first come first serve.

## 2012-07-05 ENCOUNTER — Telehealth: Payer: Self-pay

## 2012-07-05 ENCOUNTER — Other Ambulatory Visit: Payer: Self-pay | Admitting: Family Medicine

## 2012-07-05 ENCOUNTER — Other Ambulatory Visit (HOSPITAL_COMMUNITY): Payer: Self-pay | Admitting: Obstetrics and Gynecology

## 2012-07-05 NOTE — Telephone Encounter (Signed)
PC attempted to pt per LC.  Tried to  Notify patient to not try to go the hospital for IV abx.  Pt to wait for weather to clear.  Failed call. No room left on vm to leave msg.  ld

## 2012-07-06 ENCOUNTER — Encounter (HOSPITAL_COMMUNITY): Payer: Self-pay | Admitting: Family

## 2012-07-06 ENCOUNTER — Inpatient Hospital Stay (HOSPITAL_COMMUNITY)
Admission: AD | Admit: 2012-07-06 | Discharge: 2012-07-06 | Disposition: A | Discharge status: Home / Self Care | Payer: Medicare Other | Source: Ambulatory Visit | Attending: Obstetrics and Gynecology | Admitting: Obstetrics and Gynecology

## 2012-07-06 DIAGNOSIS — N390 Urinary tract infection, site not specified: Secondary | ICD-10-CM | POA: Insufficient documentation

## 2012-07-06 MED ORDER — DEXTROSE 5 % IV SOLN
150.0000 mg | Freq: Once | INTRAVENOUS | Status: DC
  Filled 2012-07-06: qty 3.75

## 2012-07-06 MED ORDER — CEPHALEXIN 500 MG PO CAPS
500.0000 mg | ORAL_CAPSULE | Freq: Once | ORAL | Status: DC
  Filled 2012-07-06: qty 1

## 2012-07-06 NOTE — MAU Note (Signed)
Pt presents with complaints of not being able to keep her medication down. States she is being treated for a bladder infection.

## 2012-07-06 NOTE — MAU Provider Note (Signed)
History   CSN: [id number]  Arrival date and time: 07/06/12 1052   Chief Complaint  Patient presents with  . IV Medication  . Emesis   HPI Pt presents to MAU after receiving phone call from office.  Pt states she is uncertain why she was to come here.  Pt seen at [facility] on 06/30/12 with UTI symptoms.  Pt was given prescription for Nitrofuratoin.  Pt initially reported to staff that she was having difficulty with nausea and vomiting after taking Macrobid. However she now states that she took the medication this AM without difficulty.  Urine culture at that time returned with result of Proteus resistant to Nitrofuratoin.  Pt states that she no longer is having dysuria, frequency, or urinary urgency.  She denies any fever or back pain.    Pertinent Gynecological History: Menses: post-menopausal Bleeding: none Contraception: post menopausal status DES exposure: unknown Blood transfusions: none Sexually transmitted diseases: Unknown Previous GYN Procedures: D&C x 3 Last mammogram: Unknown Date:  Last pap: Unknown Date:    Past Medical History  Diagnosis Date  . Hypertension   . Allergy   . GERD (gastroesophageal reflux disease)   . Chest pain, unspecified   . Palpitations   . Edema   . Bronchitis, not specified as acute or chronic   . Irritable bowel syndrome   . Other specified disorders of liver   . Hyperlipidemia   . Diabetes mellitus     Past Surgical History  Procedure Laterality Date  . Appendectomy    . Wrist surgery    . Dilation and curettage of uterus      x 3    Family History  Problem Relation Age of Onset  . Asthma Mother   . Arthritis Mother     rheumatism  . Allergies Sister   . Drug abuse Other   . Allergies Other   . Arthritis Other     History  Substance Use Topics  . Smoking status: Never Smoker   . Smokeless tobacco: Never Used  . Alcohol Use: No    Allergies:  Allergies  Allergen Reactions  . Aspirin Shortness Of Breath  . Moxifloxacin  Shortness Of Breath  . Propofol Anaphylaxis    Stopped breathing  . Hydrochlorothiazide Nausea And Vomiting  . Other   . Penicillins Itching  . Sulfonamide Derivatives Other (See Comments)    unknown    Prescriptions prior to admission  Medication Sig Dispense Refill  . albuterol (PROAIR HFA) 108 (90 BASE) MCG/ACT inhaler Inhale 1 puff into the lungs every 6 (six) hours as needed for wheezing or shortness of breath (or with excersize).      . CALCIUM-MAGNESUIUM-ZINC 333-133-8.3 MG TABS Take 1 tablet by mouth daily.        . Cyanocobalamin (VITAMIN B-12) 1000 MCG SUBL Place under the tongue.        Marland Kitchen desloratadine (CLARINEX) 5 MG tablet Take 1 tablet (5 mg total) by mouth daily.  90 tablet  3  . fish oil-omega-3 fatty acids 1000 MG capsule Take 1 capsule by mouth daily.       . fluticasone (FLONASE) 50 MCG/ACT nasal spray Place 2 sprays into the nose daily.  50 g  4  . folic acid (FOLVITE) 400 MCG tablet Take 400 mcg by mouth daily.      . lansoprazole (PREVACID) 30 MG capsule Take 1 capsule (30 mg total) by mouth daily.  30 capsule  0  . metoprolol succinate (TOPROL-XL) 25 MG 24  hr tablet TAKE 1 TABLET DAILY  90 tablet  2  . niacin (NIASPAN) 500 MG CR tablet Take 500 mg by mouth 2 (two) times daily. Take 1/2 in the am and one at bedtime      . nitrofurantoin, macrocrystal-monohydrate, (MACROBID) 100 MG capsule Take 100 mg by mouth 2 (two) times daily. 7 day supply, not yet completed      . [DISCONTINUED] nitrofurantoin, macrocrystal-monohydrate, (MACROBID) 100 MG capsule Take 1 capsule (100 mg total) by mouth 2 (two) times daily.  14 capsule  0    Review of Systems  Constitutional: Negative.   HENT: Negative.   Eyes: Negative.   Respiratory: Negative.   Gastrointestinal: Positive for heartburn.  Genitourinary: Negative.   Musculoskeletal: Negative.   Skin: Negative.   Neurological: Negative.   Endo/Heme/Allergies: Negative.   Psychiatric/Behavioral: Negative.    Physical Exam    Blood pressure 133/57, pulse 62, temperature 98.1 F (36.7 C), temperature source Oral, resp. rate 18, height 5\' 1"  (1.549 m), weight 156 lb (70.761 kg).  Physical Exam  Constitutional: She is oriented to person, place, and time. She appears well-developed and well-nourished.  HENT:  Head: Normocephalic and atraumatic.  Right Ear: External ear normal.  Left Ear: External ear normal.  Nose: Nose normal.  Eyes: Conjunctivae are normal. Pupils are equal, round, and reactive to light.  Neck: Normal range of motion. Neck supple.  Cardiovascular: Normal rate, regular rhythm and intact distal pulses.   Respiratory: Effort normal and breath sounds normal.  GI: Soft. Bowel sounds are normal. She exhibits no distension. There is no tenderness. There is no rebound and no guarding.  Genitourinary:  Pelvic Deferred  Musculoskeletal: Normal range of motion.  Neg CVAT bilat  Neurological: She is alert and oriented to person, place, and time. She has normal reflexes.  Skin: Skin is warm and dry.  Psychiatric: She has a normal mood and affect. Her behavior is normal.    MAU Course  Procedures Urine culture  Assessment and Plan  UTI by urine culture  The plan of IV Gentamicin followed by po Keflex and observation discussed with the patient.  Pt states she does not want to proceed at present with po Keflex due to her penicillin allergy.  Would consider IV Gentamicin.  Pt very adamant that she will not take Keflex.  Pt also expresses uncertainty about proceeding with IV antibiotics as well.  Pt states she would prefer to be treated at [facility] where she has an appointment on Monday, 07/08/12.   Consult obtained with Dr. Estanislado Pandy. Repeat urine for culture obtained. Patient discharged to home with careful call parameters regarding need for followup. Pt to [facility] in the next week for f/u. Pt to discontinue Nitrofuratoin at the present time due to drug resistance.    Elwyn Lowden O. 07/06/2012, 12:56 PM

## 2012-07-06 NOTE — MAU Note (Signed)
Patient presents to MAU per nurse practitioner to receive IV medication for UTI. Patient denies pain, chills, NV, or fever today.

## 2012-07-06 NOTE — Progress Notes (Signed)
ANTIBIOTIC CONSULT NOTE - INITIAL  Pharmacy Consult for Gentamicin Indication: UTI  Allergies  Allergen Reactions  . Aspirin Shortness Of Breath  . Moxifloxacin Shortness Of Breath  . Propofol Anaphylaxis    Stopped breathing  . Hydrochlorothiazide Nausea And Vomiting  . Other   . Penicillins Itching  . Sulfonamide Derivatives Other (See Comments)    unknown    Patient Measurements: Height: 5\' 1"  (154.9 cm) Weight: 156 lb (70.761 kg) IBW/kg (Calculated) : 47.8 Adjusted Body Weight: 54.7  Vital Signs: Temp: 98.1 F (36.7 C) (03/08 1106) Temp src: Oral (03/08 1106) BP: 133/57 mmHg (03/08 1106) Pulse Rate: 62 (03/08 1106)  Assessment: 79 y.o. female with urine culture (07/01/12) positive for Proteus mirabilis resistance to current outpt treatment with Macrobid. Plan per MD is to give IV Gentamicin per pharmacy in MAU, wait 15 minutes-1/2 hour then give Keflex 500 mg PO (monitor for drug reaction 1/2 hour) then d/c home with Keflex 500 mg PO BID x 3 days if no problems.   Goal of Therapy:  Gentamicin peak 4-6 mg/L and Trough < 1 mg/L  Plan:  Gentamicin 150 mg IV x 1 dose in MAU   Clayburn Pert     PharmD 07/06/2012,11:45 AM

## 2012-07-07 LAB — URINE CULTURE

## 2012-07-10 DIAGNOSIS — R262 Difficulty in walking, not elsewhere classified: Secondary | ICD-10-CM | POA: Diagnosis not present

## 2012-07-10 DIAGNOSIS — M79609 Pain in unspecified limb: Secondary | ICD-10-CM | POA: Diagnosis not present

## 2012-07-10 DIAGNOSIS — M5030 Other cervical disc degeneration, unspecified cervical region: Secondary | ICD-10-CM | POA: Diagnosis not present

## 2012-07-10 DIAGNOSIS — M5137 Other intervertebral disc degeneration, lumbosacral region: Secondary | ICD-10-CM | POA: Diagnosis not present

## 2012-07-16 ENCOUNTER — Other Ambulatory Visit: Payer: Self-pay | Admitting: Obstetrics and Gynecology

## 2012-07-16 ENCOUNTER — Encounter: Payer: Medicare Other | Admitting: Family Medicine

## 2012-07-16 DIAGNOSIS — R82998 Other abnormal findings in urine: Secondary | ICD-10-CM | POA: Diagnosis not present

## 2012-07-16 DIAGNOSIS — Z09 Encounter for follow-up examination after completed treatment for conditions other than malignant neoplasm: Secondary | ICD-10-CM | POA: Diagnosis not present

## 2012-07-17 DIAGNOSIS — M79609 Pain in unspecified limb: Secondary | ICD-10-CM | POA: Diagnosis not present

## 2012-07-17 DIAGNOSIS — R262 Difficulty in walking, not elsewhere classified: Secondary | ICD-10-CM | POA: Diagnosis not present

## 2012-07-17 DIAGNOSIS — M5137 Other intervertebral disc degeneration, lumbosacral region: Secondary | ICD-10-CM | POA: Diagnosis not present

## 2012-07-17 DIAGNOSIS — M5030 Other cervical disc degeneration, unspecified cervical region: Secondary | ICD-10-CM | POA: Diagnosis not present

## 2012-07-18 LAB — URINE CULTURE: Colony Count: 4000

## 2012-07-24 DIAGNOSIS — M79609 Pain in unspecified limb: Secondary | ICD-10-CM | POA: Diagnosis not present

## 2012-07-24 DIAGNOSIS — M5137 Other intervertebral disc degeneration, lumbosacral region: Secondary | ICD-10-CM | POA: Diagnosis not present

## 2012-07-24 DIAGNOSIS — R262 Difficulty in walking, not elsewhere classified: Secondary | ICD-10-CM | POA: Diagnosis not present

## 2012-07-24 DIAGNOSIS — M5030 Other cervical disc degeneration, unspecified cervical region: Secondary | ICD-10-CM | POA: Diagnosis not present

## 2012-08-22 DIAGNOSIS — M76899 Other specified enthesopathies of unspecified lower limb, excluding foot: Secondary | ICD-10-CM | POA: Diagnosis not present

## 2012-08-22 DIAGNOSIS — IMO0001 Reserved for inherently not codable concepts without codable children: Secondary | ICD-10-CM | POA: Diagnosis not present

## 2012-08-22 DIAGNOSIS — R5381 Other malaise: Secondary | ICD-10-CM | POA: Diagnosis not present

## 2012-08-22 DIAGNOSIS — G4700 Insomnia, unspecified: Secondary | ICD-10-CM | POA: Diagnosis not present

## 2012-08-22 DIAGNOSIS — R5383 Other fatigue: Secondary | ICD-10-CM | POA: Diagnosis not present

## 2012-08-23 DIAGNOSIS — M5137 Other intervertebral disc degeneration, lumbosacral region: Secondary | ICD-10-CM | POA: Diagnosis not present

## 2012-08-23 DIAGNOSIS — M79609 Pain in unspecified limb: Secondary | ICD-10-CM | POA: Diagnosis not present

## 2012-08-23 DIAGNOSIS — R262 Difficulty in walking, not elsewhere classified: Secondary | ICD-10-CM | POA: Diagnosis not present

## 2012-08-23 DIAGNOSIS — M5030 Other cervical disc degeneration, unspecified cervical region: Secondary | ICD-10-CM | POA: Diagnosis not present

## 2012-08-29 DIAGNOSIS — N949 Unspecified condition associated with female genital organs and menstrual cycle: Secondary | ICD-10-CM | POA: Diagnosis not present

## 2012-09-06 DIAGNOSIS — M5030 Other cervical disc degeneration, unspecified cervical region: Secondary | ICD-10-CM | POA: Diagnosis not present

## 2012-09-06 DIAGNOSIS — R262 Difficulty in walking, not elsewhere classified: Secondary | ICD-10-CM | POA: Diagnosis not present

## 2012-09-06 DIAGNOSIS — M5137 Other intervertebral disc degeneration, lumbosacral region: Secondary | ICD-10-CM | POA: Diagnosis not present

## 2012-09-06 DIAGNOSIS — M79609 Pain in unspecified limb: Secondary | ICD-10-CM | POA: Diagnosis not present

## 2012-09-10 DIAGNOSIS — M5137 Other intervertebral disc degeneration, lumbosacral region: Secondary | ICD-10-CM | POA: Diagnosis not present

## 2012-09-10 DIAGNOSIS — M79609 Pain in unspecified limb: Secondary | ICD-10-CM | POA: Diagnosis not present

## 2012-09-10 DIAGNOSIS — M5030 Other cervical disc degeneration, unspecified cervical region: Secondary | ICD-10-CM | POA: Diagnosis not present

## 2012-09-10 DIAGNOSIS — R262 Difficulty in walking, not elsewhere classified: Secondary | ICD-10-CM | POA: Diagnosis not present

## 2012-09-17 DIAGNOSIS — M5137 Other intervertebral disc degeneration, lumbosacral region: Secondary | ICD-10-CM | POA: Diagnosis not present

## 2012-09-17 DIAGNOSIS — M79609 Pain in unspecified limb: Secondary | ICD-10-CM | POA: Diagnosis not present

## 2012-09-17 DIAGNOSIS — M5030 Other cervical disc degeneration, unspecified cervical region: Secondary | ICD-10-CM | POA: Diagnosis not present

## 2012-09-17 DIAGNOSIS — R262 Difficulty in walking, not elsewhere classified: Secondary | ICD-10-CM | POA: Diagnosis not present

## 2012-10-07 DIAGNOSIS — M5030 Other cervical disc degeneration, unspecified cervical region: Secondary | ICD-10-CM | POA: Diagnosis not present

## 2012-10-07 DIAGNOSIS — M5137 Other intervertebral disc degeneration, lumbosacral region: Secondary | ICD-10-CM | POA: Diagnosis not present

## 2012-10-07 DIAGNOSIS — M79609 Pain in unspecified limb: Secondary | ICD-10-CM | POA: Diagnosis not present

## 2012-10-07 DIAGNOSIS — R262 Difficulty in walking, not elsewhere classified: Secondary | ICD-10-CM | POA: Diagnosis not present

## 2012-10-15 ENCOUNTER — Other Ambulatory Visit: Payer: Self-pay

## 2012-10-15 DIAGNOSIS — M5030 Other cervical disc degeneration, unspecified cervical region: Secondary | ICD-10-CM | POA: Diagnosis not present

## 2012-10-15 DIAGNOSIS — R262 Difficulty in walking, not elsewhere classified: Secondary | ICD-10-CM | POA: Diagnosis not present

## 2012-10-15 DIAGNOSIS — M5137 Other intervertebral disc degeneration, lumbosacral region: Secondary | ICD-10-CM | POA: Diagnosis not present

## 2012-10-15 DIAGNOSIS — M79609 Pain in unspecified limb: Secondary | ICD-10-CM | POA: Diagnosis not present

## 2012-10-15 MED ORDER — METOPROLOL SUCCINATE ER 25 MG PO TB24: ORAL_TABLET | ORAL | Status: DC

## 2012-10-29 DIAGNOSIS — Z1231 Encounter for screening mammogram for malignant neoplasm of breast: Secondary | ICD-10-CM | POA: Diagnosis not present

## 2012-10-30 ENCOUNTER — Ambulatory Visit (INDEPENDENT_AMBULATORY_CARE_PROVIDER_SITE_OTHER): Payer: Medicare Other | Admitting: Internal Medicine

## 2012-10-30 ENCOUNTER — Encounter: Payer: Self-pay | Admitting: Internal Medicine

## 2012-10-30 ENCOUNTER — Other Ambulatory Visit (INDEPENDENT_AMBULATORY_CARE_PROVIDER_SITE_OTHER): Payer: Medicare Other

## 2012-10-30 VITALS — BP 110/72 | HR 61 | Temp 98.2°F | Resp 16 | Ht 61.0 in | Wt 152.0 lb

## 2012-10-30 DIAGNOSIS — Z9119 Patient's noncompliance with other medical treatment and regimen: Secondary | ICD-10-CM

## 2012-10-30 DIAGNOSIS — E782 Mixed hyperlipidemia: Secondary | ICD-10-CM

## 2012-10-30 DIAGNOSIS — Z9114 Patient's other noncompliance with medication regimen: Secondary | ICD-10-CM | POA: Insufficient documentation

## 2012-10-30 DIAGNOSIS — I10 Essential (primary) hypertension: Secondary | ICD-10-CM

## 2012-10-30 DIAGNOSIS — IMO0001 Reserved for inherently not codable concepts without codable children: Secondary | ICD-10-CM

## 2012-10-30 DIAGNOSIS — Z91199 Patient's noncompliance with other medical treatment and regimen due to unspecified reason: Secondary | ICD-10-CM

## 2012-10-30 LAB — URINALYSIS, ROUTINE W REFLEX MICROSCOPIC
Ketones, ur: NEGATIVE
RBC / HPF: NONE SEEN (ref 0–?)
Specific Gravity, Urine: >=1.03 (ref 1.000–1.030)
Total Protein, Urine: NEGATIVE
Urine Glucose: NEGATIVE
pH: 6 (ref 5.0–8.0)

## 2012-10-30 LAB — CBC WITH DIFFERENTIAL/PLATELET
Basophils Absolute: 0 10*3/uL (ref 0.0–0.1)
Eosinophils Relative: 2.1 % (ref 0.0–5.0)
Lymphocytes Relative: 43.8 % (ref 12.0–46.0)
Lymphs Abs: 1.9 10*3/uL (ref 0.7–4.0)
Monocytes Relative: 9.1 % (ref 3.0–12.0)
Neutrophils Relative %: 44.7 % (ref 43.0–77.0)
Platelets: 174 10*3/uL (ref 150.0–400.0)
RDW: 13.6 % (ref 11.5–14.6)
WBC: 4.3 10*3/uL — ABNORMAL LOW (ref 4.5–10.5)

## 2012-10-30 LAB — COMPREHENSIVE METABOLIC PANEL
AST: 18 U/L (ref 0–37)
Albumin: 3.6 g/dL (ref 3.5–5.2)
Alkaline Phosphatase: 87 U/L (ref 39–117)
Potassium: 4.5 mEq/L (ref 3.5–5.1)
Sodium: 139 mEq/L (ref 135–145)
Total Protein: 7.9 g/dL (ref 6.0–8.3)

## 2012-10-30 LAB — LDL CHOLESTEROL, DIRECT: Direct LDL: 144.4 mg/dL

## 2012-10-30 LAB — LIPID PANEL
Cholesterol: 240 mg/dL — ABNORMAL HIGH (ref 0–200)
Total CHOL/HDL Ratio: 4
Triglycerides: 136 mg/dL (ref 0.0–149.0)

## 2012-10-30 NOTE — Assessment & Plan Note (Signed)
Her BP is well controlled 

## 2012-10-30 NOTE — Assessment & Plan Note (Signed)
I will recheck her A1C and will treat if needed 

## 2012-10-30 NOTE — Progress Notes (Signed)
Subjective:    Patient ID: Brianna Mcdonald, female    DOB: 1933-01-19, 79 y.o.   MRN: [id number]  Hypertension This is a chronic problem. The current episode started more than 1 year ago. The problem has been gradually improving since onset. The problem is controlled. Pertinent negatives include no anxiety, blurred vision, chest pain, headaches, malaise/fatigue, neck pain, orthopnea, palpitations, peripheral edema, PND, shortness of breath or sweats. There are no associated agents to hypertension. Past treatments include beta blockers. The current treatment provides significant improvement. There are no compliance problems.  Identifiable causes of hypertension include sleep apnea.      Review of Systems  Constitutional: Negative.  Negative for fever, chills, malaise/fatigue, diaphoresis, activity change, appetite change, fatigue and unexpected weight change.  HENT: Negative.  Negative for neck pain.   Eyes: Negative.  Negative for blurred vision.  Respiratory: Negative.  Negative for cough, choking, chest tightness, shortness of breath, wheezing and stridor.   Cardiovascular: Negative.  Negative for chest pain, palpitations, orthopnea, leg swelling and PND.  Gastrointestinal: Negative.  Negative for nausea, vomiting, abdominal pain, diarrhea and constipation.  Endocrine: Negative.  Negative for polydipsia, polyphagia and polyuria.  Genitourinary: Negative.   Musculoskeletal: Positive for back pain (this is being treated at [facility]). Negative for myalgias, joint swelling and gait problem.  Skin: Negative.   Allergic/Immunologic: Negative.   Neurological: Negative for dizziness, tremors, weakness, light-headedness and headaches.  Hematological: Negative.  Negative for adenopathy. Does not bruise/bleed easily.  Psychiatric/Behavioral: Negative.        Objective:   Physical Exam  Vitals reviewed. Constitutional: She is oriented to person, place, and time. She appears well-developed and  well-nourished. No distress.  HENT:  Head: Normocephalic and atraumatic.  Mouth/Throat: Oropharynx is clear and moist. No oropharyngeal exudate.  Eyes: Conjunctivae are normal. Right eye exhibits no discharge. Left eye exhibits no discharge. No scleral icterus.  Neck: Normal range of motion. Neck supple. No JVD present. No tracheal deviation present. No thyromegaly present.  Cardiovascular: Normal rate, regular rhythm, normal heart sounds and intact distal pulses.  Exam reveals no gallop and no friction rub.   No murmur heard. Pulmonary/Chest: Effort normal and breath sounds normal. No stridor. No respiratory distress. She has no wheezes. She has no rales. She exhibits no tenderness.  Abdominal: Soft. Bowel sounds are normal. She exhibits no distension and no mass. There is no tenderness. There is no rebound and no guarding.  Musculoskeletal: Normal range of motion. She exhibits no edema and no tenderness.  Lymphadenopathy:    She has no cervical adenopathy.  Neurological: She is oriented to person, place, and time.  Skin: Skin is warm and dry. No rash noted. She is not diaphoretic. No erythema. No pallor.  Psychiatric: She has a normal mood and affect. Her behavior is normal. Judgment and thought content normal.      Lab Results  Component Value Date   WBC 4.4* 12/26/2010   HGB 12.6 12/26/2010   HCT 39.1 12/26/2010   PLT 175.0 12/26/2010   GLUCOSE 147* 02/13/2012   CHOL 191 07/12/2011   TRIG 64.0 07/12/2011   HDL 72.20 07/12/2011   LDLDIRECT 120.6 04/07/2011   LDLCALC 106* 07/12/2011   ALT 19 02/13/2012   AST 24 02/13/2012   NA 139 02/13/2012   K 5.1 02/13/2012   CL 104 02/13/2012   CREATININE 0.8 02/13/2012   BUN 17 02/13/2012   CO2 29 02/13/2012   TSH 1.38 12/26/2010   HGBA1C 6.5 02/13/2012  MICROALBUR 0.8 02/13/2012      Assessment & Plan:

## 2012-10-30 NOTE — Assessment & Plan Note (Signed)
She will not take a statin 

## 2012-10-30 NOTE — Patient Instructions (Signed)
Type 2 Diabetes Mellitus, Adult Type 2 diabetes mellitus, often simply referred to as type 2 diabetes, is a long-lasting (chronic) disease. In type 2 diabetes, the pancreas does not make enough insulin (a hormone), the cells are less responsive to the insulin that is made (insulin resistance), or both. Normally, insulin moves sugars from food into the tissue cells. The tissue cells use the sugars for energy. The lack of insulin or the lack of normal response to insulin causes excess sugars to build up in the blood instead of going into the tissue cells. As a result, high blood sugar (hyperglycemia) develops. The effect of high sugar (glucose) levels can cause many complications. Type 2 diabetes was also previously called adult-onset diabetes but it can occur at any age.  RISK FACTORS  A person is predisposed to developing type 2 diabetes if someone in the family has the disease and also has one or more of the following primary risk factors:  Overweight.  An inactive lifestyle.  A history of consistently eating high-calorie foods. Maintaining a normal weight and regular physical activity can reduce the chance of developing type 2 diabetes. SYMPTOMS  A person with type 2 diabetes may not show symptoms initially. The symptoms of type 2 diabetes appear slowly. The symptoms include:  Increased thirst (polydipsia).  Increased urination (polyuria).  Increased urination during the night (nocturia).  Weight loss. This weight loss may be rapid.  Frequent, recurring infections.  Tiredness (fatigue).  Weakness.  Vision changes, such as blurred vision.  Fruity smell to your breath.  Abdominal pain.  Nausea or vomiting.  Cuts or bruises which are slow to heal.  Tingling or numbness in the hands or feet. DIAGNOSIS Type 2 diabetes is frequently not diagnosed until complications of diabetes are present. Type 2 diabetes is diagnosed when symptoms or complications are present and when blood  glucose levels are increased. Your blood glucose level may be checked by one or more of the following blood tests:  A fasting blood glucose test. You will not be allowed to eat for at least 8 hours before a blood sample is taken.  A random blood glucose test. Your blood glucose is checked at any time of the day regardless of when you ate.  A hemoglobin A1c blood glucose test. A hemoglobin A1c test provides information about blood glucose control over the previous 3 months.  An oral glucose tolerance test (OGTT). Your blood glucose is measured after you have not eaten (fasted) for 2 hours and then after you drink a glucose-containing beverage. TREATMENT   You may need to take insulin or diabetes medicine daily to keep blood glucose levels in the desired range.  You will need to match insulin dosing with exercise and healthy food choices. The treatment goal is to maintain the before meal blood sugar (preprandial glucose) level at 70 130 mg/dL. HOME CARE INSTRUCTIONS   Have your hemoglobin A1c level checked twice a year.  Perform daily blood glucose monitoring as directed by your caregiver.  Monitor urine ketones when you are ill and as directed by your caregiver.  Take your diabetes medicine or insulin as directed by your caregiver to maintain your blood glucose levels in the desired range.  Never run out of diabetes medicine or insulin. It is needed every day.  Adjust insulin based on your intake of carbohydrates. Carbohydrates can raise blood glucose levels but need to be included in your diet. Carbohydrates provide vitamins, minerals, and fiber which are an essential part of   a healthy diet. Carbohydrates are found in fruits, vegetables, whole grains, dairy products, legumes, and foods containing added sugars.    Eat healthy foods. Alternate 3 meals with 3 snacks.  Lose weight if overweight.  Carry a medical alert card or wear your medical alert jewelry.  Carry a 15 gram  carbohydrate snack with you at all times to treat low blood glucose (hypoglycemia). Some examples of 15 gram carbohydrate snacks include:  Glucose tablets, 3 or 4   Glucose gel, 15 gram tube  Raisins, 2 tablespoons (24 grams)  Jelly beans, 6  Animal crackers, 8  Regular pop, 4 ounces (120 mL)  Gummy treats, 9  Recognize hypoglycemia. Hypoglycemia occurs with blood glucose levels of 70 mg/dL and below. The risk for hypoglycemia increases when fasting or skipping meals, during or after intense exercise, and during sleep. Hypoglycemia symptoms can include:  Tremors or shakes.  Decreased ability to concentrate.  Sweating.  Increased heart rate.  Headache.  Dry mouth.  Hunger.  Irritability.  Anxiety.  Restless sleep.  Altered speech or coordination.  Confusion.  Treat hypoglycemia promptly. If you are alert and able to safely swallow, follow the 15:15 rule:  Take 15 20 grams of rapid-acting glucose or carbohydrate. Rapid-acting options include glucose gel, glucose tablets, or 4 ounces (120 mL) of fruit juice, regular soda, or low fat milk.  Check your blood glucose level 15 minutes after taking the glucose.  Take 15 20 grams more of glucose if the repeat blood glucose level is still 70 mg/dL or below.  Eat a meal or snack within 1 hour once blood glucose levels return to normal.    Be alert to polyuria and polydipsia which are early signs of hyperglycemia. An early awareness of hyperglycemia allows for prompt treatment. Treat hyperglycemia as directed by your caregiver.  Engage in at least 150 minutes of moderate-intensity physical activity a week, spread over at least 3 days of the week or as directed by your caregiver. In addition, you should engage in resistance exercise at least 2 times a week or as directed by your caregiver.  Adjust your medicine and food intake as needed if you start a new exercise or sport.  Follow your sick day plan at any time you  are unable to eat or drink as usual.  Avoid tobacco use.  Limit alcohol intake to no more than 1 drink per day for nonpregnant women and 2 drinks per day for men. You should drink alcohol only when you are also eating food. Talk with your caregiver whether alcohol is safe for you. Tell your caregiver if you drink alcohol several times a week.  Follow up with your caregiver regularly.  Schedule an eye exam soon after the diagnosis of type 2 diabetes and then annually.  Perform daily skin and foot care. Examine your skin and feet daily for cuts, bruises, redness, nail problems, bleeding, blisters, or sores. A foot exam by a caregiver should be done annually.  Brush your teeth and gums at least twice a day and floss at least once a day. Follow up with your dentist regularly.  Share your diabetes management plan with your workplace or school.  Stay up-to-date with immunizations.  Learn to manage stress.  Obtain ongoing diabetes education and support as needed.  Participate in, or seek rehabilitation as needed to maintain or improve independence and quality of life. Request a physical or occupational therapy referral if you are having foot or hand numbness or difficulties with grooming,   dressing, eating, or physical activity. SEEK MEDICAL CARE IF:   You are unable to eat food or drink fluids for more than 6 hours.  You have nausea and vomiting for more than 6 hours.  Your blood glucose level is over 240 mg/dL.  There is a change in mental status.  You develop an additional serious illness.  You have diarrhea for more than 6 hours.  You have been sick or have had a fever for a couple of days and are not getting better.  You have pain during any physical activity.  SEEK IMMEDIATE MEDICAL CARE IF:  You have difficulty breathing.  You have moderate to large ketone levels. MAKE SURE YOU:  Understand these instructions.  Will watch your condition.  Will get help right away if  you are not doing well or get worse. Document Released: 04/17/2005 Document Revised: 01/10/2012 Document Reviewed: 11/14/2011 Exit[employer] Patient Information 2014 [employer].  

## 2012-10-30 NOTE — Assessment & Plan Note (Signed)
She is doing well on Niacin and omega 3 I will recheck her FLP today

## 2012-11-04 DIAGNOSIS — R928 Other abnormal and inconclusive findings on diagnostic imaging of breast: Secondary | ICD-10-CM | POA: Diagnosis not present

## 2012-11-13 ENCOUNTER — Encounter: Payer: Self-pay | Admitting: Internal Medicine

## 2012-12-11 ENCOUNTER — Ambulatory Visit: Payer: Medicare Other | Admitting: Endocrinology

## 2012-12-13 ENCOUNTER — Encounter: Payer: Self-pay | Admitting: Endocrinology

## 2012-12-13 ENCOUNTER — Other Ambulatory Visit: Payer: Self-pay

## 2012-12-13 ENCOUNTER — Ambulatory Visit (INDEPENDENT_AMBULATORY_CARE_PROVIDER_SITE_OTHER): Payer: Medicare Other | Admitting: Endocrinology

## 2012-12-13 VITALS — BP 126/70 | HR 74 | Ht 60.0 in | Wt 153.0 lb

## 2012-12-13 DIAGNOSIS — E041 Nontoxic single thyroid nodule: Secondary | ICD-10-CM

## 2012-12-13 MED ORDER — METFORMIN HCL ER 500 MG PO TB24: 500.0000 mg | ORAL_TABLET | Freq: Every day | ORAL | Status: DC

## 2012-12-13 MED ORDER — GLUCOSE BLOOD VI STRP: 1.0000 | ORAL_STRIP | Freq: Every day | Status: DC

## 2012-12-13 NOTE — Progress Notes (Signed)
Subjective:    Patient ID: Brianna Mcdonald, female    DOB: July 13, 1932, 79 y.o.   MRN: [id number]  HPI Pt returns for f/u of type 2 DM (dx'ed 2012; she has mild if any neuropathy of the lower extremities; no known associated complications; she did not tolerate metformin, due to hypoglycemia).  pt states she feels well in general.    Past Medical History  Diagnosis Date  . Hypertension   . Allergy   . GERD (gastroesophageal reflux disease)   . Chest pain, unspecified   . Palpitations   . Edema   . Bronchitis, not specified as acute or chronic   . Irritable bowel syndrome   . Other specified disorders of liver   . Hyperlipidemia   . Diabetes mellitus     Past Surgical History  Procedure Laterality Date  . Appendectomy    . Wrist surgery    . Dilation and curettage of uterus      x 3    History   Social History  . Marital Status: Widowed    Spouse Name: N/A    Number of Children: N/A  . Years of Education: N/A   Occupational History  . retired    Social History Main Topics  . Smoking status: Never Smoker   . Smokeless tobacco: Never Used  . Alcohol Use: No  . Drug Use: No  . Sexual Activity: Not Currently    Birth Control/ Protection: Post-menopausal   Other Topics Concern  . Not on file   Social History Narrative   Adopted 1 daughter   Lives alone   Widowed   retired    Current Outpatient Prescriptions on File Prior to Visit  Medication Sig Dispense Refill  . albuterol (PROAIR HFA) 108 (90 BASE) MCG/ACT inhaler Inhale 1 puff into the lungs every 6 (six) hours as needed for wheezing or shortness of breath (or with excersize).      . CALCIUM-MAGNESUIUM-ZINC 333-133-8.3 MG TABS Take 1 tablet by mouth daily.        . Cyanocobalamin (VITAMIN B-12) 1000 MCG SUBL Place under the tongue.        Marland Kitchen desloratadine (CLARINEX) 5 MG tablet Take 1 tablet (5 mg total) by mouth daily.  90 tablet  3  . fish oil-omega-3 fatty acids 1000 MG capsule Take 1 capsule by mouth  daily.       . fluticasone (FLONASE) 50 MCG/ACT nasal spray Place 2 sprays into the nose daily.  50 g  4  . folic acid (FOLVITE) 400 MCG tablet Take 400 mcg by mouth daily.      . lansoprazole (PREVACID) 30 MG capsule Take 1 capsule (30 mg total) by mouth daily.  30 capsule  0  . metoprolol succinate (TOPROL-XL) 25 MG 24 hr tablet TAKE 1 TABLET DAILY  90 tablet  3  . niacin (NIASPAN) 500 MG CR tablet Take 500 mg by mouth 2 (two) times daily. Take 1/2 in the am and one at bedtime       No current facility-administered medications on file prior to visit.    Allergies  Allergen Reactions  . Aspirin Shortness Of Breath  . Moxifloxacin Shortness Of Breath  . Propofol Anaphylaxis    Stopped breathing  . Hydrochlorothiazide Nausea And Vomiting  . Other   . Penicillins Itching  . Sulfonamide Derivatives Other (See Comments)    unknown    Family History  Problem Relation Age of Onset  . Asthma Mother   .  Arthritis Mother     rheumatism  . Allergies Sister   . Drug abuse Other   . Allergies Other   . Arthritis Other    BP 126/70  Pulse 74  Ht 5' (1.524 m)  Wt 153 lb (69.4 kg)  BMI 29.88 kg/m2  SpO2 98%  Review of Systems Denies weight change and edema.      Objective:   Physical Exam VITAL SIGNS:  See vs page.   GENERAL: no distress.    Lab Results  Component Value Date   HGBA1C 7.5* 10/30/2012      Assessment & Plan:  DM: worse.  We discussed the nine oral agents available for type 2 diabetes.  This regimen gives the best risk-benefit ratio.  Therefore, i encouraged her to re-try the metformin.  She agrees.

## 2012-12-13 NOTE — Patient Instructions (Addendum)
i have sent a prescription to your pharmacy, to try the metformin again.  Please come back for a follow-up appointment in 3 months.   check your blood sugar once a day.  vary the time of day when you check, between before the 3 meals, and at bedtime.  also check if you have symptoms of your blood sugar being too high or too low.  please keep a record of the readings and bring it to your next appointment here.  please call us sooner if your blood sugar goes below 70, or if you have a lot of readings over 200.

## 2012-12-23 ENCOUNTER — Telehealth: Payer: Self-pay | Admitting: Endocrinology

## 2012-12-23 DIAGNOSIS — E041 Nontoxic single thyroid nodule: Secondary | ICD-10-CM

## 2012-12-23 NOTE — Telephone Encounter (Signed)
i ordered

## 2012-12-23 NOTE — Telephone Encounter (Signed)
Was supposed to have thyroid u/s at [facility]. Pt tried to call and sch herself and she was told to get referral. Please call with referral # [phone] / Sherri S.

## 2012-12-23 NOTE — Telephone Encounter (Signed)
Pt states she will do test here in [place]

## 2012-12-23 NOTE — Telephone Encounter (Signed)
i can't order tests at [facility]. Options are to do the [place] here in [place], or to see dr at [facility]

## 2012-12-23 NOTE — Telephone Encounter (Signed)
Pt advised.

## 2012-12-25 ENCOUNTER — Telehealth: Payer: Self-pay | Admitting: Endocrinology

## 2012-12-25 NOTE — Telephone Encounter (Signed)
Called pt back, she stated she has a itchy rash on her neck and that she would take some Benadryl and go ahead with ultrasound appt tomorrow

## 2012-12-26 ENCOUNTER — Ambulatory Visit
Admission: RE | Admit: 2012-12-26 | Discharge: 2012-12-26 | Disposition: A | Discharge status: Home / Self Care | Payer: Medicare Other | Source: Ambulatory Visit | Attending: Endocrinology | Admitting: Endocrinology

## 2012-12-26 DIAGNOSIS — E041 Nontoxic single thyroid nodule: Secondary | ICD-10-CM

## 2012-12-27 ENCOUNTER — Telehealth: Payer: Self-pay | Admitting: Endocrinology

## 2012-12-27 NOTE — Telephone Encounter (Signed)
[place] says you have developed a new nodule, similar to the first.  i have never seen you for this, so i can't comment on it.  Please see dr at [facility].

## 2012-12-27 NOTE — Telephone Encounter (Signed)
Pt advised.

## 2013-01-02 ENCOUNTER — Telehealth: Payer: Self-pay | Admitting: Endocrinology

## 2013-01-02 NOTE — Telephone Encounter (Signed)
Pt called stating that the metformin that she has been taking is making her constipated with cramping, weak, tired, dizzy and unstable when she walks. She stated she has not taken it for the past 2 days. She wants Dr Everardo All to know. Please advise.

## 2013-01-02 NOTE — Telephone Encounter (Signed)
Ok, please try stopping the metformin Please come back for a follow-up appointment in 2 months.

## 2013-01-03 NOTE — Telephone Encounter (Signed)
Pt advised and has an appt on 11/10

## 2013-01-10 DIAGNOSIS — H04129 Dry eye syndrome of unspecified lacrimal gland: Secondary | ICD-10-CM | POA: Diagnosis not present

## 2013-01-10 DIAGNOSIS — H2510 Age-related nuclear cataract, unspecified eye: Secondary | ICD-10-CM | POA: Diagnosis not present

## 2013-01-13 ENCOUNTER — Telehealth: Payer: Self-pay | Admitting: Endocrinology

## 2013-01-13 NOTE — Telephone Encounter (Signed)
Pt advised copy of ultrasound report ready to be picked up

## 2013-01-15 DIAGNOSIS — E042 Nontoxic multinodular goiter: Secondary | ICD-10-CM | POA: Diagnosis not present

## 2013-01-17 ENCOUNTER — Ambulatory Visit (INDEPENDENT_AMBULATORY_CARE_PROVIDER_SITE_OTHER): Payer: Medicare Other

## 2013-01-17 DIAGNOSIS — Z23 Encounter for immunization: Secondary | ICD-10-CM

## 2013-01-23 DIAGNOSIS — M5030 Other cervical disc degeneration, unspecified cervical region: Secondary | ICD-10-CM | POA: Diagnosis not present

## 2013-01-23 DIAGNOSIS — R5381 Other malaise: Secondary | ICD-10-CM | POA: Diagnosis not present

## 2013-01-23 DIAGNOSIS — IMO0001 Reserved for inherently not codable concepts without codable children: Secondary | ICD-10-CM | POA: Diagnosis not present

## 2013-01-23 DIAGNOSIS — G4700 Insomnia, unspecified: Secondary | ICD-10-CM | POA: Diagnosis not present

## 2013-02-13 ENCOUNTER — Telehealth: Payer: Self-pay | Admitting: Endocrinology

## 2013-02-14 NOTE — Telephone Encounter (Signed)
Pt advised.

## 2013-02-14 NOTE — Telephone Encounter (Signed)
Pt states she has an appt Wed. with allergist wanted to know if dm or thyroid could be the cause

## 2013-02-14 NOTE — Telephone Encounter (Signed)
No, these are different.  i am interested to see what the allergist has to day.

## 2013-02-19 DIAGNOSIS — J45909 Unspecified asthma, uncomplicated: Secondary | ICD-10-CM | POA: Diagnosis not present

## 2013-02-24 DIAGNOSIS — J309 Allergic rhinitis, unspecified: Secondary | ICD-10-CM | POA: Diagnosis not present

## 2013-02-24 DIAGNOSIS — L500 Allergic urticaria: Secondary | ICD-10-CM | POA: Diagnosis not present

## 2013-02-24 DIAGNOSIS — T6391XA Toxic effect of contact with unspecified venomous animal, accidental (unintentional), initial encounter: Secondary | ICD-10-CM | POA: Diagnosis not present

## 2013-02-24 DIAGNOSIS — J45909 Unspecified asthma, uncomplicated: Secondary | ICD-10-CM | POA: Diagnosis not present

## 2013-03-04 ENCOUNTER — Ambulatory Visit (INDEPENDENT_AMBULATORY_CARE_PROVIDER_SITE_OTHER): Payer: Medicare Other | Admitting: Internal Medicine

## 2013-03-04 ENCOUNTER — Encounter: Payer: Self-pay | Admitting: Internal Medicine

## 2013-03-04 ENCOUNTER — Other Ambulatory Visit (INDEPENDENT_AMBULATORY_CARE_PROVIDER_SITE_OTHER): Payer: Medicare Other

## 2013-03-04 VITALS — BP 126/64 | HR 72 | Temp 97.9°F | Resp 16 | Wt 149.0 lb

## 2013-03-04 DIAGNOSIS — I10 Essential (primary) hypertension: Secondary | ICD-10-CM

## 2013-03-04 DIAGNOSIS — E785 Hyperlipidemia, unspecified: Secondary | ICD-10-CM | POA: Diagnosis not present

## 2013-03-04 DIAGNOSIS — IMO0001 Reserved for inherently not codable concepts without codable children: Secondary | ICD-10-CM | POA: Diagnosis not present

## 2013-03-04 DIAGNOSIS — Z23 Encounter for immunization: Secondary | ICD-10-CM | POA: Diagnosis not present

## 2013-03-04 LAB — HM DIABETES FOOT EXAM: HM Diabetic Foot Exam: NORMAL

## 2013-03-04 LAB — HEMOGLOBIN A1C: Hgb A1c MFr Bld: 6.9 % — ABNORMAL HIGH (ref 4.6–6.5)

## 2013-03-04 LAB — BASIC METABOLIC PANEL
CO2: 29 mEq/L (ref 19–32)
Chloride: 104 mEq/L (ref 96–112)
Creatinine, Ser: 0.9 mg/dL (ref 0.4–1.2)
Glucose, Bld: 164 mg/dL — ABNORMAL HIGH (ref 70–99)

## 2013-03-04 NOTE — Progress Notes (Signed)
Pre-visit discussion using our clinic review tool. No additional management support is needed unless otherwise documented below in the visit note.  

## 2013-03-04 NOTE — Progress Notes (Signed)
Subjective:    Patient ID: Brianna Mcdonald, female    DOB: 11-24-1932, 79 y.o.   MRN: [id number]  Diabetes She presents for her follow-up diabetic visit. She has type 2 diabetes mellitus. Her disease course has been stable. There are no hypoglycemic associated symptoms. Pertinent negatives for hypoglycemia include no dizziness or tremors. Pertinent negatives for diabetes include no blurred vision, no chest pain, no fatigue, no foot paresthesias, no foot ulcerations, no polydipsia, no polyphagia, no polyuria, no visual change, no weakness and no weight loss. There are no hypoglycemic complications. Symptoms are stable. There are no diabetic complications. Current diabetic treatment includes diet. She is compliant with treatment all of the time. Her weight is stable. She is following a generally healthy diet. Meal planning includes avoidance of concentrated sweets. She has not had a previous visit with a dietician. She participates in exercise intermittently. There is no change in her home blood glucose trend. An ACE inhibitor/angiotensin II receptor blocker is not being taken. She does not see a podiatrist.Eye exam is current.      Review of Systems  Constitutional: Negative.  Negative for fever, chills, weight loss, diaphoresis, appetite change and fatigue.  HENT: Negative.   Eyes: Negative.  Negative for blurred vision.  Respiratory: Negative.  Negative for cough, chest tightness, shortness of breath, wheezing and stridor.   Cardiovascular: Negative.  Negative for chest pain, palpitations and leg swelling.  Gastrointestinal: Negative.  Negative for nausea, abdominal pain, diarrhea, constipation and blood in stool.  Endocrine: Negative.  Negative for polydipsia, polyphagia and polyuria.  Genitourinary: Negative.   Musculoskeletal: Negative.  Negative for arthralgias, back pain, gait problem, joint swelling, myalgias, neck pain and neck stiffness.  Skin: Negative.   Allergic/Immunologic:  Negative.   Neurological: Negative.  Negative for dizziness, tremors, weakness, light-headedness and numbness.  Hematological: Negative.  Negative for adenopathy. Does not bruise/bleed easily.  Psychiatric/Behavioral: Negative.        Objective:   Physical Exam  Vitals reviewed. Constitutional: She is oriented to person, place, and time. She appears well-developed and well-nourished. No distress.  HENT:  Head: Normocephalic and atraumatic.  Mouth/Throat: Oropharynx is clear and moist. No oropharyngeal exudate.  Eyes: Conjunctivae are normal. Right eye exhibits no discharge. Left eye exhibits no discharge. No scleral icterus.  Neck: Normal range of motion. Neck supple. No JVD present. No tracheal deviation present. No thyromegaly present.  Cardiovascular: Normal rate, regular rhythm, normal heart sounds and intact distal pulses.  Exam reveals no gallop and no friction rub.   No murmur heard. Pulmonary/Chest: Effort normal and breath sounds normal. No stridor. No respiratory distress. She has no wheezes. She has no rales. She exhibits no tenderness.  Abdominal: Soft. Bowel sounds are normal. She exhibits no distension and no mass. There is no tenderness. There is no rebound and no guarding.  Musculoskeletal: Normal range of motion. She exhibits no edema and no tenderness.  Lymphadenopathy:    She has no cervical adenopathy.  Neurological: She is oriented to person, place, and time.  Skin: Skin is warm and dry. No rash noted. She is not diaphoretic. No erythema. No pallor.  Psychiatric: She has a normal mood and affect. Her behavior is normal. Judgment and thought content normal.     Lab Results  Component Value Date   WBC 4.3* 10/30/2012   HGB 13.6 10/30/2012   HCT 39.8 10/30/2012   PLT 174.0 10/30/2012   GLUCOSE 140* 10/30/2012   CHOL 240* 10/30/2012   TRIG 136.0 10/30/2012  HDL 68.50 10/30/2012   LDLDIRECT 144.4 10/30/2012   LDLCALC 106* 07/12/2011   ALT 13 10/30/2012   AST 18 10/30/2012   NA  139 10/30/2012   K 4.5 10/30/2012   CL 102 10/30/2012   CREATININE 0.9 10/30/2012   BUN 13 10/30/2012   CO2 25 10/30/2012   TSH 1.63 10/30/2012   HGBA1C 7.5* 10/30/2012   MICROALBUR 0.8 02/13/2012       Assessment & Plan:

## 2013-03-04 NOTE — Assessment & Plan Note (Signed)
Her BP is well controlled 

## 2013-03-04 NOTE — Assessment & Plan Note (Signed)
She will not take a statin 

## 2013-03-04 NOTE — Assessment & Plan Note (Signed)
Her blood sugar is well controlled with lifestyle modifications

## 2013-03-04 NOTE — Patient Instructions (Signed)
Type 2 Diabetes Mellitus, Adult Type 2 diabetes mellitus, often simply referred to as type 2 diabetes, is a long-lasting (chronic) disease. In type 2 diabetes, the pancreas does not make enough insulin (a hormone), the cells are less responsive to the insulin that is made (insulin resistance), or both. Normally, insulin moves sugars from food into the tissue cells. The tissue cells use the sugars for energy. The lack of insulin or the lack of normal response to insulin causes excess sugars to build up in the blood instead of going into the tissue cells. As a result, high blood sugar (hyperglycemia) develops. The effect of high sugar (glucose) levels can cause many complications. Type 2 diabetes was also previously called adult-onset diabetes but it can occur at any age.  RISK FACTORS  A person is predisposed to developing type 2 diabetes if someone in the family has the disease and also has one or more of the following primary risk factors:  Overweight.  An inactive lifestyle.  A history of consistently eating high-calorie foods. Maintaining a normal weight and regular physical activity can reduce the chance of developing type 2 diabetes. SYMPTOMS  A person with type 2 diabetes may not show symptoms initially. The symptoms of type 2 diabetes appear slowly. The symptoms include:  Increased thirst (polydipsia).  Increased urination (polyuria).  Increased urination during the night (nocturia).  Weight loss. This weight loss may be rapid.  Frequent, recurring infections.  Tiredness (fatigue).  Weakness.  Vision changes, such as blurred vision.  Fruity smell to your breath.  Abdominal pain.  Nausea or vomiting.  Cuts or bruises which are slow to heal.  Tingling or numbness in the hands or feet. DIAGNOSIS Type 2 diabetes is frequently not diagnosed until complications of diabetes are present. Type 2 diabetes is diagnosed when symptoms or complications are present and when blood  glucose levels are increased. Your blood glucose level may be checked by one or more of the following blood tests:  A fasting blood glucose test. You will not be allowed to eat for at least 8 hours before a blood sample is taken.  A random blood glucose test. Your blood glucose is checked at any time of the day regardless of when you ate.  A hemoglobin A1c blood glucose test. A hemoglobin A1c test provides information about blood glucose control over the previous 3 months.  An oral glucose tolerance test (OGTT). Your blood glucose is measured after you have not eaten (fasted) for 2 hours and then after you drink a glucose-containing beverage. TREATMENT   You may need to take insulin or diabetes medicine daily to keep blood glucose levels in the desired range.  You will need to match insulin dosing with exercise and healthy food choices. The treatment goal is to maintain the before meal blood sugar (preprandial glucose) level at 70 130 mg/dL. HOME CARE INSTRUCTIONS   Have your hemoglobin A1c level checked twice a year.  Perform daily blood glucose monitoring as directed by your caregiver.  Monitor urine ketones when you are ill and as directed by your caregiver.  Take your diabetes medicine or insulin as directed by your caregiver to maintain your blood glucose levels in the desired range.  Never run out of diabetes medicine or insulin. It is needed every day.  Adjust insulin based on your intake of carbohydrates. Carbohydrates can raise blood glucose levels but need to be included in your diet. Carbohydrates provide vitamins, minerals, and fiber which are an essential part of   a healthy diet. Carbohydrates are found in fruits, vegetables, whole grains, dairy products, legumes, and foods containing added sugars.    Eat healthy foods. Alternate 3 meals with 3 snacks.  Lose weight if overweight.  Carry a medical alert card or wear your medical alert jewelry.  Carry a 15 gram  carbohydrate snack with you at all times to treat low blood glucose (hypoglycemia). Some examples of 15 gram carbohydrate snacks include:  Glucose tablets, 3 or 4   Glucose gel, 15 gram tube  Raisins, 2 tablespoons (24 grams)  Jelly beans, 6  Animal crackers, 8  Regular pop, 4 ounces (120 mL)  Gummy treats, 9  Recognize hypoglycemia. Hypoglycemia occurs with blood glucose levels of 70 mg/dL and below. The risk for hypoglycemia increases when fasting or skipping meals, during or after intense exercise, and during sleep. Hypoglycemia symptoms can include:  Tremors or shakes.  Decreased ability to concentrate.  Sweating.  Increased heart rate.  Headache.  Dry mouth.  Hunger.  Irritability.  Anxiety.  Restless sleep.  Altered speech or coordination.  Confusion.  Treat hypoglycemia promptly. If you are alert and able to safely swallow, follow the 15:15 rule:  Take 15 20 grams of rapid-acting glucose or carbohydrate. Rapid-acting options include glucose gel, glucose tablets, or 4 ounces (120 mL) of fruit juice, regular soda, or low fat milk.  Check your blood glucose level 15 minutes after taking the glucose.  Take 15 20 grams more of glucose if the repeat blood glucose level is still 70 mg/dL or below.  Eat a meal or snack within 1 hour once blood glucose levels return to normal.    Be alert to polyuria and polydipsia which are early signs of hyperglycemia. An early awareness of hyperglycemia allows for prompt treatment. Treat hyperglycemia as directed by your caregiver.  Engage in at least 150 minutes of moderate-intensity physical activity a week, spread over at least 3 days of the week or as directed by your caregiver. In addition, you should engage in resistance exercise at least 2 times a week or as directed by your caregiver.  Adjust your medicine and food intake as needed if you start a new exercise or sport.  Follow your sick day plan at any time you  are unable to eat or drink as usual.  Avoid tobacco use.  Limit alcohol intake to no more than 1 drink per day for nonpregnant women and 2 drinks per day for men. You should drink alcohol only when you are also eating food. Talk with your caregiver whether alcohol is safe for you. Tell your caregiver if you drink alcohol several times a week.  Follow up with your caregiver regularly.  Schedule an eye exam soon after the diagnosis of type 2 diabetes and then annually.  Perform daily skin and foot care. Examine your skin and feet daily for cuts, bruises, redness, nail problems, bleeding, blisters, or sores. A foot exam by a caregiver should be done annually.  Brush your teeth and gums at least twice a day and floss at least once a day. Follow up with your dentist regularly.  Share your diabetes management plan with your workplace or school.  Stay up-to-date with immunizations.  Learn to manage stress.  Obtain ongoing diabetes education and support as needed.  Participate in, or seek rehabilitation as needed to maintain or improve independence and quality of life. Request a physical or occupational therapy referral if you are having foot or hand numbness or difficulties with grooming,   dressing, eating, or physical activity. SEEK MEDICAL CARE IF:   You are unable to eat food or drink fluids for more than 6 hours.  You have nausea and vomiting for more than 6 hours.  Your blood glucose level is over 240 mg/dL.  There is a change in mental status.  You develop an additional serious illness.  You have diarrhea for more than 6 hours.  You have been sick or have had a fever for a couple of days and are not getting better.  You have pain during any physical activity.  SEEK IMMEDIATE MEDICAL CARE IF:  You have difficulty breathing.  You have moderate to large ketone levels. MAKE SURE YOU:  Understand these instructions.  Will watch your condition.  Will get help right away if  you are not doing well or get worse. Document Released: 04/17/2005 Document Revised: 01/10/2012 Document Reviewed: 11/14/2011 Exit[employer] Patient Information 2014 [employer].  

## 2013-03-06 ENCOUNTER — Telehealth: Payer: Self-pay | Admitting: *Deleted

## 2013-03-06 NOTE — Telephone Encounter (Signed)
Pt called requesting lab results. Please advise.  

## 2013-03-06 NOTE — Telephone Encounter (Signed)
Her labs look real good

## 2013-03-07 NOTE — Telephone Encounter (Signed)
Spoke with pt advised of MDs message 

## 2013-03-10 ENCOUNTER — Ambulatory Visit (INDEPENDENT_AMBULATORY_CARE_PROVIDER_SITE_OTHER): Payer: Medicare Other | Admitting: Endocrinology

## 2013-03-10 ENCOUNTER — Encounter: Payer: Self-pay | Admitting: Endocrinology

## 2013-03-10 VITALS — BP 120/80 | HR 63 | Temp 98.6°F | Ht 60.0 in | Wt 148.6 lb

## 2013-03-10 DIAGNOSIS — B351 Tinea unguium: Secondary | ICD-10-CM

## 2013-03-10 DIAGNOSIS — E119 Type 2 diabetes mellitus without complications: Secondary | ICD-10-CM | POA: Diagnosis not present

## 2013-03-10 NOTE — Patient Instructions (Signed)
Please come back for a follow-up appointment in 3 months.   check your blood sugar once a day.  vary the time of day when you check, between before the 3 meals, and at bedtime.  also check if you have symptoms of your blood sugar being too high or too low.  please keep a record of the readings and bring it to your next appointment here.  please call us sooner if your blood sugar goes below 70, or if you have a lot of readings over 200.

## 2013-03-10 NOTE — Progress Notes (Signed)
Subjective:    Patient ID: Brianna Mcdonald, female    DOB: 12-May-1932, 79 y.o.   MRN: [id number]  HPI Pt returns for f/u of type 2 DM (dx'ed 2012; she has mild if any neuropathy of the lower extremities; no known associated complications; she did not tolerate metformin, due to hypoglycemia;).  She re-tried the metformin 3 mos ago.  However, she got moderate cramps throughout the body, and assoc nausea).  She says toenails are too thick for her to cut.   Past Medical History  Diagnosis Date  . Hypertension   . Allergy   . GERD (gastroesophageal reflux disease)   . Chest pain, unspecified   . Palpitations   . Edema   . Bronchitis, not specified as acute or chronic   . Irritable bowel syndrome   . Other specified disorders of liver   . Hyperlipidemia   . Diabetes mellitus     Past Surgical History  Procedure Laterality Date  . Appendectomy    . Wrist surgery    . Dilation and curettage of uterus      x 3    History   Social History  . Marital Status: Widowed    Spouse Name: N/A    Number of Children: N/A  . Years of Education: N/A   Occupational History  . retired    Social History Main Topics  . Smoking status: Never Smoker   . Smokeless tobacco: Never Used  . Alcohol Use: No  . Drug Use: No  . Sexual Activity: Not Currently    Birth Control/ Protection: Post-menopausal   Other Topics Concern  . Not on file   Social History Narrative   Adopted 1 daughter   Lives alone   Widowed   retired    Current Outpatient Prescriptions on File Prior to Visit  Medication Sig Dispense Refill  . albuterol (PROAIR HFA) 108 (90 BASE) MCG/ACT inhaler Inhale 1 puff into the lungs every 6 (six) hours as needed for wheezing or shortness of breath (or with excersize).      . Calcium Carbonate-Vit D-Min (SM CALCIUM/VITAMIN D3) 600-800 MG-UNIT TABS Take by mouth.      Marland Kitchen CALCIUM-MAGNESUIUM-ZINC 333-133-8.3 MG TABS Take 1 tablet by mouth daily.        . Cyanocobalamin (VITAMIN  B-12) 1000 MCG SUBL Place under the tongue.        Marland Kitchen desloratadine (CLARINEX) 5 MG tablet Take 1 tablet (5 mg total) by mouth daily.  90 tablet  3  . fish oil-omega-3 fatty acids 1000 MG capsule Take 1 capsule by mouth daily.       . fluticasone (FLONASE) 50 MCG/ACT nasal spray Place 2 sprays into the nose daily.  50 g  4  . lansoprazole (PREVACID) 30 MG capsule Take 1 capsule (30 mg total) by mouth daily.  30 capsule  0  . metoprolol succinate (TOPROL-XL) 25 MG 24 hr tablet TAKE 1 TABLET DAILY  90 tablet  3  . niacin (NIASPAN) 500 MG CR tablet Take 500 mg by mouth 2 (two) times daily. Take 1/2 in the am and one at bedtime      . triamcinolone cream (KENALOG) 0.1 %        No current facility-administered medications on file prior to visit.    Allergies  Allergen Reactions  . Aspirin Shortness Of Breath  . Moxifloxacin Shortness Of Breath  . Propofol Anaphylaxis    Stopped breathing  . Hydrochlorothiazide Nausea And Vomiting  . Other   .  Penicillins Itching  . Sulfonamide Derivatives Other (See Comments)    unknown   Family History  Problem Relation Age of Onset  . Asthma Mother   . Arthritis Mother     rheumatism  . Allergies Sister   . Drug abuse Other   . Allergies Other   . Arthritis Other    BP 120/80  Pulse 63  Temp(Src) 98.6 F (37 C) (Oral)  Ht 5' (1.524 m)  Wt 148 lb 9.6 oz (67.405 kg)  BMI 29.02 kg/m2  SpO2 98%  Review of Systems She has lost weight, due to her efforts.  Denies edema.      Objective:   Physical Exam VITAL SIGNS:  See vs page GENERAL: no distress  Lab Results  Component Value Date   HGBA1C 6.9* 03/04/2013      Assessment & Plan:  Type 2 DM: Needs increased rx, if it can be done with a regimen that avoids or minimizes hypoglycemia.  She declines medication for now Cramps, new, apparently due to metformin.   Onychomycosis.  She is ref to podiatry.

## 2013-03-14 ENCOUNTER — Ambulatory Visit: Payer: Medicare Other | Admitting: Endocrinology

## 2013-03-26 ENCOUNTER — Encounter: Payer: Self-pay | Admitting: Podiatry

## 2013-03-26 ENCOUNTER — Ambulatory Visit (INDEPENDENT_AMBULATORY_CARE_PROVIDER_SITE_OTHER): Payer: Medicare Other | Admitting: Podiatry

## 2013-03-26 VITALS — BP 138/70 | HR 70 | Resp 12 | Ht 61.0 in | Wt 148.0 lb

## 2013-03-26 DIAGNOSIS — B351 Tinea unguium: Secondary | ICD-10-CM

## 2013-03-26 DIAGNOSIS — M79609 Pain in unspecified limb: Secondary | ICD-10-CM

## 2013-03-26 NOTE — Progress Notes (Signed)
  Subjective:    Patient ID: Brianna Mcdonald, female    DOB: 02-May-1932, 79 y.o.   MRN: [id number]  HPI  Patient presents complaining of painful second right toenail. In addition she complaining of discomfort generally in all her nails. No prior history of podiatric care.   Review of Systems  All other systems reviewed and are negative.       Objective:   Physical Exam  Subjective: Orientated x61 79 year old black female  Vascular: DP is are two over four bilaterally in the PTs are one over four bilaterally.  Neurological: Sensation detained in monofilament wire intact 10 over 10 locations bilaterally. Vibratory sensation intact bilaterally.  Dermatological: Elongated toenails, discolored, deformed i nail x10 with maximum hypertrophy in the second right toenail. Skin texture and appears dry bilaterally.  Musculoskeletal: Bilateral HAV deformities noted       Assessment & Plan:   Assessment: Symptomatic onychomycoses 6 through 10 Satisfactory neurovascular status  Plan: All 10 toenails are debrided without any bleeding. Reappoint when necessary at patient's request with three-month intervals.

## 2013-03-26 NOTE — Progress Notes (Signed)
N-sore, discoloration L-rt foot 2nd toenail D-1 year O-slowly C-worse A-pressure T-trim

## 2013-04-01 ENCOUNTER — Encounter (HOSPITAL_COMMUNITY): Payer: Self-pay | Admitting: Emergency Medicine

## 2013-04-01 ENCOUNTER — Emergency Department (HOSPITAL_COMMUNITY)
Admission: EM | Admit: 2013-04-01 | Discharge: 2013-04-01 | Disposition: A | Discharge status: Home / Self Care | Payer: Medicare Other | Attending: Emergency Medicine | Admitting: Emergency Medicine

## 2013-04-01 DIAGNOSIS — K219 Gastro-esophageal reflux disease without esophagitis: Secondary | ICD-10-CM | POA: Insufficient documentation

## 2013-04-01 DIAGNOSIS — Z79899 Other long term (current) drug therapy: Secondary | ICD-10-CM | POA: Diagnosis not present

## 2013-04-01 DIAGNOSIS — IMO0002 Reserved for concepts with insufficient information to code with codable children: Secondary | ICD-10-CM | POA: Insufficient documentation

## 2013-04-01 DIAGNOSIS — J029 Acute pharyngitis, unspecified: Secondary | ICD-10-CM | POA: Insufficient documentation

## 2013-04-01 DIAGNOSIS — J40 Bronchitis, not specified as acute or chronic: Secondary | ICD-10-CM | POA: Insufficient documentation

## 2013-04-01 DIAGNOSIS — Z880 Allergy status to penicillin: Secondary | ICD-10-CM | POA: Insufficient documentation

## 2013-04-01 DIAGNOSIS — I10 Essential (primary) hypertension: Secondary | ICD-10-CM | POA: Insufficient documentation

## 2013-04-01 DIAGNOSIS — E119 Type 2 diabetes mellitus without complications: Secondary | ICD-10-CM | POA: Insufficient documentation

## 2013-04-01 MED ORDER — HYDROCOD POLST-CHLORPHEN POLST 10-8 MG/5ML PO LQCR: 5.0000 mL | Freq: Two times a day (BID) | ORAL | Status: DC

## 2013-04-01 MED ORDER — HYDROCOD POLST-CHLORPHEN POLST 10-8 MG/5ML PO LQCR
5.0000 mL | Freq: Once | ORAL | Status: AC
  Administered 2013-04-01: 5 mL via ORAL
  Filled 2013-04-01: qty 5

## 2013-04-01 MED ORDER — GUAIFENESIN ER 600 MG PO TB12: 600.0000 mg | ORAL_TABLET | Freq: Two times a day (BID) | ORAL | Status: DC

## 2013-04-01 NOTE — ED Provider Notes (Signed)
CSN: [id number]     Arrival date & time 04/01/13  1818 History   First MD Initiated Contact with Patient 04/01/13 1905     Chief Complaint  Patient presents with  . Cough    flu s/s    HPI  Is resistant to cough for last 5 days. Minimally productive. No fevers. Chest pain shortness of breath. She lost her voice. She is taking guaifenesin and dextromethorphan elevation over-the-counter medicine.  She ran out of it. She had a bottle at the pharmacy. It was a different brand and color.  She didn't feel as though it would work.  She presents here.  Past Medical History  Diagnosis Date  . Hypertension   . Allergy   . GERD (gastroesophageal reflux disease)   . Chest pain, unspecified   . Palpitations   . Edema   . Bronchitis, not specified as acute or chronic   . Irritable bowel syndrome   . Other specified disorders of liver   . Hyperlipidemia   . Diabetes mellitus    Past Surgical History  Procedure Laterality Date  . Appendectomy    . Wrist surgery    . Dilation and curettage of uterus      x 3   Family History  Problem Relation Age of Onset  . Asthma Mother   . Arthritis Mother     rheumatism  . Allergies Sister   . Drug abuse Other   . Allergies Other   . Arthritis Other    History  Substance Use Topics  . Smoking status: Never Smoker   . Smokeless tobacco: Never Used  . Alcohol Use: No   OB History   Grav Para Term Preterm Abortions TAB SAB Ect Mult Living   0 0 0 0 0 0 0 0 0 0      Review of Systems  Constitutional: Negative for fever, chills, diaphoresis, appetite change and fatigue.  HENT: Positive for sore throat and voice change. Negative for mouth sores, sinus pressure and trouble swallowing.   Eyes: Negative for visual disturbance.  Respiratory: Positive for cough. Negative for chest tightness, shortness of breath and wheezing.   Cardiovascular: Negative for chest pain.  Gastrointestinal: Negative for nausea, vomiting, abdominal pain, diarrhea and  abdominal distention.  Endocrine: Negative for polydipsia, polyphagia and polyuria.  Genitourinary: Negative for dysuria, frequency and hematuria.  Musculoskeletal: Negative for gait problem.  Skin: Negative for color change, pallor and rash.  Neurological: Negative for dizziness, syncope, light-headedness and headaches.  Hematological: Does not bruise/bleed easily.  Psychiatric/Behavioral: Negative for behavioral problems and confusion.    Allergies  Aspirin; Moxifloxacin; Propofol; Hydrochlorothiazide; Metformin and related; Other; Penicillins; and Sulfonamide derivatives  Home Medications   Current Outpatient Rx  Name  Route  Sig  Dispense  Refill  . albuterol (PROAIR HFA) 108 (90 BASE) MCG/ACT inhaler   Inhalation   Inhale 1 puff into the lungs every 6 (six) hours as needed for wheezing or shortness of breath (or with excersize).         . CALCIUM-MAGNESUIUM-ZINC 333-133-8.3 MG TABS   Oral   Take 1 tablet by mouth 2 (two) times daily.          . Cyanocobalamin (VITAMIN B-12) 1000 MCG SUBL   Sublingual   Place 1 tablet under the tongue daily.          Marland Kitchen desloratadine (CLARINEX) 5 MG tablet   Oral   Take 1 tablet (5 mg total) by mouth daily.   90  tablet   3   . fish oil-omega-3 fatty acids 1000 MG capsule   Oral   Take 1 capsule by mouth daily.          . fluticasone (FLONASE) 50 MCG/ACT nasal spray   Nasal   Place 2 sprays into the nose daily.   50 g   4   . lansoprazole (PREVACID) 30 MG capsule   Oral   Take 1 capsule (30 mg total) by mouth daily.   30 capsule   0   . metoprolol succinate (TOPROL-XL) 25 MG 24 hr tablet   Oral   Take 25 mg by mouth daily.         . niacin (NIASPAN) 500 MG CR tablet   Oral   Take 250-500 mg by mouth 2 (two) times daily. Take 250 mg in the am and 500 mg at bedtime         . chlorpheniramine-HYDROcodone (TUSSIONEX PENNKINETIC ER) 10-8 MG/5ML LQCR   Oral   Take 5 mLs by mouth every 12 (twelve) hours.   60 mL    0   . guaiFENesin (MUCINEX) 600 MG 12 hr tablet   Oral   Take 1 tablet (600 mg total) by mouth 2 (two) times daily.   30 tablet   0    BP 156/67  Pulse 68  Temp(Src) 98.5 F (36.9 C) (Oral)  SpO2 98% Physical Exam  Constitutional: She is oriented to person, place, and time. She appears well-developed and well-nourished. No distress.  HENT:  Head: Normocephalic.  Course voice. No stridor. Normal pharynx  Eyes: Conjunctivae are normal. Pupils are equal, round, and reactive to light. No scleral icterus.  Neck: Normal range of motion. Neck supple. No thyromegaly present.  Cardiovascular: Normal rate and regular rhythm.  Exam reveals no gallop and no friction rub.   No murmur heard. Pulmonary/Chest: Effort normal and breath sounds normal. No respiratory distress. She has no wheezes. She has no rales.  Normal breath sounds. No wheezing rales rhonchi or prolongation. No focal diminished breath sounds.  Tachypnea. Does not appear dyspneic. In frequent cough during exam.  Abdominal: Soft. Bowel sounds are normal. She exhibits no distension. There is no tenderness. There is no rebound.  Musculoskeletal: Normal range of motion.  Neurological: She is alert and oriented to person, place, and time.  Skin: Skin is warm and dry. No rash noted.  Psychiatric: She has a normal mood and affect. Her behavior is normal.    ED Course  Procedures (including critical care time) Labs Review Labs Reviewed - No data to display Imaging Review No results found.  EKG Interpretation   None       MDM   1. Bronchitis    Benign exam. Normal vital signs. Afebrile and not hypoxemic. She has appointment physician at 11:00 tomorrow morning she plans to keep this. I don't think she needs imaging tonight she has not normal breath sounds physical exam not getting not tachycardic.  The plan will be guaifenesin and Tussionex.    Roney Marion, MD 04/01/13 [phone]

## 2013-04-01 NOTE — ED Notes (Signed)
Per EMS pt c/o flu s/s x1wk, has an appt tomorrow but wanted to feel better before she goes.

## 2013-04-02 ENCOUNTER — Ambulatory Visit (INDEPENDENT_AMBULATORY_CARE_PROVIDER_SITE_OTHER): Payer: Medicare Other | Admitting: Internal Medicine

## 2013-04-02 ENCOUNTER — Encounter: Payer: Self-pay | Admitting: Internal Medicine

## 2013-04-02 ENCOUNTER — Ambulatory Visit (INDEPENDENT_AMBULATORY_CARE_PROVIDER_SITE_OTHER)
Admission: RE | Admit: 2013-04-02 | Discharge: 2013-04-02 | Disposition: A | Discharge status: Home / Self Care | Payer: Medicare Other | Source: Ambulatory Visit | Attending: Internal Medicine | Admitting: Internal Medicine

## 2013-04-02 VITALS — BP 120/72 | HR 60 | Temp 98.5°F | Resp 16 | Ht 61.0 in | Wt 148.0 lb

## 2013-04-02 DIAGNOSIS — R0989 Other specified symptoms and signs involving the circulatory and respiratory systems: Secondary | ICD-10-CM | POA: Diagnosis not present

## 2013-04-02 DIAGNOSIS — R05 Cough: Secondary | ICD-10-CM

## 2013-04-02 DIAGNOSIS — J209 Acute bronchitis, unspecified: Secondary | ICD-10-CM

## 2013-04-02 NOTE — Assessment & Plan Note (Signed)
I think she has a viral URI. She was given meds yesterday when she was seen in the ER, she has not filled those meds yet - I asked her to get those meds filled and take for her symptoms relief. I do not think she needs antibiotics.

## 2013-04-02 NOTE — Assessment & Plan Note (Signed)
I will check a CXR to see if she has pna, mass, edema

## 2013-04-02 NOTE — Progress Notes (Signed)
Subjective:    Patient ID: Brianna Mcdonald, female    DOB: 09/18/1932, 79 y.o.   MRN: [id number]  Cough This is a new problem. The current episode started in the past 7 days. The problem has been gradually improving. The problem occurs every few hours. The cough is non-productive. Associated symptoms include chills and a sore throat. Pertinent negatives include no chest pain, ear congestion, ear pain, fever, headaches, heartburn, hemoptysis, myalgias, nasal congestion, postnasal drip, rash, shortness of breath, sweats, weight loss or wheezing. She has tried OTC cough suppressant for the symptoms. The treatment provided mild relief. There is no history of asthma, bronchiectasis, bronchitis, COPD, emphysema, environmental allergies or pneumonia.      Review of Systems  Constitutional: Positive for chills. Negative for fever, weight loss, diaphoresis, appetite change and fatigue.  HENT: Positive for sore throat and voice change (laryngitis). Negative for ear pain, postnasal drip, sinus pressure, tinnitus and trouble swallowing.   Eyes: Negative.   Respiratory: Positive for cough. Negative for apnea, hemoptysis, choking, chest tightness, shortness of breath, wheezing and stridor.   Cardiovascular: Negative.  Negative for chest pain, palpitations and leg swelling.  Gastrointestinal: Negative.  Negative for heartburn, nausea, vomiting, abdominal pain, diarrhea, constipation and blood in stool.  Endocrine: Negative.   Genitourinary: Negative.   Musculoskeletal: Negative.  Negative for myalgias.  Skin: Negative.  Negative for rash.  Allergic/Immunologic: Negative.  Negative for environmental allergies.  Neurological: Negative.  Negative for headaches.  Hematological: Negative.  Negative for adenopathy. Does not bruise/bleed easily.  Psychiatric/Behavioral: Negative.        Objective:   Physical Exam  Vitals reviewed. Constitutional: She is oriented to person, place, and time. She appears  well-developed and well-nourished.  Non-toxic appearance. She does not have a sickly appearance. She does not appear ill. No distress.  She has a mildly raspy voice  HENT:  Head: Normocephalic and atraumatic.  Mouth/Throat: Oropharynx is clear and moist. No oropharyngeal exudate.  Eyes: Conjunctivae are normal. Right eye exhibits no discharge. Left eye exhibits no discharge. No scleral icterus.  Neck: Normal range of motion. Neck supple. No JVD present. No tracheal deviation present. No thyromegaly present.  Cardiovascular: Normal rate, regular rhythm, normal heart sounds and intact distal pulses.  Exam reveals no gallop and no friction rub.   No murmur heard. Pulmonary/Chest: Effort normal and breath sounds normal. No accessory muscle usage or stridor. Not tachypneic. No respiratory distress. She has no decreased breath sounds. She has no wheezes. She has no rhonchi. She has no rales. She exhibits no tenderness.  Abdominal: Soft. Bowel sounds are normal. She exhibits no distension and no mass. There is no tenderness. There is no rebound and no guarding.  Musculoskeletal: Normal range of motion. She exhibits no edema and no tenderness.  Lymphadenopathy:    She has no cervical adenopathy.  Neurological: She is oriented to person, place, and time.  Skin: Skin is warm and dry. No rash noted. She is not diaphoretic. No erythema. No pallor.     Lab Results  Component Value Date   WBC 4.3* 10/30/2012   HGB 13.6 10/30/2012   HCT 39.8 10/30/2012   PLT 174.0 10/30/2012   GLUCOSE 164* 03/04/2013   CHOL 240* 10/30/2012   TRIG 136.0 10/30/2012   HDL 68.50 10/30/2012   LDLDIRECT 144.4 10/30/2012   LDLCALC 106* 07/12/2011   ALT 13 10/30/2012   AST 18 10/30/2012   NA 138 03/04/2013   K 3.7 03/04/2013   CL  104 03/04/2013   CREATININE 0.9 03/04/2013   BUN 14 03/04/2013   CO2 29 03/04/2013   TSH 1.63 10/30/2012   HGBA1C 6.9* 03/04/2013   MICROALBUR 0.8 02/13/2012       Assessment & Plan:

## 2013-04-02 NOTE — Patient Instructions (Signed)

## 2013-04-11 ENCOUNTER — Other Ambulatory Visit: Payer: Self-pay | Admitting: Internal Medicine

## 2013-05-03 ENCOUNTER — Other Ambulatory Visit: Payer: Self-pay | Admitting: Internal Medicine

## 2013-05-10 ENCOUNTER — Other Ambulatory Visit: Payer: Self-pay | Admitting: Internal Medicine

## 2013-06-10 ENCOUNTER — Encounter: Payer: Self-pay | Admitting: Endocrinology

## 2013-06-10 ENCOUNTER — Ambulatory Visit (INDEPENDENT_AMBULATORY_CARE_PROVIDER_SITE_OTHER): Payer: Medicare Other | Admitting: Endocrinology

## 2013-06-10 VITALS — BP 112/60 | HR 57 | Temp 98.7°F | Ht 61.0 in | Wt 149.0 lb

## 2013-06-10 DIAGNOSIS — E1165 Type 2 diabetes mellitus with hyperglycemia: Principal | ICD-10-CM

## 2013-06-10 DIAGNOSIS — IMO0001 Reserved for inherently not codable concepts without codable children: Secondary | ICD-10-CM | POA: Diagnosis not present

## 2013-06-10 LAB — HEMOGLOBIN A1C
HEMOGLOBIN A1C: 6.5 % — AB (ref <5.7)
Mean Plasma Glucose: 140 mg/dL — ABNORMAL HIGH (ref <117)

## 2013-06-10 NOTE — Patient Instructions (Addendum)
Please come back for a follow-up appointment in 3 months.   blood and urine tests are being requested for you today.  We'll contact you with results. If it has gone up, we can add "[place]." check your blood sugar once a day.  vary the time of day when you check, between before the 3 meals, and at bedtime.  also check if you have symptoms of your blood sugar being too high or too low.  please keep a record of the readings and bring it to your next appointment here.  please call us sooner if your blood sugar goes below 70, or if you have a lot of readings over 200.

## 2013-06-10 NOTE — Progress Notes (Signed)
Subjective:    Patient ID: Brianna Mcdonald, female    DOB: 04-Feb-1933, 80 y.o.   MRN: [id number]  HPI Pt returns for f/u of type 2 DM (dx'ed 2012; she has mild if any neuropathy of the lower extremities; no known associated complications; she did not tolerate metformin, due to hypoglycemia and GI sxs).  no cbg record, but states cbg's are well-controlled. Past Medical History  Diagnosis Date  . Hypertension   . Allergy   . GERD (gastroesophageal reflux disease)   . Chest pain, unspecified   . Palpitations   . Edema   . Bronchitis, not specified as acute or chronic   . Irritable bowel syndrome   . Other specified disorders of liver   . Hyperlipidemia   . Diabetes mellitus     Past Surgical History  Procedure Laterality Date  . Appendectomy    . Wrist surgery    . Dilation and curettage of uterus      x 3    History   Social History  . Marital Status: Widowed    Spouse Name: N/A    Number of Children: N/A  . Years of Education: N/A   Occupational History  . retired    Social History Main Topics  . Smoking status: Never Smoker   . Smokeless tobacco: Never Used  . Alcohol Use: No  . Drug Use: No  . Sexual Activity: Not Currently    Birth Control/ Protection: Post-menopausal   Other Topics Concern  . Not on file   Social History Narrative   Adopted 1 daughter   Lives alone   Widowed   retired    Current Outpatient Prescriptions on File Prior to Visit  Medication Sig Dispense Refill  . albuterol (PROAIR HFA) 108 (90 BASE) MCG/ACT inhaler Inhale 1 puff into the lungs every 6 (six) hours as needed for wheezing or shortness of breath (or with excersize).      . CALCIUM-MAGNESUIUM-ZINC 333-133-8.3 MG TABS Take 1 tablet by mouth 2 (two) times daily.       . chlorpheniramine-HYDROcodone (TUSSIONEX PENNKINETIC ER) 10-8 MG/5ML LQCR Take 5 mLs by mouth every 12 (twelve) hours.  60 mL  0  . Cyanocobalamin (VITAMIN B-12) 1000 MCG SUBL Place 1 tablet under the tongue  daily.       Marland Kitchen. desloratadine (CLARINEX) 5 MG tablet TAKE 1 TABLET DAILY  90 tablet  3  . fish oil-omega-3 fatty acids 1000 MG capsule Take 1 capsule by mouth daily.       . fluticasone (FLONASE) 50 MCG/ACT nasal spray USE TWO SPRAYS IN EACH NOSTRIL EVERY DAY  16 g  5  . guaiFENesin (MUCINEX) 600 MG 12 hr tablet Take 1 tablet (600 mg total) by mouth 2 (two) times daily.  30 tablet  0  . lansoprazole (PREVACID) 30 MG capsule Take 1 capsule (30 mg total) by mouth daily.  30 capsule  0  . lansoprazole (PREVACID) 30 MG capsule TAKE 1 CAPSULE DAILY  90 capsule  3  . metoprolol succinate (TOPROL-XL) 25 MG 24 hr tablet Take 25 mg by mouth daily.      . niacin (NIASPAN) 500 MG CR tablet Take 250-500 mg by mouth 2 (two) times daily. Take 250 mg in the am and 500 mg at bedtime       No current facility-administered medications on file prior to visit.    Allergies  Allergen Reactions  . Aspirin Shortness Of Breath  . Moxifloxacin Shortness Of Breath  .  Propofol Anaphylaxis    Stopped breathing  . Hydrochlorothiazide Nausea And Vomiting  . Metformin And Related     Leg and abdominal cramps  . Other   . Penicillins Itching  . Sulfonamide Derivatives Other (See Comments)    unknown    Family History  Problem Relation Age of Onset  . Asthma Mother   . Arthritis Mother     rheumatism  . Allergies Sister   . Drug abuse Other   . Allergies Other   . Arthritis Other     BP 112/60  Pulse 57  Temp(Src) 98.7 F (37.1 C) (Oral)  Ht 5\' 1"  (1.549 m)  Wt 149 lb (67.586 kg)  BMI 28.17 kg/m2  SpO2 97%  Review of Systems Denies weight change.  She has edema later in the day.    Objective:   Physical Exam VITAL SIGNS:  See vs page GENERAL: no distress   Lab Results  Component Value Date   HGBA1C 6.5* 06/10/2013      Assessment & Plan:  Type 2 DM: She declines medication for now. Edema: this limits med options

## 2013-06-11 LAB — MICROALBUMIN / CREATININE URINE RATIO
CREATININE, URINE: 94.9 mg/dL
Microalb Creat Ratio: 5.3 mg/g (ref 0.0–30.0)
Microalb, Ur: 0.5 mg/dL (ref 0.00–1.89)

## 2013-06-12 DIAGNOSIS — R928 Other abnormal and inconclusive findings on diagnostic imaging of breast: Secondary | ICD-10-CM | POA: Diagnosis not present

## 2013-06-24 DIAGNOSIS — M5030 Other cervical disc degeneration, unspecified cervical region: Secondary | ICD-10-CM | POA: Diagnosis not present

## 2013-06-24 DIAGNOSIS — R5383 Other fatigue: Secondary | ICD-10-CM | POA: Diagnosis not present

## 2013-06-24 DIAGNOSIS — R5381 Other malaise: Secondary | ICD-10-CM | POA: Diagnosis not present

## 2013-06-24 DIAGNOSIS — IMO0001 Reserved for inherently not codable concepts without codable children: Secondary | ICD-10-CM | POA: Diagnosis not present

## 2013-06-24 DIAGNOSIS — M25569 Pain in unspecified knee: Secondary | ICD-10-CM | POA: Diagnosis not present

## 2013-07-02 ENCOUNTER — Other Ambulatory Visit (INDEPENDENT_AMBULATORY_CARE_PROVIDER_SITE_OTHER): Payer: Medicare Other

## 2013-07-02 ENCOUNTER — Encounter: Payer: Self-pay | Admitting: Internal Medicine

## 2013-07-02 ENCOUNTER — Ambulatory Visit (INDEPENDENT_AMBULATORY_CARE_PROVIDER_SITE_OTHER): Payer: Medicare Other | Admitting: Internal Medicine

## 2013-07-02 VITALS — BP 102/58 | HR 61 | Temp 97.6°F | Resp 16 | Ht 61.0 in | Wt 147.0 lb

## 2013-07-02 DIAGNOSIS — IMO0001 Reserved for inherently not codable concepts without codable children: Secondary | ICD-10-CM

## 2013-07-02 DIAGNOSIS — E1165 Type 2 diabetes mellitus with hyperglycemia: Principal | ICD-10-CM

## 2013-07-02 DIAGNOSIS — I10 Essential (primary) hypertension: Secondary | ICD-10-CM

## 2013-07-02 DIAGNOSIS — Z9114 Patient's other noncompliance with medication regimen: Secondary | ICD-10-CM

## 2013-07-02 DIAGNOSIS — E785 Hyperlipidemia, unspecified: Secondary | ICD-10-CM

## 2013-07-02 DIAGNOSIS — E041 Nontoxic single thyroid nodule: Secondary | ICD-10-CM

## 2013-07-02 LAB — LIPID PANEL
Cholesterol: 186 mg/dL (ref 0–200)
HDL: 61.3 mg/dL (ref >39.00)
LDL Cholesterol: 108 mg/dL — ABNORMAL HIGH (ref 0–99)
Total CHOL/HDL Ratio: 3
Triglycerides: 85 mg/dL (ref 0.0–149.0)
VLDL: 17 mg/dL (ref 0.0–40.0)

## 2013-07-02 LAB — COMPREHENSIVE METABOLIC PANEL
ALBUMIN: 3.1 g/dL — AB (ref 3.5–5.2)
ALT: 15 U/L (ref 0–35)
AST: 20 U/L (ref 0–37)
Alkaline Phosphatase: 102 U/L (ref 39–117)
BUN: 11 mg/dL (ref 6–23)
CALCIUM: 9.1 mg/dL (ref 8.4–10.5)
CO2: 29 mEq/L (ref 19–32)
Chloride: 102 mEq/L (ref 96–112)
Creatinine, Ser: 0.8 mg/dL (ref 0.4–1.2)
GFR: 91.67 mL/min (ref >60.00)
Glucose, Bld: 172 mg/dL — ABNORMAL HIGH (ref 70–99)
Potassium: 3.8 mEq/L (ref 3.5–5.1)
SODIUM: 138 meq/L (ref 135–145)
TOTAL PROTEIN: 7.1 g/dL (ref 6.0–8.3)
Total Bilirubin: 0.7 mg/dL (ref 0.3–1.2)

## 2013-07-02 LAB — TSH: TSH: 1.34 u[IU]/mL (ref 0.35–5.50)

## 2013-07-02 NOTE — Progress Notes (Signed)
   Subjective:    Patient ID: Brianna Mcdonald, female    DOB: 06-05-1932, 80 y.o.   MRN: [id number]  Hyperlipidemia This is a chronic problem. The current episode started more than 1 year ago. The problem is resistant. Recent lipid tests were reviewed and are high. Exacerbating diseases include diabetes. She has no history of chronic renal disease, hypothyroidism, liver disease, obesity or nephrotic syndrome. There are no known factors aggravating her hyperlipidemia. Pertinent negatives include no chest pain, focal sensory loss, focal weakness, leg pain, myalgias or shortness of breath. She is currently on no antihyperlipidemic treatment. Compliance problems include psychosocial issues.       Review of Systems  Constitutional: Negative.  Negative for fever, chills, diaphoresis, appetite change and fatigue.  HENT: Positive for sinus pressure and sneezing. Negative for congestion, ear pain, facial swelling, nosebleeds, sore throat, tinnitus, trouble swallowing and voice change.   Eyes: Negative.   Respiratory: Negative.  Negative for cough, choking, chest tightness, shortness of breath, wheezing and stridor.   Cardiovascular: Negative.  Negative for chest pain, palpitations and leg swelling.  Gastrointestinal: Negative.  Negative for nausea, vomiting, abdominal pain, diarrhea and constipation.  Endocrine: Negative.   Genitourinary: Negative.   Musculoskeletal: Negative.  Negative for arthralgias and myalgias.  Skin: Negative.   Allergic/Immunologic: Negative.   Neurological: Negative.  Negative for dizziness, focal weakness and light-headedness.  Hematological: Negative.  Negative for adenopathy. Does not bruise/bleed easily.  Psychiatric/Behavioral: Negative.        Objective:   Physical Exam  Vitals reviewed. Constitutional: She is oriented to person, place, and time. She appears well-developed and well-nourished. No distress.  HENT:  Head: Normocephalic and atraumatic.    Mouth/Throat: Oropharynx is clear and moist. No oropharyngeal exudate.  Eyes: Conjunctivae are normal. Right eye exhibits no discharge. Left eye exhibits no discharge. No scleral icterus.  Neck: Normal range of motion. Neck supple. No JVD present. No tracheal deviation present. No thyromegaly present.  Cardiovascular: Normal rate, regular rhythm, normal heart sounds and intact distal pulses.  Exam reveals no gallop and no friction rub.   No murmur heard. Pulmonary/Chest: Breath sounds normal. No stridor. No respiratory distress. She has no wheezes. She has no rales. She exhibits no tenderness.  Abdominal: Soft. Bowel sounds are normal. She exhibits no distension and no mass. There is no tenderness. There is no rebound and no guarding.  Musculoskeletal: Normal range of motion. She exhibits no edema and no tenderness.  Lymphadenopathy:    She has no cervical adenopathy.  Neurological: She is oriented to person, place, and time.  Skin: Skin is warm and dry. No rash noted. She is not diaphoretic. No erythema. No pallor.     Lab Results  Component Value Date   WBC 4.3* 10/30/2012   HGB 13.6 10/30/2012   HCT 39.8 10/30/2012   PLT 174.0 10/30/2012   GLUCOSE 164* 03/04/2013   CHOL 240* 10/30/2012   TRIG 136.0 10/30/2012   HDL 68.50 10/30/2012   LDLDIRECT 144.4 10/30/2012   LDLCALC 106* 07/12/2011   ALT 13 10/30/2012   AST 18 10/30/2012   NA 138 03/04/2013   K 3.7 03/04/2013   CL 104 03/04/2013   CREATININE 0.9 03/04/2013   BUN 14 03/04/2013   CO2 29 03/04/2013   TSH 1.63 10/30/2012   HGBA1C 6.5* 06/10/2013   MICROALBUR 0.50 06/10/2013       Assessment & Plan:

## 2013-07-02 NOTE — Assessment & Plan Note (Signed)
Her BP is well controlled I will check her lytes and renal function 

## 2013-07-02 NOTE — Progress Notes (Signed)
Pre visit review using our clinic review tool, if applicable. No additional management support is needed unless otherwise documented below in the visit note. 

## 2013-07-02 NOTE — Assessment & Plan Note (Signed)
We discussed again today the need for her to be on a statin for risk reduction She continues to refuse to take a statin Will check her FLP today and advise her of the results

## 2013-07-02 NOTE — Patient Instructions (Signed)

## 2013-07-02 NOTE — Assessment & Plan Note (Signed)
Her last A1C shows very good control of her blood sugar

## 2013-07-04 ENCOUNTER — Telehealth: Payer: Self-pay | Admitting: *Deleted

## 2013-07-04 NOTE — Telephone Encounter (Signed)
Patient phoned requesting test results from 07/02/13. Please advise.  CB# [phone]

## 2013-07-04 NOTE — Telephone Encounter (Signed)
Lipids are better Sugar is high F/u w/Dr Geralyn FlashJones Thx

## 2013-07-07 NOTE — Telephone Encounter (Signed)
Phoned & relayed Md's response to patient and also printed and mailed results.

## 2013-09-06 ENCOUNTER — Other Ambulatory Visit: Payer: Self-pay | Admitting: Cardiology

## 2013-09-09 ENCOUNTER — Ambulatory Visit: Payer: Medicare Other | Admitting: Endocrinology

## 2013-09-16 ENCOUNTER — Ambulatory Visit: Payer: Medicare Other | Admitting: Endocrinology

## 2013-09-19 ENCOUNTER — Ambulatory Visit (INDEPENDENT_AMBULATORY_CARE_PROVIDER_SITE_OTHER): Payer: Medicare Other | Admitting: Endocrinology

## 2013-09-19 ENCOUNTER — Encounter: Payer: Self-pay | Admitting: Endocrinology

## 2013-09-19 VITALS — BP 114/60 | HR 52 | Temp 97.9°F | Ht 61.0 in | Wt 143.0 lb

## 2013-09-19 DIAGNOSIS — E1165 Type 2 diabetes mellitus with hyperglycemia: Principal | ICD-10-CM

## 2013-09-19 DIAGNOSIS — IMO0001 Reserved for inherently not codable concepts without codable children: Secondary | ICD-10-CM | POA: Diagnosis not present

## 2013-09-19 LAB — HEMOGLOBIN A1C: Hgb A1c MFr Bld: 6.6 % — ABNORMAL HIGH (ref 4.6–6.5)

## 2013-09-19 NOTE — Progress Notes (Signed)
Subjective:    Patient ID: Brianna Mcdonald, female    DOB: 1933/04/06, 80 y.o.   MRN: [id number]  HPI Pt returns for f/u of type 2 DM (dx'ed 2012; she has mild if any neuropathy of the lower extremities; no known associated complications; she did not tolerate metformin, due to hypoglycemia and GI sxs; she has not recently been on any medication for this; she has never had GDM or pancreatitis).  no cbg record, but states cbg's are well-controlled.   Past Medical History  Diagnosis Date  . Hypertension   . Allergy   . GERD (gastroesophageal reflux disease)   . Chest pain, unspecified   . Palpitations   . Edema   . Bronchitis, not specified as acute or chronic   . Irritable bowel syndrome   . Other specified disorders of liver   . Hyperlipidemia   . Diabetes mellitus     Past Surgical History  Procedure Laterality Date  . Appendectomy    . Wrist surgery    . Dilation and curettage of uterus      x 3    History   Social History  . Marital Status: Widowed    Spouse Name: N/A    Number of Children: N/A  . Years of Education: N/A   Occupational History  . retired    Social History Main Topics  . Smoking status: Never Smoker   . Smokeless tobacco: Never Used  . Alcohol Use: No  . Drug Use: No  . Sexual Activity: Not Currently    Birth Control/ Protection: Post-menopausal   Other Topics Concern  . Not on file   Social History Narrative   Adopted 1 daughter   Lives alone   Widowed   retired    Current Outpatient Prescriptions on File Prior to Visit  Medication Sig Dispense Refill  . albuterol (PROAIR HFA) 108 (90 BASE) MCG/ACT inhaler Inhale 1 puff into the lungs every 6 (six) hours as needed for wheezing or shortness of breath (or with excersize).      . CALCIUM-MAGNESUIUM-ZINC 333-133-8.3 MG TABS Take 1 tablet by mouth 2 (two) times daily.       . Cyanocobalamin (VITAMIN B-12) 1000 MCG SUBL Place 1 tablet under the tongue daily.       Marland Kitchen desloratadine  (CLARINEX) 5 MG tablet TAKE 1 TABLET DAILY  90 tablet  3  . fish oil-omega-3 fatty acids 1000 MG capsule Take 1 capsule by mouth daily.       . fluticasone (FLONASE) 50 MCG/ACT nasal spray USE TWO SPRAYS IN EACH NOSTRIL EVERY DAY  16 g  5  . guaiFENesin (MUCINEX) 600 MG 12 hr tablet Take 1 tablet (600 mg total) by mouth 2 (two) times daily.  30 tablet  0  . lansoprazole (PREVACID) 30 MG capsule Take 1 capsule (30 mg total) by mouth daily.  30 capsule  0  . metoprolol succinate (TOPROL-XL) 25 MG 24 hr tablet TAKE 1 TABLET DAILY  30 tablet  0  . niacin (NIASPAN) 500 MG CR tablet Take 250-500 mg by mouth 2 (two) times daily. Take 250 mg in the am and 500 mg at bedtime       No current facility-administered medications on file prior to visit.    Allergies  Allergen Reactions  . Aspirin Shortness Of Breath  . Moxifloxacin Shortness Of Breath  . Propofol Anaphylaxis    Stopped breathing  . Hydrochlorothiazide Nausea And Vomiting  . Metformin And Related  Leg and abdominal cramps  . Other   . Penicillins Itching  . Sulfonamide Derivatives Other (See Comments)    unknown    Family History  Problem Relation Age of Onset  . Asthma Mother   . Arthritis Mother     rheumatism  . Allergies Sister   . Drug abuse Other   . Allergies Other   . Arthritis Other     BP 114/60  Pulse 52  Temp(Src) 97.9 F (36.6 C) (Oral)  Ht 5\' 1"  (1.549 m)  Wt 143 lb (64.864 kg)  BMI 27.03 kg/m2  SpO2 97%   Review of Systems She has lost a few lbs.      Objective:   Physical Exam VITAL SIGNS:  See vs page GENERAL: no distress Pulses: dorsalis pedis intact bilat.   Feet: no deformity. normal color and temp.  no edema Skin:  no ulcer on the feet.   Neuro: sensation is intact to touch on the feet   Lab Results  Component Value Date   HGBA1C 6.6* 09/19/2013      Assessment & Plan:  DM: well-controlled  Patient Instructions  Please come back for a follow-up appointment in 4 months.   A  diabetes blood test is being requested for you today.  We'll contact you with results.   If it has gone up, we can add "[place]."  check your blood sugar once a day.  vary the time of day when you check, between before the 3 meals, and at bedtime.  also check if you have symptoms of your blood sugar being too high or too low.  please keep a record of the readings and bring it to your next appointment here.  please call us sooner if your blood sugar goes below 70, or if you have a lot of readings over 200.

## 2013-09-19 NOTE — Patient Instructions (Addendum)
Please come back for a follow-up appointment in 4 months.   A diabetes blood test is being requested for you today.  We'll contact you with results.   If it has gone up, we can add "[place]."  check your blood sugar once a day.  vary the time of day when you check, between before the 3 meals, and at bedtime.  also check if you have symptoms of your blood sugar being too high or too low.  please keep a record of the readings and bring it to your next appointment here.  please call us sooner if your blood sugar goes below 70, or if you have a lot of readings over 200.

## 2013-10-28 DIAGNOSIS — IMO0001 Reserved for inherently not codable concepts without codable children: Secondary | ICD-10-CM | POA: Diagnosis not present

## 2013-10-28 DIAGNOSIS — R5381 Other malaise: Secondary | ICD-10-CM | POA: Diagnosis not present

## 2013-10-28 DIAGNOSIS — M76899 Other specified enthesopathies of unspecified lower limb, excluding foot: Secondary | ICD-10-CM | POA: Diagnosis not present

## 2013-10-28 DIAGNOSIS — R5383 Other fatigue: Secondary | ICD-10-CM | POA: Diagnosis not present

## 2013-10-28 DIAGNOSIS — M25569 Pain in unspecified knee: Secondary | ICD-10-CM | POA: Diagnosis not present

## 2013-10-30 DIAGNOSIS — Z09 Encounter for follow-up examination after completed treatment for conditions other than malignant neoplasm: Secondary | ICD-10-CM | POA: Diagnosis not present

## 2013-10-30 DIAGNOSIS — R928 Other abnormal and inconclusive findings on diagnostic imaging of breast: Secondary | ICD-10-CM | POA: Diagnosis not present

## 2013-11-19 DIAGNOSIS — M549 Dorsalgia, unspecified: Secondary | ICD-10-CM | POA: Diagnosis not present

## 2013-11-28 DIAGNOSIS — M549 Dorsalgia, unspecified: Secondary | ICD-10-CM | POA: Diagnosis not present

## 2013-12-02 DIAGNOSIS — M549 Dorsalgia, unspecified: Secondary | ICD-10-CM | POA: Diagnosis not present

## 2013-12-04 DIAGNOSIS — M549 Dorsalgia, unspecified: Secondary | ICD-10-CM | POA: Diagnosis not present

## 2013-12-08 DIAGNOSIS — M549 Dorsalgia, unspecified: Secondary | ICD-10-CM | POA: Diagnosis not present

## 2013-12-11 DIAGNOSIS — M549 Dorsalgia, unspecified: Secondary | ICD-10-CM | POA: Diagnosis not present

## 2013-12-15 DIAGNOSIS — M549 Dorsalgia, unspecified: Secondary | ICD-10-CM | POA: Diagnosis not present

## 2013-12-19 DIAGNOSIS — M549 Dorsalgia, unspecified: Secondary | ICD-10-CM | POA: Diagnosis not present

## 2013-12-22 DIAGNOSIS — M549 Dorsalgia, unspecified: Secondary | ICD-10-CM | POA: Diagnosis not present

## 2013-12-23 DIAGNOSIS — M549 Dorsalgia, unspecified: Secondary | ICD-10-CM | POA: Diagnosis not present

## 2013-12-29 ENCOUNTER — Telehealth: Payer: Self-pay | Admitting: *Deleted

## 2013-12-29 DIAGNOSIS — M549 Dorsalgia, unspecified: Secondary | ICD-10-CM | POA: Diagnosis not present

## 2013-12-29 NOTE — Telephone Encounter (Signed)
[facility] Triage Call Report Triage Record Num: [id number] Operator: Hyman Bower Patient Name: Brianna Mcdonald Call Date & Time: 12/28/2013 3:50:30PM Patient Phone: [phone] PCP: Sanda Linger Patient Gender: Female PCP Fax : Patient DOB: Dec 02, 1932 [facility] Name[facility] Reason for Call: Caller: Brianna Mcdonald/Patient; PCP: Sanda Linger (Adults only); CB#: [phone]; Call regarding Medication Issue; Medication(s): Toprol. Patient reports she is no longer getting this medication refilled through her mail order pharmacy. Message sent to provider to discuss this with the patient and help her get the medication supply she needs. Protocol(s) Used: Office Note Recommended Outcome per Protocol: Information Noted and Sent to Office Reason for Outcome: Caller information to office Care Advice: ~ 08/

## 2013-12-30 ENCOUNTER — Telehealth: Payer: Self-pay | Admitting: Internal Medicine

## 2013-12-30 MED ORDER — METOPROLOL SUCCINATE ER 25 MG PO TB24: ORAL_TABLET | ORAL | Status: DC

## 2013-12-30 NOTE — Telephone Encounter (Signed)
Patient rides [employer].  Patient states [facility] will be picking her up from [facility] at 12:00 today.  She is requesting a script to be called in before that time.

## 2013-12-30 NOTE — Telephone Encounter (Signed)
Sent refill to [facility]. Tried calling pt no answer & can't leave msg due to vm being full...Raechel Chute

## 2013-12-30 NOTE — Telephone Encounter (Signed)
Patient came by this morning to request a script for toprol to be called in to [facility].  Patient states she is out of this med.  Please see phone note from 8/31 in regards to call a nurse phone note.

## 2014-01-01 ENCOUNTER — Telehealth: Payer: Self-pay | Admitting: *Deleted

## 2014-01-01 NOTE — Telephone Encounter (Signed)
[facility] Triage Call Report Triage Record Num: [id number] Operator: Derrill Center Patient Name: Brianna Mcdonald Call Date & Time: 12/29/2013 7:28:29PM Patient Phone: [phone] PCP: Sanda Linger Patient Gender: Female PCP Fax : Patient DOB: 11-27-1932 [facility] Name[facility] Reason for Call: Caller: Hugh/Patient; PCP: Sanda Linger (Adults only); CB#: [phone]; Call regarding Medication Issue; Medication(s): Toporol XL 25 mg; Pt calling about her Toprol XL states that she usually receives her medication thorugh mail order and has not been able to receive her refill. Has not taken her medication in 2 days. Pt without complaints at this time. Advised per epic pt must be seen prior to Rx being refilled. Pt states that she has transportation that is bringing her to the office tomorrow (12/30/13) but she does not have an appt. pt states that she is unable to change her transportation arrangements. Message sent to discuss medication refill. Protocol(s) Used: Medication Questions - Adult Recommended Outcome per Protocol: Speak with Provider or Pharmacist within 24 hours Reason for Outcome: Older adult with questions about prescribed and/or nonprescribed medications not covered by available resources Care Advice: ~ 12/29/2013 7:59:14PM Page 1 of 1 [email or web address]

## 2014-01-02 DIAGNOSIS — M549 Dorsalgia, unspecified: Secondary | ICD-10-CM | POA: Diagnosis not present

## 2014-01-07 DIAGNOSIS — M549 Dorsalgia, unspecified: Secondary | ICD-10-CM | POA: Diagnosis not present

## 2014-01-13 DIAGNOSIS — M549 Dorsalgia, unspecified: Secondary | ICD-10-CM | POA: Diagnosis not present

## 2014-01-20 ENCOUNTER — Encounter: Payer: Self-pay | Admitting: Endocrinology

## 2014-01-20 ENCOUNTER — Ambulatory Visit (INDEPENDENT_AMBULATORY_CARE_PROVIDER_SITE_OTHER): Payer: Medicare Other | Admitting: Endocrinology

## 2014-01-20 VITALS — BP 122/74 | HR 51 | Temp 98.4°F | Ht 61.0 in | Wt 145.0 lb

## 2014-01-20 DIAGNOSIS — IMO0001 Reserved for inherently not codable concepts without codable children: Secondary | ICD-10-CM | POA: Diagnosis not present

## 2014-01-20 DIAGNOSIS — E1165 Type 2 diabetes mellitus with hyperglycemia: Principal | ICD-10-CM

## 2014-01-20 DIAGNOSIS — Z23 Encounter for immunization: Secondary | ICD-10-CM

## 2014-01-20 LAB — HEMOGLOBIN A1C: Hgb A1c MFr Bld: 6.6 % — ABNORMAL HIGH (ref 4.6–6.5)

## 2014-01-20 NOTE — Patient Instructions (Addendum)
Please come back for a follow-up appointment in 4 months.   A diabetes blood test is being requested for you today.  We'll contact you with results.   If it has gone up, we can add "[place]."  check your blood sugar once a day.  vary the time of day when you check, between before the 3 meals, and at bedtime.  also check if you have symptoms of your blood sugar being too high or too low.  please keep a record of the readings and bring it to your next appointment here.  please call us sooner if your blood sugar goes below 70, or if you have a lot of readings over 200.   You heart rate is slow.  If you have dizziness, please call Dr Yetta Barre.

## 2014-01-20 NOTE — Progress Notes (Signed)
Subjective:    Patient ID: Brianna Mcdonald, female    DOB: [birth year], 80 y.o.   MRN: [id number]  HPI Pt returns for f/u of diabetes mellitus:   DM type: 2 Dx'ed: 2012 Complications: none Therapy: no medication GDM: never DKA: never Severe hypoglycemia: never Pancreatitis: never Other info: a1c has been well-controlled without medication.    Interval history:  pt states she feels well in general.  Denies dizziness.   Past Medical History  Diagnosis Date  . Hypertension   . Allergy   . GERD (gastroesophageal reflux disease)   . Chest pain, unspecified   . Palpitations   . Edema   . Bronchitis, not specified as acute or chronic   . Irritable bowel syndrome   . Other specified disorders of liver   . Hyperlipidemia   . Diabetes mellitus     Past Surgical History  Procedure Laterality Date  . Appendectomy    . Wrist surgery    . Dilation and curettage of uterus      x 3    History   Social History  . Marital Status: Widowed    Spouse Name: N/A    Number of Children: N/A  . Years of Education: N/A   Occupational History  . retired    Social History Main Topics  . Smoking status: Never Smoker   . Smokeless tobacco: Never Used  . Alcohol Use: No  . Drug Use: No  . Sexual Activity: Not Currently    Birth Control/ Protection: Post-menopausal   Other Topics Concern  . Not on file   Social History Narrative   Adopted 1 daughter   Lives alone   Widowed   retired    Current Outpatient Prescriptions on File Prior to Visit  Medication Sig Dispense Refill  . albuterol (PROAIR HFA) 108 (90 BASE) MCG/ACT inhaler Inhale 1 puff into the lungs every 6 (six) hours as needed for wheezing or shortness of breath (or with excersize).      . CALCIUM-MAGNESUIUM-ZINC 333-133-8.3 MG TABS Take 1 tablet by mouth 2 (two) times daily.       . Cyanocobalamin (VITAMIN B-12) 1000 MCG SUBL Place 1 tablet under the tongue daily.       Marland Kitchen desloratadine (CLARINEX) 5 MG tablet  TAKE 1 TABLET DAILY  90 tablet  3  . fish oil-omega-3 fatty acids 1000 MG capsule Take 1 capsule by mouth daily.       . fluticasone (FLONASE) 50 MCG/ACT nasal spray USE TWO SPRAYS IN EACH NOSTRIL EVERY DAY  16 g  5  . guaiFENesin (MUCINEX) 600 MG 12 hr tablet Take 1 tablet (600 mg total) by mouth 2 (two) times daily.  30 tablet  0  . lansoprazole (PREVACID) 30 MG capsule Take 1 capsule (30 mg total) by mouth daily.  30 capsule  0  . metoprolol succinate (TOPROL-XL) 25 MG 24 hr tablet TAKE 1 TABLET DAILY  30 tablet  5  . niacin (NIASPAN) 500 MG CR tablet Take 250-500 mg by mouth 2 (two) times daily. Take 250 mg in the am and 500 mg at bedtime       No current facility-administered medications on file prior to visit.    Allergies  Allergen Reactions  . Aspirin Shortness Of Breath  . Moxifloxacin Shortness Of Breath  . Propofol Anaphylaxis    Stopped breathing  . Hydrochlorothiazide Nausea And Vomiting  . Metformin And Related     Leg and abdominal cramps  .  Other   . Penicillins Itching  . Sulfonamide Derivatives Other (See Comments)    unknown    Family History  Problem Relation Age of Onset  . Asthma Mother   . Arthritis Mother     rheumatism  . Allergies Sister   . Drug abuse Other   . Allergies Other   . Arthritis Other     BP 122/74  Pulse 51  Temp(Src) 98.4 F (36.9 C) (Oral)  Ht  (1.549 m)  Wt 145 lb (65.772 kg)  BMI 27.41 kg/m2  SpO2 97%    Review of Systems She has gained a few lbs. Denies LOC    Objective:   Physical Exam VITAL SIGNS:  See vs page GENERAL: no distress Pulses: dorsalis pedis intact bilat.   Feet: no deformity.  no edema Skin:  no ulcer on the feet.  normal color and temp. Neuro: sensation is intact to touch on the feet   Lab Results  Component Value Date   HGBA1C 6.6* 01/20/2014   i reviewed electrocardiogram     Assessment & Plan:  Bradycardia, new to me, asymptomatic. DM: well-controlled.   Patient is advised the  following: Patient Instructions  Please come back for a follow-up appointment in 4 months.   A diabetes blood test is being requested for you today.  We'll contact you with results.   If it has gone up, we can add "[place]."  check your blood sugar once a day.  vary the time of day when you check, between before the 3 meals, and at bedtime.  also check if you have symptoms of your blood sugar being too high or too low.  please keep a record of the readings and bring it to your next appointment here.  please call us sooner if your blood sugar goes below 70, or if you have a lot of readings over 200.   You heart rate is slow.  If you have dizziness, please call Dr Yetta Barre.

## 2014-01-21 ENCOUNTER — Telehealth: Payer: Self-pay | Admitting: Cardiology

## 2014-01-21 NOTE — Telephone Encounter (Signed)
New problem:    Per pt her Diabetic Dr told her that her heart rate is low and it may be the Toporol.   Pt has questions about her medications and would like a call back.   Pt is uneasy about taking her medications today.  Per pt she will not take her medication until she gets a call.

## 2014-01-22 ENCOUNTER — Ambulatory Visit: Payer: Medicare Other | Admitting: Internal Medicine

## 2014-01-22 ENCOUNTER — Encounter: Payer: Self-pay | Admitting: Internal Medicine

## 2014-01-22 ENCOUNTER — Ambulatory Visit (INDEPENDENT_AMBULATORY_CARE_PROVIDER_SITE_OTHER): Payer: Medicare Other | Admitting: Internal Medicine

## 2014-01-22 VITALS — BP 118/68 | HR 60 | Temp 98.2°F | Resp 16 | Ht 61.0 in | Wt 145.0 lb

## 2014-01-22 DIAGNOSIS — E785 Hyperlipidemia, unspecified: Secondary | ICD-10-CM | POA: Diagnosis not present

## 2014-01-22 DIAGNOSIS — K589 Irritable bowel syndrome without diarrhea: Secondary | ICD-10-CM

## 2014-01-22 DIAGNOSIS — K219 Gastro-esophageal reflux disease without esophagitis: Secondary | ICD-10-CM

## 2014-01-22 DIAGNOSIS — K7689 Other specified diseases of liver: Secondary | ICD-10-CM

## 2014-01-22 DIAGNOSIS — IMO0001 Reserved for inherently not codable concepts without codable children: Secondary | ICD-10-CM

## 2014-01-22 DIAGNOSIS — J309 Allergic rhinitis, unspecified: Secondary | ICD-10-CM

## 2014-01-22 DIAGNOSIS — E1165 Type 2 diabetes mellitus with hyperglycemia: Secondary | ICD-10-CM

## 2014-01-22 DIAGNOSIS — I10 Essential (primary) hypertension: Secondary | ICD-10-CM

## 2014-01-22 MED ORDER — ATORVASTATIN CALCIUM 10 MG PO TABS: 10.0000 mg | ORAL_TABLET | Freq: Every day | ORAL | Status: DC

## 2014-01-22 NOTE — Progress Notes (Signed)
   Subjective:    Patient ID: Brianna Mcdonald, female    DOB: Jan 26, 1933, 80 y.o.   MRN: [id number]  Hypertension This is a chronic problem. The current episode started more than 1 year ago. The problem is unchanged. The problem is controlled. Pertinent negatives include no anxiety, blurred vision, chest pain, headaches, malaise/fatigue, neck pain, orthopnea, palpitations, peripheral edema, PND, shortness of breath or sweats. There are no associated agents to hypertension. Past treatments include beta blockers. The current treatment provides significant improvement. There are no compliance problems.       Review of Systems  Constitutional: Negative.  Negative for fever, chills, malaise/fatigue, diaphoresis, appetite change and fatigue.  HENT: Negative.   Eyes: Negative.  Negative for blurred vision.  Respiratory: Negative.  Negative for apnea, cough, choking, chest tightness, shortness of breath, wheezing and stridor.   Cardiovascular: Negative.  Negative for chest pain, palpitations, orthopnea, leg swelling and PND.  Gastrointestinal: Negative.  Negative for nausea, vomiting, abdominal pain, diarrhea, constipation and blood in stool.  Endocrine: Negative.   Genitourinary: Negative.   Musculoskeletal: Negative.  Negative for arthralgias, back pain and neck pain.  Skin: Negative.  Negative for rash.  Allergic/Immunologic: Negative.   Neurological: Negative for dizziness, tremors, weakness, light-headedness, numbness and headaches.  Hematological: Negative.  Negative for adenopathy. Does not bruise/bleed easily.  Psychiatric/Behavioral: Negative.        Objective:   Physical Exam  Vitals reviewed. Constitutional: She is oriented to person, place, and time. She appears well-developed and well-nourished. No distress.  HENT:  Head: Normocephalic and atraumatic.  Mouth/Throat: Oropharynx is clear and moist. No oropharyngeal exudate.  Eyes: Conjunctivae are normal. Right eye exhibits no  discharge. Left eye exhibits no discharge. No scleral icterus.  Neck: Normal range of motion. Neck supple. No JVD present. No tracheal deviation present. No thyromegaly present.  Cardiovascular: Normal rate, regular rhythm, normal heart sounds and intact distal pulses.  Exam reveals no gallop and no friction rub.   No murmur heard. Pulmonary/Chest: Effort normal and breath sounds normal. No stridor. No respiratory distress. She has no wheezes. She has no rales. She exhibits no tenderness.  Abdominal: Soft. Bowel sounds are normal. She exhibits no distension and no mass. There is no tenderness. There is no rebound and no guarding.  Musculoskeletal: Normal range of motion. She exhibits no edema and no tenderness.  Lymphadenopathy:    She has no cervical adenopathy.  Neurological: She is oriented to person, place, and time.  Skin: Skin is warm and dry. No rash noted. She is not diaphoretic. No erythema. No pallor.  Psychiatric: She has a normal mood and affect. Her behavior is normal. Judgment and thought content normal.     Lab Results  Component Value Date   WBC 4.3* 10/30/2012   HGB 13.6 10/30/2012   HCT 39.8 10/30/2012   PLT 174.0 10/30/2012   GLUCOSE 172* 07/02/2013   CHOL 186 07/02/2013   TRIG 85.0 07/02/2013   HDL 61.30 07/02/2013   LDLDIRECT 144.4 10/30/2012   LDLCALC 108* 07/02/2013   ALT 15 07/02/2013   AST 20 07/02/2013   NA 138 07/02/2013   K 3.8 07/02/2013   CL 102 07/02/2013   CREATININE 0.8 07/02/2013   BUN 11 07/02/2013   CO2 29 07/02/2013   TSH 1.34 07/02/2013   HGBA1C 6.6* 01/20/2014   MICROALBUR 0.50 06/10/2013        Assessment & Plan:

## 2014-01-22 NOTE — Assessment & Plan Note (Signed)
She has mild bradycardia but has no symptoms related to this She does not want to stop the Beta-blocker She will see cardiology next week for further evaluation

## 2014-01-22 NOTE — Progress Notes (Signed)
Pre visit review using our clinic review tool, if applicable. No additional management support is needed unless otherwise documented below in the visit note. 

## 2014-01-22 NOTE — Patient Instructions (Signed)

## 2014-01-22 NOTE — Assessment & Plan Note (Signed)
She agrees to try low dose lipitor

## 2014-01-22 NOTE — Assessment & Plan Note (Signed)
Her blood sugars are well controlled She was referred for an annual eye exam

## 2014-01-26 NOTE — Telephone Encounter (Signed)
Pt. Has appt. With PA simmons on 29th Sept

## 2014-01-27 ENCOUNTER — Ambulatory Visit: Payer: Medicare Other | Admitting: Cardiology

## 2014-01-28 ENCOUNTER — Ambulatory Visit (INDEPENDENT_AMBULATORY_CARE_PROVIDER_SITE_OTHER): Payer: Medicare Other | Admitting: Physician Assistant

## 2014-01-28 ENCOUNTER — Encounter: Payer: Self-pay | Admitting: Physician Assistant

## 2014-01-28 VITALS — BP 112/70 | HR 59 | Ht 61.0 in | Wt 145.0 lb

## 2014-01-28 DIAGNOSIS — I10 Essential (primary) hypertension: Secondary | ICD-10-CM | POA: Diagnosis not present

## 2014-01-28 DIAGNOSIS — R142 Eructation: Secondary | ICD-10-CM

## 2014-01-28 DIAGNOSIS — R141 Gas pain: Secondary | ICD-10-CM | POA: Diagnosis not present

## 2014-01-28 DIAGNOSIS — R143 Flatulence: Secondary | ICD-10-CM | POA: Diagnosis not present

## 2014-01-28 DIAGNOSIS — R079 Chest pain, unspecified: Secondary | ICD-10-CM | POA: Diagnosis not present

## 2014-01-28 NOTE — Assessment & Plan Note (Signed)
BP well controlled. No changes.  

## 2014-01-28 NOTE — Progress Notes (Signed)
Date:  01/28/2014   ID:  Brianna Mcdonald, DOB 11/04/1932, MRN [id number]  PCP:  Sanda Lingerhomas Jones, MD  Primary Cardiologist:  hochrein     History of Present Illness: Brianna Mcdonald is a 80 y.o. female  History of hypertension, diabetes mellitus, palpitations, GERD, hyperlipidemia, IBS. She is seeing Dr. Antoine PocheHochrein in the past for palpitations.  She reports having a a "thump" on the left side of her chest. It occurred one week ago and happened only one time. She does report that she had physical therapy recently and they're making her go through all kinds of stretching exercises which are new to her.  Tylenol seemed to help. Has not reoccurred. no associated symptoms.  Just reported not to after that developed a very sharp upper left quadrant pain which radiated around up into her axilla. She.this was gas and took some Gas-X. This provided relief she's had not had a recurrence since.  The patient currently denies nausea, vomiting, fever, chest pain, shortness of breath, orthopnea, dizziness, PND, cough, congestion, abdominal pain, hematochezia, melena, lower extremity edema, claudication.  Wt Readings from Last 3 Encounters:  01/28/14 145 lb (65.772 kg)  01/22/14 145 lb (65.772 kg)  01/20/14 145 lb (65.772 kg)     Past Medical History  Diagnosis Date  . Hypertension   . Allergy   . GERD (gastroesophageal reflux disease)   . Chest pain, unspecified   . Palpitations   . Edema   . Bronchitis, not specified as acute or chronic   . Irritable bowel syndrome   . Other specified disorders of liver   . Hyperlipidemia   . Diabetes mellitus     Current Outpatient Prescriptions  Medication Sig Dispense Refill  . albuterol (PROAIR HFA) 108 (90 BASE) MCG/ACT inhaler Inhale 1 puff into the lungs every 6 (six) hours as needed for wheezing or shortness of breath (or with excersize).      . CALCIUM-MAGNESUIUM-ZINC 333-133-8.3 MG TABS Take 1 tablet by mouth 2 (two) times daily.       .  Cyanocobalamin (VITAMIN B-12) 1000 MCG SUBL Place 1 tablet under the tongue daily.       Marland Kitchen. desloratadine (CLARINEX) 5 MG tablet TAKE 1 TABLET DAILY  90 tablet  3  . fish oil-omega-3 fatty acids 1000 MG capsule Take 1 capsule by mouth daily.       . fluticasone (FLONASE) 50 MCG/ACT nasal spray USE TWO SPRAYS IN EACH NOSTRIL EVERY DAY  16 g  5  . lansoprazole (PREVACID) 30 MG capsule Take 1 capsule (30 mg total) by mouth daily.  30 capsule  0  . metoprolol succinate (TOPROL-XL) 25 MG 24 hr tablet TAKE 1 TABLET DAILY  30 tablet  5  . niacin (NIASPAN) 500 MG CR tablet Take 250-500 mg by mouth 2 (two) times daily. Take 250 mg in the am and 500 mg at bedtime       No current facility-administered medications for this visit.    Allergies:    Allergies  Allergen Reactions  . Aspirin Shortness Of Breath  . Moxifloxacin Shortness Of Breath  . Propofol Anaphylaxis    Stopped breathing  . Hydrochlorothiazide Nausea And Vomiting  . Metformin And Related     Leg and abdominal cramps  . Other   . Penicillins Itching  . Sulfonamide Derivatives Other (See Comments)    unknown    Social History:  The patient  reports that she has never smoked. She has never used smokeless  tobacco. She reports that she does not drink alcohol or use illicit drugs.   Family history:   Family History  Problem Relation Age of Onset  . Asthma Mother   . Arthritis Mother     rheumatism  . Allergies Sister   . Drug abuse Other   . Allergies Other   . Arthritis Other     ROS:  Please see the history of present illness.  All other systems reviewed and negative.   PHYSICAL EXAM: VS:  BP 112/70  Pulse 59  Ht 5\' 1"  (1.549 m)  Wt 145 lb (65.772 kg)  BMI 27.41 kg/m2 Overweight, well developed, in no acute distress HEENT: Pupils are equal round react to light accommodation extraocular movements are intact.  Neck: no JVDNo cervical lymphadenopathy. Cardiac: Regular rate and rhythm without murmurs rubs or  gallops. Lungs:  clear to auscultation bilaterally, no wheezing, rhonchi or rales Abd: soft, nontender, positive bowel sounds all quadrants, no hepatosplenomegaly Ext: no lower extremity edema.  2+ radial and dorsalis pedis pulses. M/s:  Chest and axillary pain nonreproducible with palpation. There was mild shoulder pain with extension of the shoulder.  There is no reproducible pain with adduction or abduction Skin: warm and dry Neuro:  Grossly normal  EKG: Sinus bradycardia rate 59 beats per minute no acute changes  ASSESSMENT AND PLAN:  Problem List Items Addressed This Visit   Chest pain     She described this as a "thump". It occurred one week ago and happened only one time. She does report that she had physical therapy recently and they're making her go through all kinds of stretching exercises which are new to her.  Tylenol seemed to help. Has not reoccurred. no associated symptoms.    Essential hypertension, benign - Primary     BP well controlled.  No changes.    Relevant Orders      EKG 12-Lead   Gas pain     I think the sharp pain she experienced was indeed gas related.  It improved after taking gas medicine. She's not had a recurrence since.

## 2014-01-28 NOTE — Patient Instructions (Signed)
Your physician wants you to follow-up in: ONE YEAR WITH DR HOCHREIN You will receive a reminder letter in the mail two months in advance. If you don't receive a letter, please call our office to schedule the follow-up appointment.  

## 2014-01-28 NOTE — Assessment & Plan Note (Signed)
I think the sharp pain she experienced was indeed gas related.  It improved after taking gas medicine. She's not had a recurrence since.

## 2014-01-28 NOTE — Assessment & Plan Note (Signed)
She described this as a "thump". It occurred one week ago and happened only one time. She does report that she had physical therapy recently and they're making her go through all kinds of stretching exercises which are new to her.  Tylenol seemed to help. Has not reoccurred. no associated symptoms.

## 2014-01-30 ENCOUNTER — Encounter: Payer: Self-pay | Admitting: Internal Medicine

## 2014-03-30 ENCOUNTER — Telehealth: Payer: Self-pay | Admitting: Internal Medicine

## 2014-03-30 MED ORDER — LANSOPRAZOLE 30 MG PO CPDR: 30.0000 mg | DELAYED_RELEASE_CAPSULE | Freq: Every day | ORAL | Status: DC

## 2014-03-30 NOTE — Telephone Encounter (Signed)
Pt wants Lansoprazole (Prevacid) sent to [facility] (see prev [facility] info). Pt is completely out of meds

## 2014-05-04 ENCOUNTER — Other Ambulatory Visit: Payer: Self-pay | Admitting: Internal Medicine

## 2014-05-13 ENCOUNTER — Telehealth: Payer: Self-pay | Admitting: Internal Medicine

## 2014-05-13 MED ORDER — METOPROLOL SUCCINATE ER 25 MG PO TB24: ORAL_TABLET | ORAL | Status: DC

## 2014-05-13 MED ORDER — LANSOPRAZOLE 30 MG PO CPDR: 30.0000 mg | DELAYED_RELEASE_CAPSULE | Freq: Every day | ORAL | Status: DC

## 2014-05-13 NOTE — Telephone Encounter (Signed)
Patient requesting refill for metoprolol succinate (TOPROL-XL) 25 MG 24 hr tablet and lansoprazole (PREVACID) 30 MG capsule To be called in to [facility] on [address]

## 2014-05-13 NOTE — Telephone Encounter (Signed)
Notified pt refills sent to [facility].../lmb 

## 2014-05-22 ENCOUNTER — Ambulatory Visit: Payer: Medicare Other | Admitting: Endocrinology

## 2014-07-20 ENCOUNTER — Ambulatory Visit: Payer: Medicare Other | Admitting: Podiatry

## 2014-07-20 ENCOUNTER — Encounter: Payer: Self-pay | Admitting: Podiatry

## 2014-07-20 ENCOUNTER — Ambulatory Visit (INDEPENDENT_AMBULATORY_CARE_PROVIDER_SITE_OTHER): Payer: Medicare Other | Admitting: Podiatry

## 2014-07-20 DIAGNOSIS — M79676 Pain in unspecified toe(s): Secondary | ICD-10-CM

## 2014-07-20 DIAGNOSIS — B351 Tinea unguium: Secondary | ICD-10-CM

## 2014-07-20 DIAGNOSIS — B353 Tinea pedis: Secondary | ICD-10-CM

## 2014-07-20 NOTE — Progress Notes (Signed)
Patient ID: Brianna ColtRuby M Mcdonald, female   DOB: 29-Jul-1932, 81 y.o.   MRN: [id number] Subjective: Patient presents today complaining of painful toenails right and left feet and requesting nail debridement. She also complaining of some discomfort in the right foot generally intermittent foot and toe area.  Objective: DP pulses 2/4 bilaterally PT pulses 1/4 bilaterally No calf edema noted bilaterally or calf tenderness bilaterally  Dermatological: Plantar right mid arch is multiple vesicular eruptions without inflammatory base Appears to be a dry blistered on the base of the posterior nail fold area right hallux and second right toe. There is no erythema and edema surrounding the site The toenails are elongated, discolored incurvated and tender to direct palpation  Assessment: Tinea pedis and right hallux and second right toe plantar arch Symptomatic onychomycoses 6-10  Plan: Debridement of toenails 10 without any bleeding Rx over-the-counter Lotrimin cream apply twice a day to the skin rashes on the first and second toes and plantar aspect of right foot twice a day 30 days  Reappoint at three-month intervals or sooner for patient has concern

## 2014-07-20 NOTE — Patient Instructions (Signed)
Ply over-the-counter Lotrimin cream twice a day to the skin rashes on the first and second toes and the bottom of the right foot 30 days Diabetes and Foot Care Diabetes may cause you to have problems because of poor blood supply (circulation) to your feet and legs. This may cause the skin on your feet to become thinner, break easier, and heal more slowly. Your skin may become dry, and the skin may peel and crack. You may also have nerve damage in your legs and feet causing decreased feeling in them. You may not notice minor injuries to your feet that could lead to infections or more serious problems. Taking care of your feet is one of the most important things you can do for yourself.  HOME CARE INSTRUCTIONS  Wear shoes at all times, even in the house. Do not go barefoot. Bare feet are easily injured.  Check your feet daily for blisters, cuts, and redness. If you cannot see the bottom of your feet, use a mirror or ask someone for help.  Wash your feet with warm water (do not use hot water) and mild soap. Then pat your feet and the areas between your toes until they are completely dry. Do not soak your feet as this can dry your skin.  Apply a moisturizing lotion or petroleum jelly (that does not contain alcohol and is unscented) to the skin on your feet and to dry, brittle toenails. Do not apply lotion between your toes.  Trim your toenails straight across. Do not dig under them or around the cuticle. File the edges of your nails with an emery board or nail file.  Do not cut corns or calluses or try to remove them with medicine.  Wear clean socks or stockings every day. Make sure they are not too tight. Do not wear knee-high stockings since they may decrease blood flow to your legs.  Wear shoes that fit properly and have enough cushioning. To break in new shoes, wear them for just a few hours a day. This prevents you from injuring your feet. Always look in your shoes before you put them on to be  sure there are no objects inside.  Do not cross your legs. This may decrease the blood flow to your feet.  If you find a minor scrape, cut, or break in the skin on your feet, keep it and the skin around it clean and dry. These areas may be cleansed with mild soap and water. Do not cleanse the area with peroxide, alcohol, or iodine.  When you remove an adhesive bandage, be sure not to damage the skin around it.  If you have a wound, look at it several times a day to make sure it is healing.  Do not use heating pads or hot water bottles. They may burn your skin. If you have lost feeling in your feet or legs, you may not know it is happening until it is too late.  Make sure your health care provider performs a complete foot exam at least annually or more often if you have foot problems. Report any cuts, sores, or bruises to your health care provider immediately. SEEK MEDICAL CARE IF:   You have an injury that is not healing.  You have cuts or breaks in the skin.  You have an ingrown nail.  You notice redness on your legs or feet.  You feel burning or tingling in your legs or feet.  You have pain or cramps in your legs  and feet.  Your legs or feet are numb.  Your feet always feel cold. SEEK IMMEDIATE MEDICAL CARE IF:   There is increasing redness, swelling, or pain in or around a wound.  There is a red line that goes up your leg.  Pus is coming from a wound.  You develop a fever or as directed by your health care provider.  You notice a bad smell coming from an ulcer or wound. Document Released: 04/14/2000 Document Revised: 12/18/2012 Document Reviewed: 09/24/2012 [facility] Patient Information 2015 [place], [place]. This information is not intended to replace advice given to you by your health care provider. Make sure you discuss any questions you have with your health care provider.

## 2014-07-21 ENCOUNTER — Telehealth: Payer: Self-pay | Admitting: Internal Medicine

## 2014-07-21 MED ORDER — DESLORATADINE 5 MG PO TABS: 5.0000 mg | ORAL_TABLET | Freq: Every day | ORAL | Status: DC

## 2014-07-21 NOTE — Telephone Encounter (Signed)
Pt called in and needs refill on desloratadine (CLARINEX) 5 MG tablet [[id number]][[id number]]  She is going out of town this weekend a [facility] [address] [place]

## 2014-07-21 NOTE — Telephone Encounter (Signed)
RX approved and sent to local pharmacy. Returned call back to phone number provided for patient, per voice prompt voice mail unable to receive messages.

## 2014-10-12 ENCOUNTER — Ambulatory Visit: Payer: Medicare Other | Admitting: Podiatry

## 2014-10-19 ENCOUNTER — Other Ambulatory Visit: Payer: Self-pay | Admitting: Internal Medicine

## 2014-10-21 ENCOUNTER — Ambulatory Visit: Payer: Medicare Other | Admitting: Podiatry

## 2015-02-18 DIAGNOSIS — Z1231 Encounter for screening mammogram for malignant neoplasm of breast: Secondary | ICD-10-CM | POA: Diagnosis not present

## 2015-02-18 LAB — HM MAMMOGRAPHY

## 2015-02-26 DIAGNOSIS — H53453 Other localized visual field defect, bilateral: Secondary | ICD-10-CM | POA: Insufficient documentation

## 2015-02-26 DIAGNOSIS — E1165 Type 2 diabetes mellitus with hyperglycemia: Secondary | ICD-10-CM | POA: Diagnosis not present

## 2015-02-26 DIAGNOSIS — H2513 Age-related nuclear cataract, bilateral: Secondary | ICD-10-CM | POA: Insufficient documentation

## 2015-03-05 LAB — HM DIABETES EYE EXAM

## 2015-03-08 ENCOUNTER — Encounter: Payer: PRIVATE HEALTH INSURANCE | Admitting: Internal Medicine

## 2015-03-09 ENCOUNTER — Encounter: Payer: Self-pay | Admitting: Internal Medicine

## 2015-03-09 ENCOUNTER — Ambulatory Visit (INDEPENDENT_AMBULATORY_CARE_PROVIDER_SITE_OTHER): Payer: Medicare Other | Admitting: Internal Medicine

## 2015-03-09 ENCOUNTER — Other Ambulatory Visit (INDEPENDENT_AMBULATORY_CARE_PROVIDER_SITE_OTHER): Payer: Medicare Other

## 2015-03-09 VITALS — BP 122/68 | HR 60 | Temp 97.9°F | Resp 16 | Ht 61.0 in | Wt 145.0 lb

## 2015-03-09 DIAGNOSIS — I10 Essential (primary) hypertension: Secondary | ICD-10-CM

## 2015-03-09 DIAGNOSIS — E118 Type 2 diabetes mellitus with unspecified complications: Secondary | ICD-10-CM | POA: Diagnosis not present

## 2015-03-09 DIAGNOSIS — Z23 Encounter for immunization: Secondary | ICD-10-CM

## 2015-03-09 DIAGNOSIS — Z0000 Encounter for general adult medical examination without abnormal findings: Secondary | ICD-10-CM

## 2015-03-09 DIAGNOSIS — E785 Hyperlipidemia, unspecified: Secondary | ICD-10-CM | POA: Diagnosis not present

## 2015-03-09 LAB — MICROALBUMIN / CREATININE URINE RATIO
Creatinine,U: 100.4 mg/dL
MICROALB/CREAT RATIO: 0.7 mg/g (ref 0.0–30.0)
Microalb, Ur: <0.7 mg/dL (ref 0.0–1.9)

## 2015-03-09 LAB — COMPREHENSIVE METABOLIC PANEL
ALK PHOS: 68 U/L (ref 39–117)
ALT: 13 U/L (ref 0–35)
AST: 18 U/L (ref 0–37)
Albumin: 4 g/dL (ref 3.5–5.2)
BUN: 16 mg/dL (ref 6–23)
CO2: 30 mEq/L (ref 19–32)
Calcium: 9.8 mg/dL (ref 8.4–10.5)
Chloride: 102 mEq/L (ref 96–112)
Creatinine, Ser: 0.78 mg/dL (ref 0.40–1.20)
GFR: 91.28 mL/min (ref >60.00)
GLUCOSE: 119 mg/dL — AB (ref 70–99)
POTASSIUM: 4.6 meq/L (ref 3.5–5.1)
Sodium: 140 mEq/L (ref 135–145)
TOTAL PROTEIN: 7.6 g/dL (ref 6.0–8.3)
Total Bilirubin: 0.5 mg/dL (ref 0.2–1.2)

## 2015-03-09 LAB — URINALYSIS, ROUTINE W REFLEX MICROSCOPIC
BILIRUBIN URINE: NEGATIVE
Hgb urine dipstick: NEGATIVE
Ketones, ur: NEGATIVE
Leukocytes, UA: NEGATIVE
Nitrite: NEGATIVE
PH: 7 (ref 5.0–8.0)
RBC / HPF: NONE SEEN (ref 0–?)
SPECIFIC GRAVITY, URINE: 1.015 (ref 1.000–1.030)
TOTAL PROTEIN, URINE-UPE24: NEGATIVE
Urine Glucose: NEGATIVE
Urobilinogen, UA: 0.2 (ref 0.0–1.0)

## 2015-03-09 LAB — CBC WITH DIFFERENTIAL/PLATELET
Basophils Absolute: 0 10*3/uL (ref 0.0–0.1)
Basophils Relative: 0.4 % (ref 0.0–3.0)
EOS PCT: 2.1 % (ref 0.0–5.0)
Eosinophils Absolute: 0.1 10*3/uL (ref 0.0–0.7)
HEMATOCRIT: 40.5 % (ref 36.0–46.0)
HEMOGLOBIN: 13.3 g/dL (ref 12.0–15.0)
LYMPHS ABS: 2.5 10*3/uL (ref 0.7–4.0)
Lymphocytes Relative: 49.8 % — ABNORMAL HIGH (ref 12.0–46.0)
MCHC: 33 g/dL (ref 30.0–36.0)
MCV: 97.7 fl (ref 78.0–100.0)
MONOS PCT: 10.1 % (ref 3.0–12.0)
Monocytes Absolute: 0.5 10*3/uL (ref 0.1–1.0)
NEUTROS PCT: 37.6 % — AB (ref 43.0–77.0)
Neutro Abs: 1.9 10*3/uL (ref 1.4–7.7)
Platelets: 173 10*3/uL (ref 150.0–400.0)
RBC: 4.15 Mil/uL (ref 3.87–5.11)
RDW: 13.8 % (ref 11.5–15.5)
WBC: 5.1 10*3/uL (ref 4.0–10.5)

## 2015-03-09 LAB — LIPID PANEL
CHOL/HDL RATIO: 3
Cholesterol: 214 mg/dL — ABNORMAL HIGH (ref 0–200)
HDL: 69 mg/dL (ref >39.00)
LDL Cholesterol: 126 mg/dL — ABNORMAL HIGH (ref 0–99)
NONHDL: 144.77
Triglycerides: 93 mg/dL (ref 0.0–149.0)
VLDL: 18.6 mg/dL (ref 0.0–40.0)

## 2015-03-09 LAB — TSH: TSH: 0.94 u[IU]/mL (ref 0.35–4.50)

## 2015-03-09 LAB — HEMOGLOBIN A1C: Hgb A1c MFr Bld: 6.4 % (ref 4.6–6.5)

## 2015-03-09 NOTE — Progress Notes (Signed)
Pre visit review using our clinic review tool, if applicable. No additional management support is needed unless otherwise documented below in the visit note. 

## 2015-03-09 NOTE — Patient Instructions (Signed)
Preventive Care for Adults, Female A healthy lifestyle and preventive care can promote health and wellness. Preventive health guidelines for women include the following key practices.  A routine yearly physical is a good way to check with your health care provider about your health and preventive screening. It is a chance to share any concerns and updates on your health and to receive a thorough exam.  Visit your dentist for a routine exam and preventive care every 6 months. Brush your teeth twice a day and floss once a day. Good oral hygiene prevents tooth decay and gum disease.  The frequency of eye exams is based on your age, health, family medical history, use of contact lenses, and other factors. Follow your health care provider's recommendations for frequency of eye exams.  Eat a healthy diet. Foods like vegetables, fruits, whole grains, low-fat dairy products, and lean protein foods contain the nutrients you need without too many calories. Decrease your intake of foods high in solid fats, added sugars, and salt. Eat the right amount of calories for you.Get information about a proper diet from your health care provider, if necessary.  Regular physical exercise is one of the most important things you can do for your health. Most adults should get at least 150 minutes of moderate-intensity exercise (any activity that increases your heart rate and causes you to sweat) each week. In addition, most adults need muscle-strengthening exercises on 2 or more days a week.  Maintain a healthy weight. The body mass index (BMI) is a screening tool to identify possible weight problems. It provides an estimate of body fat based on height and weight. Your health care provider can find your BMI and can help you achieve or maintain a healthy weight.For adults 20 years and older:  A BMI below 18.5 is considered underweight.  A BMI of 18.5 to 24.9 is normal.  A BMI of 25 to 29.9 is considered overweight.  A  BMI of 30 and above is considered obese.  Maintain normal blood lipids and cholesterol levels by exercising and minimizing your intake of saturated fat. Eat a balanced diet with plenty of fruit and vegetables. Blood tests for lipids and cholesterol should begin at age 45 and be repeated every 5 years. If your lipid or cholesterol levels are high, you are over 50, or you are at high risk for heart disease, you may need your cholesterol levels checked more frequently.Ongoing high lipid and cholesterol levels should be treated with medicines if diet and exercise are not working.  If you smoke, find out from your health care provider how to quit. If you do not use tobacco, do not start.  Lung cancer screening is recommended for adults aged 45-80 years who are at high risk for developing lung cancer because of a history of smoking. A yearly low-dose CT scan of the lungs is recommended for people who have at least a 30-pack-year history of smoking and are a current smoker or have quit within the past 15 years. A pack year of smoking is smoking an average of 1 pack of cigarettes a day for 1 year (for example: 1 pack a day for 30 years or 2 packs a day for 15 years). Yearly screening should continue until the smoker has stopped smoking for at least 15 years. Yearly screening should be stopped for people who develop a health problem that would prevent them from having lung cancer treatment.  If you are pregnant, do not drink alcohol. If you are  breastfeeding, be very cautious about drinking alcohol. If you are not pregnant and choose to drink alcohol, do not have more than 1 drink per day. One drink is considered to be 12 ounces (355 mL) of beer, 5 ounces (148 mL) of wine, or 1.5 ounces (44 mL) of liquor.  Avoid use of street drugs. Do not share needles with anyone. Ask for help if you need support or instructions about stopping the use of drugs.  High blood pressure causes heart disease and increases the risk  of stroke. Your blood pressure should be checked at least every 1 to 2 years. Ongoing high blood pressure should be treated with medicines if weight loss and exercise do not work.  If you are 55-79 years old, ask your health care provider if you should take aspirin to prevent strokes.  Diabetes screening is done by taking a blood sample to check your blood glucose level after you have not eaten for a certain period of time (fasting). If you are not overweight and you do not have risk factors for diabetes, you should be screened once every 3 years starting at age 45. If you are overweight or obese and you are 40-70 years of age, you should be screened for diabetes every year as part of your cardiovascular risk assessment.  Breast cancer screening is essential preventive care for women. You should practice "breast self-awareness." This means understanding the normal appearance and feel of your breasts and may include breast self-examination. Any changes detected, no matter how small, should be reported to a health care provider. Women in their 20s and 30s should have a clinical breast exam (CBE) by a health care provider as part of a regular health exam every 1 to 3 years. After age 40, women should have a CBE every year. Starting at age 40, women should consider having a mammogram (breast X-ray test) every year. Women who have a family history of breast cancer should talk to their health care provider about genetic screening. Women at a high risk of breast cancer should talk to their health care providers about having an MRI and a mammogram every year.  Breast cancer gene (BRCA)-related cancer risk assessment is recommended for women who have family members with BRCA-related cancers. BRCA-related cancers include breast, ovarian, tubal, and peritoneal cancers. Having family members with these cancers may be associated with an increased risk for harmful changes (mutations) in the breast cancer genes BRCA1 and  BRCA2. Results of the assessment will determine the need for genetic counseling and BRCA1 and BRCA2 testing.  Your health care provider may recommend that you be screened regularly for cancer of the pelvic organs (ovaries, uterus, and vagina). This screening involves a pelvic examination, including checking for microscopic changes to the surface of your cervix (Pap test). You may be encouraged to have this screening done every 3 years, beginning at age 21.  For women ages 30-65, health care providers may recommend pelvic exams and Pap testing every 3 years, or they may recommend the Pap and pelvic exam, combined with testing for human papilloma virus (HPV), every 5 years. Some types of HPV increase your risk of cervical cancer. Testing for HPV may also be done on women of any age with unclear Pap test results.  Other health care providers may not recommend any screening for nonpregnant women who are considered low risk for pelvic cancer and who do not have symptoms. Ask your health care provider if a screening pelvic exam is right for   you.  If you have had past treatment for cervical cancer or a condition that could lead to cancer, you need Pap tests and screening for cancer for at least 20 years after your treatment. If Pap tests have been discontinued, your risk factors (such as having a new sexual partner) need to be reassessed to determine if screening should resume. Some women have medical problems that increase the chance of getting cervical cancer. In these cases, your health care provider may recommend more frequent screening and Pap tests.  Colorectal cancer can be detected and often prevented. Most routine colorectal cancer screening begins at the age of 50 years and continues through age 75 years. However, your health care provider may recommend screening at an earlier age if you have risk factors for colon cancer. On a yearly basis, your health care provider may provide home test kits to check  for hidden blood in the stool. Use of a small camera at the end of a tube, to directly examine the colon (sigmoidoscopy or colonoscopy), can detect the earliest forms of colorectal cancer. Talk to your health care provider about this at age 50, when routine screening begins. Direct exam of the colon should be repeated every 5-10 years through age 75 years, unless early forms of precancerous polyps or small growths are found.  People who are at an increased risk for hepatitis B should be screened for this virus. You are considered at high risk for hepatitis B if:  You were born in a country where hepatitis B occurs often. Talk with your health care provider about which countries are considered high risk.  Your parents were born in a high-risk country and you have not received a shot to protect against hepatitis B (hepatitis B vaccine).  You have HIV or AIDS.  You use needles to inject street drugs.  You live with, or have sex with, someone who has hepatitis B.  You get hemodialysis treatment.  You take certain medicines for conditions like cancer, organ transplantation, and autoimmune conditions.  Hepatitis C blood testing is recommended for all people born from 1945 through 1965 and any individual with known risks for hepatitis C.  Practice safe sex. Use condoms and avoid high-risk sexual practices to reduce the spread of sexually transmitted infections (STIs). STIs include gonorrhea, chlamydia, syphilis, trichomonas, herpes, HPV, and human immunodeficiency virus (HIV). Herpes, HIV, and HPV are viral illnesses that have no cure. They can result in disability, cancer, and death.  You should be screened for sexually transmitted illnesses (STIs) including gonorrhea and chlamydia if:  You are sexually active and are younger than 24 years.  You are older than 24 years and your health care provider tells you that you are at risk for this type of infection.  Your sexual activity has changed  since you were last screened and you are at an increased risk for chlamydia or gonorrhea. Ask your health care provider if you are at risk.  If you are at risk of being infected with HIV, it is recommended that you take a prescription medicine daily to prevent HIV infection. This is called preexposure prophylaxis (PrEP). You are considered at risk if:  You are sexually active and do not regularly use condoms or know the HIV status of your partner(s).  You take drugs by injection.  You are sexually active with a partner who has HIV.  Talk with your health care provider about whether you are at high risk of being infected with HIV. If   you choose to begin PrEP, you should first be tested for HIV. You should then be tested every 3 months for as long as you are taking PrEP.  Osteoporosis is a disease in which the bones lose minerals and strength with aging. This can result in serious bone fractures or breaks. The risk of osteoporosis can be identified using a bone density scan. Women ages 67 years and over and women at risk for fractures or osteoporosis should discuss screening with their health care providers. Ask your health care provider whether you should take a calcium supplement or vitamin D to reduce the rate of osteoporosis.  Menopause can be associated with physical symptoms and risks. Hormone replacement therapy is available to decrease symptoms and risks. You should talk to your health care provider about whether hormone replacement therapy is right for you.  Use sunscreen. Apply sunscreen liberally and repeatedly throughout the day. You should seek shade when your shadow is shorter than you. Protect yourself by wearing long sleeves, pants, a wide-brimmed hat, and sunglasses year round, whenever you are outdoors.  Once a month, do a whole body skin exam, using a mirror to look at the skin on your back. Tell your health care provider of new moles, moles that have irregular borders, moles that  are larger than a pencil eraser, or moles that have changed in shape or color.  Stay current with required vaccines (immunizations).  Influenza vaccine. All adults should be immunized every year.  Tetanus, diphtheria, and acellular pertussis (Td, Tdap) vaccine. Pregnant women should receive 1 dose of Tdap vaccine during each pregnancy. The dose should be obtained regardless of the length of time since the last dose. Immunization is preferred during the 27th-36th week of gestation. An adult who has not previously received Tdap or who does not know her vaccine status should receive 1 dose of Tdap. This initial dose should be followed by tetanus and diphtheria toxoids (Td) booster doses every 10 years. Adults with an unknown or incomplete history of completing a 3-dose immunization series with Td-containing vaccines should begin or complete a primary immunization series including a Tdap dose. Adults should receive a Td booster every 10 years.  Varicella vaccine. An adult without evidence of immunity to varicella should receive 2 doses or a second dose if she has previously received 1 dose. Pregnant females who do not have evidence of immunity should receive the first dose after pregnancy. This first dose should be obtained before leaving the health care facility. The second dose should be obtained 4-8 weeks after the first dose.  Human papillomavirus (HPV) vaccine. Females aged 13-26 years who have not received the vaccine previously should obtain the 3-dose series. The vaccine is not recommended for use in pregnant females. However, pregnancy testing is not needed before receiving a dose. If a female is found to be pregnant after receiving a dose, no treatment is needed. In that case, the remaining doses should be delayed until after the pregnancy. Immunization is recommended for any person with an immunocompromised condition through the age of 61 years if she did not get any or all doses earlier. During the  3-dose series, the second dose should be obtained 4-8 weeks after the first dose. The third dose should be obtained 24 weeks after the first dose and 16 weeks after the second dose.  Zoster vaccine. One dose is recommended for adults aged 30 years or older unless certain conditions are present.  Measles, mumps, and rubella (MMR) vaccine. Adults born  before 1957 generally are considered immune to measles and mumps. Adults born in 1957 or later should have 1 or more doses of MMR vaccine unless there is a contraindication to the vaccine or there is laboratory evidence of immunity to each of the three diseases. A routine second dose of MMR vaccine should be obtained at least 28 days after the first dose for students attending postsecondary schools, health care workers, or international travelers. People who received inactivated measles vaccine or an unknown type of measles vaccine during 1963-1967 should receive 2 doses of MMR vaccine. People who received inactivated mumps vaccine or an unknown type of mumps vaccine before 1979 and are at high risk for mumps infection should consider immunization with 2 doses of MMR vaccine. For females of childbearing age, rubella immunity should be determined. If there is no evidence of immunity, females who are not pregnant should be vaccinated. If there is no evidence of immunity, females who are pregnant should delay immunization until after pregnancy. Unvaccinated health care workers born before 1957 who lack laboratory evidence of measles, mumps, or rubella immunity or laboratory confirmation of disease should consider measles and mumps immunization with 2 doses of MMR vaccine or rubella immunization with 1 dose of MMR vaccine.  Pneumococcal 13-valent conjugate (PCV13) vaccine. When indicated, a person who is uncertain of his immunization history and has no record of immunization should receive the PCV13 vaccine. All adults 65 years of age and older should receive this  vaccine. An adult aged 19 years or older who has certain medical conditions and has not been previously immunized should receive 1 dose of PCV13 vaccine. This PCV13 should be followed with a dose of pneumococcal polysaccharide (PPSV23) vaccine. Adults who are at high risk for pneumococcal disease should obtain the PPSV23 vaccine at least 8 weeks after the dose of PCV13 vaccine. Adults older than 79 years of age who have normal immune system function should obtain the PPSV23 vaccine dose at least 1 year after the dose of PCV13 vaccine.  Pneumococcal polysaccharide (PPSV23) vaccine. When PCV13 is also indicated, PCV13 should be obtained first. All adults aged 65 years and older should be immunized. An adult younger than age 65 years who has certain medical conditions should be immunized. Any person who resides in a nursing home or long-term care facility should be immunized. An adult smoker should be immunized. People with an immunocompromised condition and certain other conditions should receive both PCV13 and PPSV23 vaccines. People with human immunodeficiency virus (HIV) infection should be immunized as soon as possible after diagnosis. Immunization during chemotherapy or radiation therapy should be avoided. Routine use of PPSV23 vaccine is not recommended for American Indians, [place] Natives, or people younger than 65 years unless there are medical conditions that require PPSV23 vaccine. When indicated, people who have unknown immunization and have no record of immunization should receive PPSV23 vaccine. One-time revaccination 5 years after the first dose of PPSV23 is recommended for people aged 19-64 years who have chronic kidney failure, nephrotic syndrome, asplenia, or immunocompromised conditions. People who received 1-2 doses of PPSV23 before age 65 years should receive another dose of PPSV23 vaccine at age 65 years or later if at least 5 years have passed since the previous dose. Doses of PPSV23 are not  needed for people immunized with PPSV23 at or after age 65 years.  Meningococcal vaccine. Adults with asplenia or persistent complement component deficiencies should receive 2 doses of quadrivalent meningococcal conjugate (MenACWY-D) vaccine. The doses should be obtained   at least 2 months apart. Microbiologists working with certain meningococcal bacteria, [place] recruits, people at risk during an outbreak, and people who travel to or live in countries with a high rate of meningitis should be immunized. A first-year college student up through age 34 years who is living in a residence hall should receive a dose if she did not receive a dose on or after her 16th birthday. Adults who have certain high-risk conditions should receive one or more doses of vaccine.  Hepatitis A vaccine. Adults who wish to be protected from this disease, have certain high-risk conditions, work with hepatitis A-infected animals, work in hepatitis A research labs, or travel to or work in countries with a high rate of hepatitis A should be immunized. Adults who were previously unvaccinated and who anticipate close contact with an international adoptee during the first 60 days after arrival in the [place] from a country with a high rate of hepatitis A should be immunized.  Hepatitis B vaccine. Adults who wish to be protected from this disease, have certain high-risk conditions, may be exposed to blood or other infectious body fluids, are household contacts or sex partners of hepatitis B positive people, are clients or workers in certain care facilities, or travel to or work in countries with a high rate of hepatitis B should be immunized.  Haemophilus influenzae type b (Hib) vaccine. A previously unvaccinated person with asplenia or sickle cell disease or having a scheduled splenectomy should receive 1 dose of Hib vaccine. Regardless of previous immunization, a recipient of a hematopoietic stem cell transplant should receive a  3-dose series 6-12 months after her successful transplant. Hib vaccine is not recommended for adults with HIV infection. Preventive Services / Frequency Ages 35 to 4 years  Blood pressure check.** / Every 3-5 years.  Lipid and cholesterol check.** / Every 5 years beginning at age 60.  Clinical breast exam.** / Every 3 years for women in their 71s and 10s.  BRCA-related cancer risk assessment.** / For women who have family members with a BRCA-related cancer (breast, ovarian, tubal, or peritoneal cancers).  Pap test.** / Every 2 years from ages 76 through 26. Every 3 years starting at age 61 through age 76 or 93 with a history of 3 consecutive normal Pap tests.  HPV screening.** / Every 3 years from ages 37 through ages 60 to 51 with a history of 3 consecutive normal Pap tests.  Hepatitis C blood test.** / For any individual with known risks for hepatitis C.  Skin self-exam. / Monthly.  Influenza vaccine. / Every year.  Tetanus, diphtheria, and acellular pertussis (Tdap, Td) vaccine.** / Consult your health care provider. Pregnant women should receive 1 dose of Tdap vaccine during each pregnancy. 1 dose of Td every 10 years.  Varicella vaccine.** / Consult your health care provider. Pregnant females who do not have evidence of immunity should receive the first dose after pregnancy.  HPV vaccine. / 3 doses over 6 months, if 93 and younger. The vaccine is not recommended for use in pregnant females. However, pregnancy testing is not needed before receiving a dose.  Measles, mumps, rubella (MMR) vaccine.** / You need at least 1 dose of MMR if you were born in 1957 or later. You may also need a 2nd dose. For females of childbearing age, rubella immunity should be determined. If there is no evidence of immunity, females who are not pregnant should be vaccinated. If there is no evidence of immunity, females who are  pregnant should delay immunization until after pregnancy.  Pneumococcal  13-valent conjugate (PCV13) vaccine.** / Consult your health care provider.  Pneumococcal polysaccharide (PPSV23) vaccine.** / 1 to 2 doses if you smoke cigarettes or if you have certain conditions.  Meningococcal vaccine.** / 1 dose if you are age 68 to 8 years and a Market researcher living in a residence hall, or have one of several medical conditions, you need to get vaccinated against meningococcal disease. You may also need additional booster doses.  Hepatitis A vaccine.** / Consult your health care provider.  Hepatitis B vaccine.** / Consult your health care provider.  Haemophilus influenzae type b (Hib) vaccine.** / Consult your health care provider. Ages 7 to 53 years  Blood pressure check.** / Every year.  Lipid and cholesterol check.** / Every 5 years beginning at age 25 years.  Lung cancer screening. / Every year if you are aged 11-80 years and have a 30-pack-year history of smoking and currently smoke or have quit within the past 15 years. Yearly screening is stopped once you have quit smoking for at least 15 years or develop a health problem that would prevent you from having lung cancer treatment.  Clinical breast exam.** / Every year after age 48 years.  BRCA-related cancer risk assessment.** / For women who have family members with a BRCA-related cancer (breast, ovarian, tubal, or peritoneal cancers).  Mammogram.** / Every year beginning at age 41 years and continuing for as long as you are in good health. Consult with your health care provider.  Pap test.** / Every 3 years starting at age 65 years through age 37 or 70 years with a history of 3 consecutive normal Pap tests.  HPV screening.** / Every 3 years from ages 72 years through ages 60 to 40 years with a history of 3 consecutive normal Pap tests.  Fecal occult blood test (FOBT) of stool. / Every year beginning at age 21 years and continuing until age 5 years. You may not need to do this test if you get  a colonoscopy every 10 years.  Flexible sigmoidoscopy or colonoscopy.** / Every 5 years for a flexible sigmoidoscopy or every 10 years for a colonoscopy beginning at age 35 years and continuing until age 48 years.  Hepatitis C blood test.** / For all people born from [birth year] through 1965 and any individual with known risks for hepatitis C.  Skin self-exam. / Monthly.  Influenza vaccine. / Every year.  Tetanus, diphtheria, and acellular pertussis (Tdap/Td) vaccine.** / Consult your health care provider. Pregnant women should receive 1 dose of Tdap vaccine during each pregnancy. 1 dose of Td every 10 years.  Varicella vaccine.** / Consult your health care provider. Pregnant females who do not have evidence of immunity should receive the first dose after pregnancy.  Zoster vaccine.** / 1 dose for adults aged 30 years or older.  Measles, mumps, rubella (MMR) vaccine.** / You need at least 1 dose of MMR if you were born in 1957 or later. You may also need a second dose. For females of childbearing age, rubella immunity should be determined. If there is no evidence of immunity, females who are not pregnant should be vaccinated. If there is no evidence of immunity, females who are pregnant should delay immunization until after pregnancy.  Pneumococcal 13-valent conjugate (PCV13) vaccine.** / Consult your health care provider.  Pneumococcal polysaccharide (PPSV23) vaccine.** / 1 to 2 doses if you smoke cigarettes or if you have certain conditions.  Meningococcal vaccine.** /  Consult your health care provider.  Hepatitis A vaccine.** / Consult your health care provider.  Hepatitis B vaccine.** / Consult your health care provider.  Haemophilus influenzae type b (Hib) vaccine.** / Consult your health care provider. Ages 64 years and over  Blood pressure check.** / Every year.  Lipid and cholesterol check.** / Every 5 years beginning at age 23 years.  Lung cancer screening. / Every year if you  are aged 16-80 years and have a 30-pack-year history of smoking and currently smoke or have quit within the past 15 years. Yearly screening is stopped once you have quit smoking for at least 15 years or develop a health problem that would prevent you from having lung cancer treatment.  Clinical breast exam.** / Every year after age 74 years.  BRCA-related cancer risk assessment.** / For women who have family members with a BRCA-related cancer (breast, ovarian, tubal, or peritoneal cancers).  Mammogram.** / Every year beginning at age 44 years and continuing for as long as you are in good health. Consult with your health care provider.  Pap test.** / Every 3 years starting at age 58 years through age 22 or 39 years with 3 consecutive normal Pap tests. Testing can be stopped between 65 and 70 years with 3 consecutive normal Pap tests and no abnormal Pap or HPV tests in the past 10 years.  HPV screening.** / Every 3 years from ages 64 years through ages 70 or 61 years with a history of 3 consecutive normal Pap tests. Testing can be stopped between 65 and 70 years with 3 consecutive normal Pap tests and no abnormal Pap or HPV tests in the past 10 years.  Fecal occult blood test (FOBT) of stool. / Every year beginning at age 40 years and continuing until age 27 years. You may not need to do this test if you get a colonoscopy every 10 years.  Flexible sigmoidoscopy or colonoscopy.** / Every 5 years for a flexible sigmoidoscopy or every 10 years for a colonoscopy beginning at age 7 years and continuing until age 32 years.  Hepatitis C blood test.** / For all people born from [birth year] through 1963 and any individual with known risks for hepatitis C.  Osteoporosis screening.** / A one-time screening for women ages 30 years and over and women at risk for fractures or osteoporosis.  Skin self-exam. / Monthly.  Influenza vaccine. / Every year.  Tetanus, diphtheria, and acellular pertussis (Tdap/Td)  vaccine.** / 1 dose of Td every 10 years.  Varicella vaccine.** / Consult your health care provider.  Zoster vaccine.** / 1 dose for adults aged 35 years or older.  Pneumococcal 13-valent conjugate (PCV13) vaccine.** / Consult your health care provider.  Pneumococcal polysaccharide (PPSV23) vaccine.** / 1 dose for all adults aged 46 years and older.  Meningococcal vaccine.** / Consult your health care provider.  Hepatitis A vaccine.** / Consult your health care provider.  Hepatitis B vaccine.** / Consult your health care provider.  Haemophilus influenzae type b (Hib) vaccine.** / Consult your health care provider. ** Family history and personal history of risk and conditions may change your health care provider's recommendations.   This information is not intended to replace advice given to you by your health care provider. Make sure you discuss any questions you have with your health care provider.   Document Released: 06/13/2001 Document Revised: 05/08/2014 Document Reviewed: 09/12/2010 Elsevier Interactive Patient Education [employer].

## 2015-03-09 NOTE — Progress Notes (Signed)
Subjective:  Patient ID: Brianna Mcdonald, female    DOB: 1933/01/15  Age: 82 y.o. MRN: [id number]  CC: Hypertension; Hyperlipidemia; Diabetes; and Annual Exam   HPI Brianna Mcdonald presents for a complete physical. She complains of intermittent constipation, having long toenails and a several year history of dizzy spells. She has no new or different complaints today.  Outpatient Prescriptions Prior to Visit  Medication Sig Dispense Refill  . albuterol (PROAIR HFA) 108 (90 BASE) MCG/ACT inhaler Inhale 1 puff into the lungs every 6 (six) hours as needed for wheezing or shortness of breath (or with excersize).    . CALCIUM-MAGNESUIUM-ZINC 333-133-8.3 MG TABS Take 1 tablet by mouth 2 (two) times daily.     . Cyanocobalamin (VITAMIN B-12) 1000 MCG SUBL Place 1 tablet under the tongue daily.     Marland Kitchen desloratadine (CLARINEX) 5 MG tablet TAKE ONE TABLET BY MOUTH ONCE DAILY 90 tablet 3  . fish oil-omega-3 fatty acids 1000 MG capsule Take 1 capsule by mouth daily.     . fluticasone (FLONASE) 50 MCG/ACT nasal spray USE TWO SPRAYS IN EACH NOSTRIL EVERY DAY 16 g 5  . lansoprazole (PREVACID) 30 MG capsule Take 1 capsule (30 mg total) by mouth daily. 90 capsule 2  . niacin (NIASPAN) 500 MG CR tablet Take 250-500 mg by mouth 2 (two) times daily. Take 250 mg in the am and 500 mg at bedtime    . metoprolol succinate (TOPROL-XL) 25 MG 24 hr tablet TAKE 1 TABLET DAILY 90 tablet 2   No facility-administered medications prior to visit.    ROS Review of Systems  Constitutional: Negative.  Negative for fever, chills, diaphoresis, appetite change and fatigue.  HENT: Negative.  Negative for trouble swallowing and voice change.   Eyes: Negative.   Respiratory: Negative.  Negative for cough, choking, chest tightness, shortness of breath and stridor.   Cardiovascular: Negative.  Negative for chest pain, palpitations and leg swelling.  Gastrointestinal: Negative.  Negative for nausea, vomiting, abdominal  pain, diarrhea and constipation.  Endocrine: Negative.  Negative for polydipsia, polyphagia and polyuria.  Genitourinary: Negative.  Negative for dysuria, urgency, frequency, hematuria, enuresis and difficulty urinating.  Musculoskeletal: Negative.  Negative for myalgias, back pain, joint swelling and arthralgias.  Skin: Negative.  Negative for pallor and rash.  Allergic/Immunologic: Negative.   Neurological: Positive for dizziness. Negative for tremors, syncope, light-headedness and numbness.  Hematological: Negative.  Negative for adenopathy. Does not bruise/bleed easily.  Psychiatric/Behavioral: Negative.     Objective:  BP 122/68 mmHg  Pulse 60  Temp(Src) 97.9 F (36.6 C) (Oral)  Resp 16  Ht  (1.549 m)  Wt 145 lb (65.772 kg)  BMI 27.41 kg/m2  SpO2 97%  BP Readings from Last 3 Encounters:  03/09/15 122/68  01/28/14 112/70  01/22/14 118/68    Wt Readings from Last 3 Encounters:  03/09/15 145 lb (65.772 kg)  01/28/14 145 lb (65.772 kg)  01/22/14 145 lb (65.772 kg)    Physical Exam  Constitutional: She is oriented to person, place, and time. No distress.  HENT:  Head: Normocephalic and atraumatic.  Mouth/Throat: Oropharynx is clear and moist. No oropharyngeal exudate.  Eyes: Conjunctivae are normal. Right eye exhibits no discharge. Left eye exhibits no discharge. No scleral icterus.  Neck: Normal range of motion. Neck supple. No JVD present. No tracheal deviation present. No thyromegaly present.  Cardiovascular: Normal rate, regular rhythm, normal heart sounds and intact distal pulses.  Exam reveals no gallop and no friction  rub.   No murmur heard. Pulmonary/Chest: Effort normal and breath sounds normal. No stridor. No respiratory distress. She has no wheezes. She has no rales. She exhibits no tenderness.  Abdominal: Soft. Bowel sounds are normal. She exhibits no distension and no mass. There is no tenderness. There is no rebound and no guarding.  Musculoskeletal:  She exhibits no edema or tenderness.  Lymphadenopathy:    She has no cervical adenopathy.  Neurological: She is oriented to person, place, and time.  Skin: Skin is warm and dry. No rash noted. She is not diaphoretic. No erythema. No pallor.  Psychiatric: She has a normal mood and affect. Her behavior is normal. Judgment and thought content normal.  Vitals reviewed.   Lab Results  Component Value Date   WBC 5.1 03/09/2015   HGB 13.3 03/09/2015   HCT 40.5 03/09/2015   PLT 173.0 03/09/2015   GLUCOSE 119* 03/09/2015   CHOL 214* 03/09/2015   TRIG 93.0 03/09/2015   HDL 69.00 03/09/2015   LDLDIRECT 144.4 10/30/2012   LDLCALC 126* 03/09/2015   ALT 13 03/09/2015   AST 18 03/09/2015   NA 140 03/09/2015   K 4.6 03/09/2015   CL 102 03/09/2015   CREATININE 0.78 03/09/2015   BUN 16 03/09/2015   CO2 30 03/09/2015   TSH 0.94 03/09/2015   HGBA1C 6.4 03/09/2015   MICROALBUR <0.7 03/09/2015    Dg Chest 2 View  04/02/2013  CLINICAL DATA:  History of asthma and, now with cough and congestion and chills. EXAM: CHEST  2 VIEW COMPARISON:  Chest x-ray of December 01, 2007. FINDINGS: The lungs are adequately inflated. There is no focal infiltrate. The cardiopericardial silhouette is normal in size. The pulmonary vascularity is not engorged. The mediastinum is normal in width. There is mild tortuosity of the descending thoracic aorta. There is no pleural effusion. IMPRESSION: There is no evidence of pneumonia nor CHF nor other acute cardiopulmonary abnormality. Electronically Signed   By: David  SwazilandJordan   On: 04/02/2013 13:13    Assessment & Plan:   Brianna BellmanRuby was seen today for hypertension, hyperlipidemia, diabetes and annual exam.  Diagnoses and all orders for this visit:  Essential hypertension, benign- Her BP is well controlled, lytes and renal function are stable -     Comprehensive metabolic panel; Future -     CBC with Differential/Platelet; Future -     TSH; Future -     Urinalysis, Routine w  reflex microscopic (not at [facility]); Future  Type 2 diabetes mellitus with complication, without long-term current use of insulin (HCC)- her blood sugars are well controlled, no changes are needed -     Comprehensive metabolic panel; Future -     Microalbumin / creatinine urine ratio; Future -     Hemoglobin A1c; Future  Hyperlipidemia with target LDL less than 100- she has not achieved her LDL, she is NOT wiling to take a statin -     Lipid panel; Future -     Comprehensive metabolic panel; Future -     TSH; Future  Encounter for immunization  Other orders -     Flu Vaccine QUAD 36+ mos IM  I am having Ms. Brianna Mcdonald maintain her niacin, fish oil-omega-3 fatty acids, CALCIUM-MAGNESUIUM-ZINC, Vitamin B-12, albuterol, fluticasone, lansoprazole, desloratadine, and selenium.  Meds ordered this encounter  Medications  . selenium 50 MCG TABS tablet    Sig: Take by mouth.   See AVS for instructions about healthy living and anticipatory guidance.  Follow-up: Return  in about 6 months (around 09/06/2015).  Sanda Linger, MD

## 2015-03-12 ENCOUNTER — Other Ambulatory Visit: Payer: Self-pay | Admitting: Internal Medicine

## 2015-03-14 DIAGNOSIS — Z0000 Encounter for general adult medical examination without abnormal findings: Secondary | ICD-10-CM | POA: Insufficient documentation

## 2015-03-14 NOTE — Assessment & Plan Note (Signed)

## 2015-03-19 ENCOUNTER — Telehealth: Payer: Self-pay

## 2015-03-19 NOTE — Telephone Encounter (Signed)
[facility] Calls from 03/11/2015: (2 calls documented below)  PLEASE NOTE: All timestamps contained within this report are represented as [place] Standard Time. CONFIDENTIALTY NOTICE: This fax transmission is intended only for the addressee. It contains information that is legally privileged, confidential or otherwise protected from use or disclosure. If you are not the intended recipient, you are strictly prohibited from reviewing, disclosing, copying using or disseminating any of this information or taking any action in reliance on or regarding this information. If you have received this fax in error, please notify us immediately by telephone so that we can arrange for its return to us. Phone: [phone], Toll-Free: [phone], Fax: [phone] Page: 1 of 1 Call Id: [id number] [facility] - Client TELEPHONE ADVICE RECORD [facility] Patient Name: Laurence SlateRUBY Hollopeter Gender: Female DOB: 1933-02-12 Age: [age] Y 5 M 23 D Return Phone Number: [phone] (Primary), [phone] (Secondary) Address: City/State/Zip: [place] Client [facility] - Client Client Site [facility] Night Physician Sanda LingerJones, Thomas Contact Type Call Call Type Triage / Clinical Relationship To Patient Self Return Phone Number [phone] (Primary) Chief Complaint Prescription Refill or Medication Request (non symptomatic) Initial Comment Caller states she is out of medication and did not realize she is out of refills. Nurse Assessment Guidelines Guideline Title Affirmed Question Affirmed Notes Nurse Date/Time (Eastern Time) Disp. Time Lamount Cohen(Eastern Time) Disposition Final User 03/12/2015 6:18:28 PM Attempt made - no message left Anner CreteWells, RN, Massie BougieBelinda 03/12/2015 6:59:56 PM FINAL ATTEMPT MADE - no message left Yes Anner CreteWells, RN, Belinda After Care Instructions Given Call Event Type User Date / Time Description Comments User: Suzy BouchardBelinda, Wells, RN  Date/Time (Eastern Time): 03/12/2015 6:59:42 PM Made two attempts to contact caller but unable to leave voice mail message. Closed call.  [facility] - Client TELEPHONE ADVICE RECORD [facility] Patient Name: Laurence SlateRUBY Sharpnack Gender: Unknown DOB: 1933-02-12 Age: [age] Y 5 M 20 D Return Phone Number: [phone] (Primary), [phone] (Secondary) Address: City/State/Zip: [place] Client [facility] - Client Client Site [facility] - Day Physician Sanda LingerJones, Thomas Contact Type Call Call Type Triage / Clinical Relationship To Patient Self Appointment Disposition EMR Appointment Not Necessary Info pasted into Epic No Return Phone Number [phone] (Secondary) Chief Complaint Prescription Refill or Medication Request (non symptomatic) Initial Comment Caller states she is out of medication and did not realize she is out of refills. Has medication questions. Nurse Assessment Nurse: Odis LusterBowers, RN, Bjorn Loserhonda Date/Time Lamount Cohen(Eastern Time): 03/12/2015 6:06:01 PM Please select the assessment type ---Refill Additional Documentation ---Caller states she is out of medication and did not realize she is out of refills. Has medication questions. Reports that she needs Metoprolol, refills have expired. Does the patient have enough medication to last until the office opens? ---Unable to obtain loaner dose from Pharmacy Does the client directives allow for assistance with medications after hours? ---No Additional Documentation ---Advised nurse will alert the MD office that she needs a refill of this medication. Reports that she doesn't have transportation to pick up med this weekend anyway. Guidelines Guideline Title Affirmed Question Affirmed Notes Nurse Date/Time (Eastern Time) Disp. Time Lamount Cohen(Eastern Time) Disposition Final User 03/12/2015 5:50:43 PM Attempt made - no message left Kerrin ChampagneBowers, RN, Rhonda 03/12/2015 6:10:07 PM Clinical Call Yes  Odis LusterBowers, RN, Bjorn Loserhonda After Care Instructions Given Call Event Type User Date / Time Description

## 2015-05-19 ENCOUNTER — Other Ambulatory Visit: Payer: Self-pay | Admitting: Internal Medicine

## 2015-05-28 DIAGNOSIS — H401131 Primary open-angle glaucoma, bilateral, mild stage: Secondary | ICD-10-CM | POA: Diagnosis not present

## 2015-05-28 DIAGNOSIS — H2513 Age-related nuclear cataract, bilateral: Secondary | ICD-10-CM | POA: Diagnosis not present

## 2015-05-28 DIAGNOSIS — H53453 Other localized visual field defect, bilateral: Secondary | ICD-10-CM | POA: Diagnosis not present

## 2015-05-28 DIAGNOSIS — H401130 Primary open-angle glaucoma, bilateral, stage unspecified: Secondary | ICD-10-CM | POA: Insufficient documentation

## 2015-06-17 LAB — HM DIABETES EYE EXAM

## 2015-06-25 DIAGNOSIS — H2513 Age-related nuclear cataract, bilateral: Secondary | ICD-10-CM | POA: Diagnosis not present

## 2015-06-25 DIAGNOSIS — H401131 Primary open-angle glaucoma, bilateral, mild stage: Secondary | ICD-10-CM | POA: Diagnosis not present

## 2015-07-27 ENCOUNTER — Encounter: Payer: Self-pay | Admitting: Internal Medicine

## 2015-08-24 ENCOUNTER — Other Ambulatory Visit (INDEPENDENT_AMBULATORY_CARE_PROVIDER_SITE_OTHER): Payer: Medicare Other

## 2015-08-24 ENCOUNTER — Encounter: Payer: Self-pay | Admitting: Internal Medicine

## 2015-08-24 ENCOUNTER — Ambulatory Visit (INDEPENDENT_AMBULATORY_CARE_PROVIDER_SITE_OTHER): Payer: Medicare Other | Admitting: Internal Medicine

## 2015-08-24 VITALS — BP 122/68 | HR 60 | Temp 97.6°F | Resp 16 | Ht 61.0 in | Wt 140.0 lb

## 2015-08-24 DIAGNOSIS — E118 Type 2 diabetes mellitus with unspecified complications: Secondary | ICD-10-CM

## 2015-08-24 DIAGNOSIS — J301 Allergic rhinitis due to pollen: Secondary | ICD-10-CM

## 2015-08-24 DIAGNOSIS — Z23 Encounter for immunization: Secondary | ICD-10-CM | POA: Diagnosis not present

## 2015-08-24 DIAGNOSIS — I10 Essential (primary) hypertension: Secondary | ICD-10-CM

## 2015-08-24 LAB — BASIC METABOLIC PANEL
BUN: 13 mg/dL (ref 6–23)
CHLORIDE: 102 meq/L (ref 96–112)
CO2: 31 mEq/L (ref 19–32)
Calcium: 9.7 mg/dL (ref 8.4–10.5)
Creatinine, Ser: 0.86 mg/dL (ref 0.40–1.20)
GFR: 81.46 mL/min (ref >60.00)
GLUCOSE: 114 mg/dL — AB (ref 70–99)
Potassium: 4.1 mEq/L (ref 3.5–5.1)
SODIUM: 139 meq/L (ref 135–145)

## 2015-08-24 LAB — HEMOGLOBIN A1C: HEMOGLOBIN A1C: 6.5 % (ref 4.6–6.5)

## 2015-08-24 MED ORDER — FLUTICASONE PROPIONATE 50 MCG/ACT NA SUSP: 2.0000 | Freq: Every day | NASAL | Status: DC

## 2015-08-24 NOTE — Patient Instructions (Signed)
Hypertension Hypertension, commonly called high blood pressure, is when the force of blood pumping through your arteries is too strong. Your arteries are the blood vessels that carry blood from your heart throughout your body. A blood pressure reading consists of a higher number over a lower number, such as 110/72. The higher number (systolic) is the pressure inside your arteries when your heart pumps. The lower number (diastolic) is the pressure inside your arteries when your heart relaxes. Ideally you want your blood pressure below 120/80. Hypertension forces your heart to work harder to pump blood. Your arteries may become narrow or stiff. Having untreated or uncontrolled hypertension can cause heart attack, stroke, kidney disease, and other problems. RISK FACTORS Some risk factors for high blood pressure are controllable. Others are not.  Risk factors you cannot control include:   Race. You may be at higher risk if you are African American.  Age. Risk increases with age.  Gender. Men are at higher risk than women before age 45 years. After age 65, women are at higher risk than men. Risk factors you can control include:  Not getting enough exercise or physical activity.  Being overweight.  Getting too much fat, sugar, calories, or salt in your diet.  Drinking too much alcohol. SIGNS AND SYMPTOMS Hypertension does not usually cause signs or symptoms. Extremely high blood pressure (hypertensive crisis) may cause headache, anxiety, shortness of breath, and nosebleed. DIAGNOSIS To check if you have hypertension, your health care provider will measure your blood pressure while you are seated, with your arm held at the level of your heart. It should be measured at least twice using the same arm. Certain conditions can cause a difference in blood pressure between your right and left arms. A blood pressure reading that is higher than normal on one occasion does not mean that you need treatment. If  it is not clear whether you have high blood pressure, you may be asked to return on a different day to have your blood pressure checked again. Or, you may be asked to monitor your blood pressure at home for 1 or more weeks. TREATMENT Treating high blood pressure includes making lifestyle changes and possibly taking medicine. Living a healthy lifestyle can help lower high blood pressure. You may need to change some of your habits. Lifestyle changes may include:  Following the DASH diet. This diet is high in fruits, vegetables, and whole grains. It is low in salt, red meat, and added sugars.  Keep your sodium intake below 2,300 mg per day.  Getting at least 30-45 minutes of aerobic exercise at least 4 times per week.  Losing weight if necessary.  Not smoking.  Limiting alcoholic beverages.  Learning ways to reduce stress. Your health care provider may prescribe medicine if lifestyle changes are not enough to get your blood pressure under control, and if one of the following is true:  You are 18-59 years of age and your systolic blood pressure is above 140.  You are 60 years of age or older, and your systolic blood pressure is above 150.  Your diastolic blood pressure is above 90.  You have diabetes, and your systolic blood pressure is over 140 or your diastolic blood pressure is over 90.  You have kidney disease and your blood pressure is above 140/90.  You have heart disease and your blood pressure is above 140/90. Your personal target blood pressure may vary depending on your medical conditions, your age, and other factors. HOME CARE INSTRUCTIONS    Have your blood pressure rechecked as directed by your health care provider.   Take medicines only as directed by your health care provider. Follow the directions carefully. Blood pressure medicines must be taken as prescribed. The medicine does not work as well when you skip doses. Skipping doses also puts you at risk for  problems.  Do not smoke.   Monitor your blood pressure at home as directed by your health care provider. SEEK MEDICAL CARE IF:   You think you are having a reaction to medicines taken.  You have recurrent headaches or feel dizzy.  You have swelling in your ankles.  You have trouble with your vision. SEEK IMMEDIATE MEDICAL CARE IF:  You develop a severe headache or confusion.  You have unusual weakness, numbness, or feel faint.  You have severe chest or abdominal pain.  You vomit repeatedly.  You have trouble breathing. MAKE SURE YOU:   Understand these instructions.  Will watch your condition.  Will get help right away if you are not doing well or get worse.   This information is not intended to replace advice given to you by your health care provider. Make sure you discuss any questions you have with your health care provider.   Document Released: 04/17/2005 Document Revised: 09/01/2014 Document Reviewed: 02/07/2013 Elsevier Interactive Patient Education 2016 [employer]  

## 2015-08-24 NOTE — Progress Notes (Signed)
Pre visit review using our clinic review tool, if applicable. No additional management support is needed unless otherwise documented below in the visit note. 

## 2015-08-24 NOTE — Progress Notes (Signed)
Subjective:  Patient ID: Brianna Mcdonald, female    DOB: 01/25/1933  Age: 82 y.o. MRN: [id number]  CC: Hypertension and Diabetes   HPI Brianna Mcdonald presents for a follow-up on hypertension and diabetes. She tells me that her blood pressure has been well controlled on metoprolol. She also thinks her blood sugar has been well controlled as she has had no visual changes, polyuria, polydipsia, or polyphagia.  Outpatient Prescriptions Prior to Visit  Medication Sig Dispense Refill  . albuterol (PROAIR HFA) 108 (90 BASE) MCG/ACT inhaler Inhale 1 puff into the lungs every 6 (six) hours as needed for wheezing or shortness of breath (or with excersize).    . CALCIUM-MAGNESUIUM-ZINC 333-133-8.3 MG TABS Take 1 tablet by mouth 2 (two) times daily.     . Cyanocobalamin (VITAMIN B-12) 1000 MCG SUBL Place 1 tablet under the tongue daily.     Marland Kitchen. desloratadine (CLARINEX) 5 MG tablet TAKE ONE TABLET BY MOUTH ONCE DAILY 90 tablet 3  . fish oil-omega-3 fatty acids 1000 MG capsule Take 1 capsule by mouth daily.     . lansoprazole (PREVACID) 30 MG capsule TAKE ONE CAPSULE BY MOUTH ONCE DAILY 90 capsule 3  . metoprolol succinate (TOPROL-XL) 25 MG 24 hr tablet TAKE ONE TABLET BY MOUTH ONCE DAILY 90 tablet 3  . niacin (NIASPAN) 500 MG CR tablet Take 250-500 mg by mouth 2 (two) times daily. Take 250 mg in the am and 500 mg at bedtime    . selenium 50 MCG TABS tablet Take by mouth.    . fluticasone (FLONASE) 50 MCG/ACT nasal spray USE TWO SPRAYS IN EACH NOSTRIL EVERY DAY 16 g 5   No facility-administered medications prior to visit.    ROS Review of Systems  Constitutional: Negative.   HENT: Positive for congestion, postnasal drip and rhinorrhea. Negative for sinus pressure, sneezing, sore throat and trouble swallowing.   Eyes: Negative.  Negative for visual disturbance.  Respiratory: Negative.  Negative for cough, choking, chest tightness, shortness of breath and stridor.   Cardiovascular: Negative.   Negative for chest pain, palpitations and leg swelling.  Gastrointestinal: Negative.  Negative for nausea, vomiting, abdominal pain, diarrhea and constipation.  Endocrine: Negative.   Genitourinary: Negative.  Negative for dysuria, urgency, hematuria, decreased urine volume and difficulty urinating.  Musculoskeletal: Negative.  Negative for myalgias and back pain.  Skin: Negative.  Negative for color change and rash.  Allergic/Immunologic: Negative.   Neurological: Negative.  Negative for dizziness, tremors, syncope, light-headedness, numbness and headaches.  Hematological: Negative.  Negative for adenopathy. Does not bruise/bleed easily.  Psychiatric/Behavioral: Negative.     Objective:  BP 122/68 mmHg  Pulse 60  Temp(Src) 97.6 F (36.4 C) (Oral)  Resp 16  Ht 5\' 1"  (1.549 m)  Wt 140 lb (63.504 kg)  BMI 26.47 kg/m2  SpO2 98%  BP Readings from Last 3 Encounters:  08/24/15 122/68  03/09/15 122/68  01/28/14 112/70    Wt Readings from Last 3 Encounters:  08/24/15 140 lb (63.504 kg)  03/09/15 145 lb (65.772 kg)  01/28/14 145 lb (65.772 kg)    Physical Exam  Constitutional: She is oriented to person, place, and time. No distress.  HENT:  Mouth/Throat: Oropharynx is clear and moist. No oropharyngeal exudate.  Eyes: Conjunctivae are normal. Right eye exhibits no discharge. Left eye exhibits no discharge. No scleral icterus.  Neck: Normal range of motion. Neck supple. No JVD present. No tracheal deviation present. No thyromegaly present.  Cardiovascular: Normal rate, regular rhythm,  normal heart sounds and intact distal pulses.  Exam reveals no gallop and no friction rub.   No murmur heard. Pulmonary/Chest: Effort normal and breath sounds normal. No stridor. No respiratory distress. She has no wheezes. She has no rales. She exhibits no tenderness.  Abdominal: Soft. Bowel sounds are normal. She exhibits no distension and no mass. There is no tenderness. There is no rebound and no  guarding.  Musculoskeletal: Normal range of motion. She exhibits no edema or tenderness.  Lymphadenopathy:    She has no cervical adenopathy.  Neurological: She is oriented to person, place, and time.  Skin: Skin is warm and dry. No rash noted. She is not diaphoretic. No erythema. No pallor.  Vitals reviewed.   Lab Results  Component Value Date   WBC 5.1 03/09/2015   HGB 13.3 03/09/2015   HCT 40.5 03/09/2015   PLT 173.0 03/09/2015   GLUCOSE 114* 08/24/2015   CHOL 214* 03/09/2015   TRIG 93.0 03/09/2015   HDL 69.00 03/09/2015   LDLDIRECT 144.4 10/30/2012   LDLCALC 126* 03/09/2015   ALT 13 03/09/2015   AST 18 03/09/2015   NA 139 08/24/2015   K 4.1 08/24/2015   CL 102 08/24/2015   CREATININE 0.86 08/24/2015   BUN 13 08/24/2015   CO2 31 08/24/2015   TSH 0.94 03/09/2015   HGBA1C 6.5 08/24/2015   MICROALBUR <0.7 03/09/2015    Dg Chest 2 View  04/02/2013  CLINICAL DATA:  History of asthma and, now with cough and congestion and chills. EXAM: CHEST  2 VIEW COMPARISON:  Chest x-ray of December 01, 2007. FINDINGS: The lungs are adequately inflated. There is no focal infiltrate. The cardiopericardial silhouette is normal in size. The pulmonary vascularity is not engorged. The mediastinum is normal in width. There is mild tortuosity of the descending thoracic aorta. There is no pleural effusion. IMPRESSION: There is no evidence of pneumonia nor CHF nor other acute cardiopulmonary abnormality. Electronically Signed   By: David  [place]   On: 04/02/2013 13:13    Assessment & Plan:   Brianna Mcdonald was seen today for hypertension and diabetes.  Diagnoses and all orders for this visit:  Essential hypertension, benign- her blood pressure is well-controlled, electrolytes and renal function are stable. -     Basic metabolic panel; Future  Type 2 diabetes mellitus with complication, without long-term current use of insulin (HCC)- her A1c is 6.5%, her blood sugars are adequately well controlled, no  medications are needed at this time. -     Basic metabolic panel; Future -     Hemoglobin A1c; Future  Allergic rhinitis due to pollen -     fluticasone (FLONASE) 50 MCG/ACT nasal spray; Place 2 sprays into both nostrils daily.  Need for 23-polyvalent pneumococcal polysaccharide vaccine -     Pneumococcal polysaccharide vaccine 23-valent greater than or equal to 2yo subcutaneous/IM  I have changed Brianna Mcdonald's fluticasone. I am also having her maintain her niacin, fish oil-omega-3 fatty acids, CALCIUM-MAGNESUIUM-ZINC, Vitamin B-12, albuterol, desloratadine, selenium, metoprolol succinate, lansoprazole, and latanoprost.  Meds ordered this encounter  Medications  . latanoprost (XALATAN) 0.005 % ophthalmic solution    Sig: Apply to eye.  . fluticasone (FLONASE) 50 MCG/ACT nasal spray    Sig: Place 2 sprays into both nostrils daily.    Dispense:  16 g    Refill:  11     Follow-up: Return in about 6 months (around 02/23/2016).  Sanda Linger, MD

## 2015-09-06 ENCOUNTER — Ambulatory Visit: Payer: Self-pay | Admitting: Internal Medicine

## 2015-11-02 ENCOUNTER — Other Ambulatory Visit: Payer: Self-pay | Admitting: Internal Medicine

## 2015-11-03 DIAGNOSIS — H401131 Primary open-angle glaucoma, bilateral, mild stage: Secondary | ICD-10-CM | POA: Diagnosis not present

## 2015-11-09 ENCOUNTER — Emergency Department (HOSPITAL_COMMUNITY): Payer: Medicare Other

## 2015-11-09 ENCOUNTER — Observation Stay (HOSPITAL_BASED_OUTPATIENT_CLINIC_OR_DEPARTMENT_OTHER): Payer: Medicare Other

## 2015-11-09 ENCOUNTER — Observation Stay (HOSPITAL_COMMUNITY)
Admission: EM | Admit: 2015-11-09 | Discharge: 2015-11-10 | Disposition: A | Discharge status: Home / Self Care | Payer: Medicare Other | Attending: Internal Medicine | Admitting: Internal Medicine

## 2015-11-09 ENCOUNTER — Encounter (HOSPITAL_COMMUNITY): Payer: Self-pay | Admitting: *Deleted

## 2015-11-09 DIAGNOSIS — R52 Pain, unspecified: Secondary | ICD-10-CM

## 2015-11-09 DIAGNOSIS — A084 Viral intestinal infection, unspecified: Principal | ICD-10-CM | POA: Insufficient documentation

## 2015-11-09 DIAGNOSIS — Z9049 Acquired absence of other specified parts of digestive tract: Secondary | ICD-10-CM | POA: Insufficient documentation

## 2015-11-09 DIAGNOSIS — M79605 Pain in left leg: Secondary | ICD-10-CM | POA: Diagnosis not present

## 2015-11-09 DIAGNOSIS — M79604 Pain in right leg: Secondary | ICD-10-CM | POA: Diagnosis not present

## 2015-11-09 DIAGNOSIS — K219 Gastro-esophageal reflux disease without esophagitis: Secondary | ICD-10-CM | POA: Diagnosis not present

## 2015-11-09 DIAGNOSIS — K2970 Gastritis, unspecified, without bleeding: Secondary | ICD-10-CM | POA: Diagnosis not present

## 2015-11-09 DIAGNOSIS — R7989 Other specified abnormal findings of blood chemistry: Secondary | ICD-10-CM | POA: Diagnosis not present

## 2015-11-09 DIAGNOSIS — E118 Type 2 diabetes mellitus with unspecified complications: Secondary | ICD-10-CM | POA: Diagnosis not present

## 2015-11-09 DIAGNOSIS — R112 Nausea with vomiting, unspecified: Secondary | ICD-10-CM

## 2015-11-09 DIAGNOSIS — R079 Chest pain, unspecified: Secondary | ICD-10-CM | POA: Diagnosis not present

## 2015-11-09 DIAGNOSIS — K529 Noninfective gastroenteritis and colitis, unspecified: Secondary | ICD-10-CM | POA: Diagnosis present

## 2015-11-09 DIAGNOSIS — I10 Essential (primary) hypertension: Secondary | ICD-10-CM | POA: Diagnosis present

## 2015-11-09 DIAGNOSIS — E785 Hyperlipidemia, unspecified: Secondary | ICD-10-CM | POA: Diagnosis not present

## 2015-11-09 DIAGNOSIS — I700 Atherosclerosis of aorta: Secondary | ICD-10-CM | POA: Diagnosis not present

## 2015-11-09 DIAGNOSIS — R1012 Left upper quadrant pain: Secondary | ICD-10-CM | POA: Diagnosis not present

## 2015-11-09 DIAGNOSIS — Z79899 Other long term (current) drug therapy: Secondary | ICD-10-CM | POA: Insufficient documentation

## 2015-11-09 DIAGNOSIS — R778 Other specified abnormalities of plasma proteins: Secondary | ICD-10-CM | POA: Diagnosis not present

## 2015-11-09 DIAGNOSIS — R1111 Vomiting without nausea: Secondary | ICD-10-CM | POA: Diagnosis not present

## 2015-11-09 LAB — ECHOCARDIOGRAM COMPLETE
CHL CUP RV SYS PRESS: 30 mmHg
CHL CUP TV REG PEAK VELOCITY: 258 cm/s
E decel time: 246 msec
EERAT: 16.38
FS: 30 % (ref 28–44)
HEIGHTINCHES: 61 in
IVS/LV PW RATIO, ED: 1.74
LA ID, A-P, ES: 22 mm
LA diam index: 1.31 cm/m2
LA vol A4C: 18.8 ml
LA vol index: 15 mL/m2
LA vol: 25.2 mL
LDCA: 2.01 cm2
LEFT ATRIUM END SYS DIAM: 22 mm
LV TDI E'MEDIAL: 7.62
LVEEAVG: 16.38
LVEEMED: 16.38
LVELAT: 6.96 cm/s
LVOT VTI: 27.5 cm
LVOT diameter: 16 mm
LVOT peak grad rest: 7 mmHg
LVOTPV: 136 cm/s
LVOTSV: 55 mL
MV Dec: 246
MV pk A vel: 152 m/s
MV pk E vel: 114 m/s
MVPG: 5 mmHg
PW: 6.79 mm — AB (ref 0.6–1.1)
RV TAPSE: 17.5 mm
TDI e' lateral: 6.96
TR max vel: 258 cm/s
Weight: 2272 oz

## 2015-11-09 LAB — COMPREHENSIVE METABOLIC PANEL
ALT: 19 U/L (ref 14–54)
ANION GAP: 7 (ref 5–15)
AST: 32 U/L (ref 15–41)
Albumin: 3.9 g/dL (ref 3.5–5.0)
Alkaline Phosphatase: 71 U/L (ref 38–126)
BILIRUBIN TOTAL: 0.7 mg/dL (ref 0.3–1.2)
BUN: 16 mg/dL (ref 6–20)
CO2: 28 mmol/L (ref 22–32)
Calcium: 9.5 mg/dL (ref 8.9–10.3)
Chloride: 103 mmol/L (ref 101–111)
Creatinine, Ser: 0.88 mg/dL (ref 0.44–1.00)
GFR, EST NON AFRICAN AMERICAN: 60 mL/min — AB (ref >60)
Glucose, Bld: 127 mg/dL — ABNORMAL HIGH (ref 65–99)
POTASSIUM: 4.6 mmol/L (ref 3.5–5.1)
Sodium: 138 mmol/L (ref 135–145)
TOTAL PROTEIN: 7.8 g/dL (ref 6.5–8.1)

## 2015-11-09 LAB — CBC
HEMATOCRIT: 40.8 % (ref 36.0–46.0)
HEMOGLOBIN: 13.9 g/dL (ref 12.0–15.0)
MCH: 32.7 pg (ref 26.0–34.0)
MCHC: 34.1 g/dL (ref 30.0–36.0)
MCV: 96 fL (ref 78.0–100.0)
Platelets: 189 10*3/uL (ref 150–400)
RBC: 4.25 MIL/uL (ref 3.87–5.11)
RDW: 13.4 % (ref 11.5–15.5)
WBC: 6.2 10*3/uL (ref 4.0–10.5)

## 2015-11-09 LAB — I-STAT TROPONIN, ED: Troponin i, poc: 0.17 ng/mL (ref 0.00–0.08)

## 2015-11-09 LAB — URINALYSIS, ROUTINE W REFLEX MICROSCOPIC
BILIRUBIN URINE: NEGATIVE
Glucose, UA: NEGATIVE mg/dL
HGB URINE DIPSTICK: NEGATIVE
KETONES UR: NEGATIVE mg/dL
LEUKOCYTES UA: NEGATIVE
NITRITE: NEGATIVE
PH: 6.5 (ref 5.0–8.0)
Protein, ur: NEGATIVE mg/dL
Specific Gravity, Urine: 1.02 (ref 1.005–1.030)

## 2015-11-09 LAB — LIPASE, BLOOD: LIPASE: 24 U/L (ref 11–51)

## 2015-11-09 LAB — TROPONIN I
TROPONIN I: 0.05 ng/mL — AB (ref <0.03)
Troponin I: 0.2 ng/mL (ref <0.03)
Troponin I: 0.11 ng/mL (ref <0.03)

## 2015-11-09 MED ORDER — ACETAMINOPHEN 325 MG PO TABS
650.0000 mg | ORAL_TABLET | Freq: Four times a day (QID) | ORAL | Status: DC | PRN
Start: 2015-11-09 — End: 2015-11-10

## 2015-11-09 MED ORDER — ONDANSETRON HCL 4 MG/2ML IJ SOLN: 4.0000 mg | Freq: Four times a day (QID) | INTRAMUSCULAR | Status: DC | PRN

## 2015-11-09 MED ORDER — SODIUM CHLORIDE 0.9 % IV SOLN
INTRAVENOUS | Status: DC
  Administered 2015-11-09 – 2015-11-10 (×3): via INTRAVENOUS

## 2015-11-09 MED ORDER — ALBUTEROL SULFATE (2.5 MG/3ML) 0.083% IN NEBU
2.5000 mg | INHALATION_SOLUTION | Freq: Four times a day (QID) | RESPIRATORY_TRACT | Status: DC | PRN
Start: 2015-11-09 — End: 2015-11-10

## 2015-11-09 MED ORDER — PANTOPRAZOLE SODIUM 20 MG PO TBEC
20.0000 mg | DELAYED_RELEASE_TABLET | Freq: Every day | ORAL | Status: DC
  Administered 2015-11-10: 20 mg via ORAL
  Filled 2015-11-09 (×3): qty 1

## 2015-11-09 MED ORDER — ACETAMINOPHEN 650 MG RE SUPP: 650.0000 mg | Freq: Four times a day (QID) | RECTAL | Status: DC | PRN

## 2015-11-09 MED ORDER — LATANOPROST 0.005 % OP SOLN
1.0000 [drp] | Freq: Every day | OPHTHALMIC | Status: DC
  Administered 2015-11-09: 1 [drp] via OPHTHALMIC
  Filled 2015-11-09: qty 2.5

## 2015-11-09 MED ORDER — CALCIUM-MAGNESIUM-ZINC 333-133-8.3 MG PO TABS: 1.0000 | ORAL_TABLET | Freq: Two times a day (BID) | ORAL | Status: DC

## 2015-11-09 MED ORDER — ONDANSETRON HCL 4 MG PO TABS: 4.0000 mg | ORAL_TABLET | Freq: Four times a day (QID) | ORAL | Status: DC | PRN

## 2015-11-09 MED ORDER — FLUTICASONE PROPIONATE 50 MCG/ACT NA SUSP
2.0000 | Freq: Every day | NASAL | Status: DC
  Administered 2015-11-09 – 2015-11-10 (×2): 2 via NASAL
  Filled 2015-11-09: qty 16

## 2015-11-09 MED ORDER — ONDANSETRON HCL 4 MG/2ML IJ SOLN
4.0000 mg | Freq: Once | INTRAMUSCULAR | Status: AC
  Administered 2015-11-09: 4 mg via INTRAVENOUS
  Filled 2015-11-09: qty 2

## 2015-11-09 MED ORDER — NIACIN ER (ANTIHYPERLIPIDEMIC) 500 MG PO TBCR: 250.0000 mg | EXTENDED_RELEASE_TABLET | Freq: Two times a day (BID) | ORAL | Status: DC

## 2015-11-09 MED ORDER — NIACIN ER 250 MG PO CPCR
250.0000 mg | ORAL_CAPSULE | Freq: Every day | ORAL | Status: DC
  Administered 2015-11-10: 250 mg via ORAL
  Filled 2015-11-09: qty 1

## 2015-11-09 MED ORDER — ONDANSETRON 4 MG PO TBDP: 4.0000 mg | ORAL_TABLET | Freq: Once | ORAL | Status: DC | PRN

## 2015-11-09 MED ORDER — LORATADINE 10 MG PO TABS
10.0000 mg | ORAL_TABLET | Freq: Every day | ORAL | Status: DC
  Administered 2015-11-09 – 2015-11-10 (×2): 10 mg via ORAL
  Filled 2015-11-09 (×2): qty 1

## 2015-11-09 MED ORDER — METOPROLOL SUCCINATE ER 25 MG PO TB24
25.0000 mg | ORAL_TABLET | Freq: Every day | ORAL | Status: DC
  Administered 2015-11-10: 25 mg via ORAL
  Filled 2015-11-09 (×2): qty 1

## 2015-11-09 MED ORDER — OMEGA-3-ACID ETHYL ESTERS 1 G PO CAPS
1.0000 | ORAL_CAPSULE | Freq: Every day | ORAL | Status: DC
  Administered 2015-11-09 – 2015-11-10 (×2): 1 g via ORAL
  Filled 2015-11-09 (×3): qty 1

## 2015-11-09 MED ORDER — VITAMIN B-12 1000 MCG PO TABS
1000.0000 ug | ORAL_TABLET | Freq: Every day | ORAL | Status: DC
  Administered 2015-11-09 – 2015-11-10 (×2): 1000 ug via ORAL
  Filled 2015-11-09 (×2): qty 1

## 2015-11-09 MED ORDER — ENOXAPARIN SODIUM 40 MG/0.4ML ~~LOC~~ SOLN
40.0000 mg | SUBCUTANEOUS | Status: DC
  Filled 2015-11-09: qty 0.4

## 2015-11-09 MED ORDER — SODIUM CHLORIDE 0.9% FLUSH
3.0000 mL | Freq: Two times a day (BID) | INTRAVENOUS | Status: DC
Start: 2015-11-09 — End: 2015-11-10
  Administered 2015-11-09: 3 mL via INTRAVENOUS

## 2015-11-09 MED ORDER — SODIUM CHLORIDE 0.9 % IV BOLUS (SEPSIS)
500.0000 mL | Freq: Once | INTRAVENOUS | Status: AC
Start: 2015-11-09 — End: 2015-11-09
  Administered 2015-11-09: 500 mL via INTRAVENOUS

## 2015-11-09 MED ORDER — NIACIN ER 500 MG PO CPCR
500.0000 mg | ORAL_CAPSULE | Freq: Every day | ORAL | Status: DC
  Administered 2015-11-09: 500 mg via ORAL
  Filled 2015-11-09: qty 1

## 2015-11-09 NOTE — ED Notes (Signed)
Per EMS pt developed LUQ abdominal pain and had 1 episode of vomiting earlier last night. She called 911, but by the time they arrived she felt much better and declined transportation. Shortly afterwards, pain returned and she vomited again, so she called paramedics to bring her to the hospital. Pt sts after vomiting pain goes away.

## 2015-11-09 NOTE — ED Notes (Signed)
Pt's troponin elevated, RN and EDP Madilyn Hookees notified

## 2015-11-09 NOTE — ED Notes (Signed)
Delay in getting patient to floor due to Echocardiogram at bedside.  Floor was contacted, [place] to [place].

## 2015-11-09 NOTE — Progress Notes (Signed)
  Echocardiogram 2D Echocardiogram has been performed.  Leta JunglingCooper, Eliany Mccarter M 11/09/2015, 2:22 PM

## 2015-11-09 NOTE — ED Notes (Signed)
Informed MD of troponin result.  ( Dr. Madilyn Hookees)

## 2015-11-09 NOTE — ED Provider Notes (Signed)
CSN: [id number]     Arrival date & time 11/09/15  [id number] History   First MD Initiated Contact with Patient 11/09/15 [phone]     Chief Complaint  Patient presents with  . Abdominal Pain     Patient is a 82 y.o. female presenting with abdominal pain. The history is provided by the patient. No language interpreter was used.  Abdominal Pain  Brianna Mcdonald is a [age] y.o. female who presents to the Emergency Department complaining of abdominal pain, vomiting.  About 530 this morning she awoke with nausea, vomiting, abdominal pain.  Pain is central/upper abdomen and described as a cramping sensation.  She denies any fevers, chest pain, diarrhea, dysuria, bad food exposures.  She did not feel well yesterday and ate very little yesterday.  Her granddaughter, who lives with her has also been sick with nausea, headache, and abdominal pain (no vomiting).  Sxs are moderate, constant, worsening.    Past Medical History  Diagnosis Date  . Hypertension   . Allergy   . GERD (gastroesophageal reflux disease)   . Chest pain, unspecified   . Palpitations   . Edema   . Bronchitis, not specified as acute or chronic   . Irritable bowel syndrome   . Other specified disorders of liver   . Hyperlipidemia   . Diabetes mellitus    Past Surgical History  Procedure Laterality Date  . Appendectomy    . Wrist surgery    . Dilation and curettage of uterus      x 3   Family History  Problem Relation Age of Onset  . Asthma Mother   . Arthritis Mother     rheumatism  . Allergies Sister   . Drug abuse Other   . Allergies Other   . Arthritis Other    Social History  Substance Use Topics  . Smoking status: Never Smoker   . Smokeless tobacco: Never Used  . Alcohol Use: No   OB History    Gravida Para Term Preterm AB TAB SAB Ectopic Multiple Living   0 0 0 0 0 0 0 0 0 0      Review of Systems  Gastrointestinal: Positive for abdominal pain.  All other systems reviewed and are negative.     Allergies   Aspirin; Moxifloxacin; Penicillins; Propofol; Hydrochlorothiazide; Metformin and related; and Sulfonamide derivatives  Home Medications   Prior to Admission medications   Medication Sig Start Date End Date Taking? Authorizing Provider  albuterol (PROAIR HFA) 108 (90 BASE) MCG/ACT inhaler Inhale 1 puff into the lungs every 6 (six) hours as needed for wheezing or shortness of breath (or with excersize).   Yes Historical Provider, MD  CALCIUM-MAGNESUIUM-ZINC 333-133-8.3 MG TABS Take 1 tablet by mouth 2 (two) times daily.    Yes Historical Provider, MD  Cyanocobalamin (VITAMIN B-12) 1000 MCG SUBL Place 1 tablet under the tongue daily.    Yes Historical Provider, MD  desloratadine (CLARINEX) 5 MG tablet TAKE ONE TABLET BY MOUTH ONCE DAILY Patient taking differently: TAKE 5mg  by MOUTH ONCE DAILY 11/04/15  Yes Etta Grandchildhomas L Jones, MD  fish oil-omega-3 fatty acids 1000 MG capsule Take 1 capsule by mouth daily.    Yes Historical Provider, MD  fluticasone (FLONASE) 50 MCG/ACT nasal spray Place 2 sprays into both nostrils daily. Patient taking differently: Place 2 sprays into both nostrils 2 (two) times daily as needed for allergies.  08/24/15  Yes Etta Grandchildhomas L Jones, MD  lansoprazole (PREVACID) 30 MG capsule TAKE ONE CAPSULE BY  MOUTH ONCE DAILY Patient taking differently: TAKE 30mg  BY MOUTH ONCE DAILY 05/19/15  Yes Etta Grandchild, MD  latanoprost (XALATAN) 0.005 % ophthalmic solution Place 1 drop into both eyes at bedtime.  05/28/15 05/27/16 Yes Historical Provider, MD  metoprolol succinate (TOPROL-XL) 25 MG 24 hr tablet TAKE ONE TABLET BY MOUTH ONCE DAILY Patient taking differently: TAKE 25mg  BY MOUTH ONCE DAILY 03/13/15  Yes Etta Grandchild, MD  niacin (NIASPAN) 500 MG CR tablet Take 250-500 mg by mouth 2 (two) times daily. Take 250 mg in the am and 500 mg at bedtime   Yes Historical Provider, MD  selenium 50 MCG TABS tablet Take 50 mcg by mouth daily.    Yes Historical Provider, MD   BP 112/58 mmHg  Pulse 56   Temp(Src) 98.7 F (37.1 C) (Oral)  Resp 15  Ht 5\' 1"  (1.549 m)  Wt 142 lb (64.411 kg)  BMI 26.84 kg/m2  SpO2 98% Physical Exam  Constitutional: She is oriented to person, place, and time. She appears well-developed and well-nourished.  HENT:  Head: Normocephalic and atraumatic.  Cardiovascular: Normal rate and regular rhythm.   No murmur heard. Pulmonary/Chest: Effort normal and breath sounds normal. No respiratory distress.  Abdominal: Soft.  Moderate LLQ tenderness, mild upper abdominal tenderness  Musculoskeletal: She exhibits no edema or tenderness.  Neurological: She is alert and oriented to person, place, and time.  Skin: Skin is warm and dry.  Psychiatric: She has a normal mood and affect. Her behavior is normal.  Nursing note and vitals reviewed.   ED Course  Procedures (including critical care time) Labs Review Labs Reviewed  COMPREHENSIVE METABOLIC PANEL - Abnormal; Notable for the following:    Glucose, Bld 127 (*)    GFR calc non Af Amer 60 (*)    All other components within normal limits  URINALYSIS, ROUTINE W REFLEX MICROSCOPIC (NOT AT [facility]) - Abnormal; Notable for the following:    APPearance CLOUDY (*)    All other components within normal limits  TROPONIN I - Abnormal; Notable for the following:    Troponin I 0.20 (*)    All other components within normal limits  I-STAT TROPOININ, ED - Abnormal; Notable for the following:    Troponin i, poc 0.17 (*)    All other components within normal limits  LIPASE, BLOOD  CBC  TROPONIN I  TROPONIN I  TROPONIN I    Imaging Review Ct Abdomen Pelvis Wo Contrast  11/09/2015  CLINICAL DATA:  Left upper quadrant abdominal pain. One episode of vomiting last night. EXAM: CT ABDOMEN AND PELVIS WITHOUT CONTRAST TECHNIQUE: Multidetector CT imaging of the abdomen and pelvis was performed following the standard protocol without IV contrast. COMPARISON:  06/10/2007. FINDINGS: Lower chest:  Mild bilateral dependent atelectasis/  scarring. Hepatobiliary: Little change in previously demonstrated liver cysts. Normal appearing gallbladder. Pancreas: No mass or inflammatory process identified on this un-enhanced exam. Spleen: Within normal limits in size and appearance. Adrenals/Urinary Tract: Normal appearing adrenal glands, kidneys, ureters and urinary bladder. No calculi or hydronephrosis. No visible mass. Stomach/Bowel: Mild sigmoid colon diverticulosis. No gastric or small bowel abnormalities. Surgically absent appendix. Vascular/Lymphatic: No pathologically enlarged lymph nodes. No evidence of abdominal aortic aneurysm. Reproductive: Small calcified uterine fibroid.  No adnexal mass. Other: None. Musculoskeletal: Lumbar and lower thoracic spine degenerative changes. IMPRESSION: No acute abnormality. Electronically Signed   By: Beckie Salts M.D.   On: 11/09/2015 09:38   Dg Chest 2 View  11/09/2015  CLINICAL DATA:  82 year old female with a history of left upper quadrant pain EXAM: CHEST  2 VIEW COMPARISON:  04/02/2013, 12/01/2007 FINDINGS: Cardiomediastinal silhouette unchanged in size and contour. No confluent airspace disease, pneumothorax, or pleural effusion. Unremarkable appearance of the upper abdomen. IMPRESSION: No radiographic evidence of acute cardiopulmonary disease, background chronic changes. Aortic atherosclerosis. Signed, Yvone Neu. Loreta Ave, DO [facility] Radiology Electronically Signed   By: Gilmer Mor D.O.   On: 11/09/2015 09:31   I have personally reviewed and evaluated these images and lab results as part of my medical decision-making.   EKG Interpretation   Date/Time:  Tuesday November 09 2015 07:30:15 EDT Ventricular Rate:  59 PR Interval:    QRS Duration: 89 QT Interval:  417 QTC Calculation: 414 R Axis:   13 Text Interpretation:  Sinus rhythm Consider left atrial enlargement  Abnormal R-wave progression, early transition Left ventricular hypertrophy   Confirmed by Lincoln Brigham [phone]) on 11/09/2015 7:36:31 AM Also confirmed by  Lincoln Brigham [phone]), editor WALKER, CCT, SANDRA ([id number])  on 11/09/2015  8:39:17 AM      MDM   Final diagnoses:  Non-intractable vomiting with nausea, vomiting of unspecified type  Elevated troponin    Patient here for evaluation of vomiting and crampy upper abdominal pain. Troponin obtained given her epigastric pain and vomiting, it is elevated and of unclear significance. EKG with no ischemic changes and on repeat assessment patient's symptoms have improved and cardiology consulted regarding elevated troponin. Hospitalist consulted for admission for monitoring.  Tilden Fossa, MD 11/09/15 [phone]

## 2015-11-09 NOTE — Progress Notes (Signed)
VASCULAR LAB PRELIMINARY  PRELIMINARY  PRELIMINARY  PRELIMINARY  Bilateral lower extremity venous duplex has been completed.     Bilateral:  No evidence of DVT, superficial thrombosis, or Baker's Cyst.   Jenetta Logesami Amdrew Oboyle, RVT, RDMS 11/09/2015, 3:43 PM

## 2015-11-09 NOTE — ED Notes (Signed)
Patient transported to X-ray 

## 2015-11-09 NOTE — Consult Note (Signed)
Patient ID: Brianna Mcdonald MRN: [id number], DOB/AGE: [birth year]   Reason for Consult: Abdominal Pain + abnormal troponin  Requesting MD: Dr. Kathe Becton ED Physician   Admit date: 11/09/2015  Primary Physician: Sanda Linger, MD Primary Cardiologist: Dr. Antoine Poche (last seen in clinic in 2015)  Pt. Profile:  82 y/o female with h/o HTN, DM, HLD, GERD, palpitations and IBS presenting to the [facility] ED with complaint of abdominal pain + elevated troponin.   Problem List  Past Medical History  Diagnosis Date  . Hypertension   . Allergy   . GERD (gastroesophageal reflux disease)   . Chest pain, unspecified   . Palpitations   . Edema   . Bronchitis, not specified as acute or chronic   . Irritable bowel syndrome   . Other specified disorders of liver   . Hyperlipidemia   . Diabetes mellitus     Past Surgical History  Procedure Laterality Date  . Appendectomy    . Wrist surgery    . Dilation and curettage of uterus      x 3     Allergies  Allergies  Allergen Reactions  . Aspirin Shortness Of Breath  . Moxifloxacin Shortness Of Breath  . Penicillins Shortness Of Breath and Itching    Did PCN reaction causing immediate rash, facial/tongue/throat swelling, SOB or lightheadedness with hypotension: yes Did PCN reaction causing severe rash involving mucus membranes or skin necrosis: no Did PCN reaction that required hospitalization: was already prepared for allergic reaction, did not have to go to hospital Did PCN reaction occurring within the last 10 years: no If all of the above answers are "NO", then may proceed with Cephalosporin use.   Marland Kitchen Propofol Anaphylaxis    Stopped breathing  . Hydrochlorothiazide Nausea And Vomiting  . Metformin And Related     Leg and abdominal cramps  . Sulfonamide Derivatives Other (See Comments)    unknown    HPI  82 y/o female with h/o HTN, DM, HLD, GERD, palpitations and IBS presenting to the [facility] ED with complaint of abdominal pain +  elevated troponin. She was last seen in clinic in 2015 by Wilburt Finlay, PA-C. She was seen for atypical CP that had occurred 1 week prior to her appointment w/o further recurrence. Based on description if was felt to be gas related pains (sharp LLQ pain radiating up to her axilla). Symptoms relieved with Gas-X. Given no recurrence, no cardiac w/u was pursued. She has not been seen in clinic since that time. She has no significant cardiac history. She was seen by Dr. [place] Lions in the past for palpitations but w/u was unrevealing.   She now presents to [facility] with complaint of periumbilical +LUQ pain + vomiting x 2. Symptoms occurred earlier this am. Pain in her abdomin was sharp. Also felt like she had a have a BM but could not. She denies any chest/ mid epigastric pain. No dyspnea. She is currently pain free. POC troponin in the ED abnormal at 0.17. Lipase negative. CBC and BMP both unremarkable. CT of abdomen and pelvis shows no acute abnormality. CXR unremarkable. EKG shows NSR with poor Rwave progression.   Home Medications  Prior to Admission medications   Medication Sig Start Date End Date Taking? Authorizing Provider  albuterol (PROAIR HFA) 108 (90 BASE) MCG/ACT inhaler Inhale 1 puff into the lungs every 6 (six) hours as needed for wheezing or shortness of breath (or with excersize).   Yes Historical Provider, MD  CALCIUM-MAGNESUIUM-ZINC 333-133-8.3 MG  TABS Take 1 tablet by mouth 2 (two) times daily.    Yes Historical Provider, MD  Cyanocobalamin (VITAMIN B-12) 1000 MCG SUBL Place 1 tablet under the tongue daily.    Yes Historical Provider, MD  desloratadine (CLARINEX) 5 MG tablet TAKE ONE TABLET BY MOUTH ONCE DAILY Patient taking differently: TAKE  by MOUTH ONCE DAILY 11/04/15  Yes Etta Grandchild, MD  fish oil-omega-3 fatty acids 1000 MG capsule Take 1 capsule by mouth daily.    Yes Historical Provider, MD  fluticasone (FLONASE) 50 MCG/ACT nasal spray Place 2 sprays into both nostrils  daily. Patient taking differently: Place 2 sprays into both nostrils 2 (two) times daily as needed for allergies.  08/24/15  Yes Etta Grandchild, MD  lansoprazole (PREVACID) 30 MG capsule TAKE ONE CAPSULE BY MOUTH ONCE DAILY Patient taking differently: TAKE  BY MOUTH ONCE DAILY 05/19/15  Yes Etta Grandchild, MD  latanoprost (XALATAN) 0.005 % ophthalmic solution Place 1 drop into both eyes at bedtime.  05/28/15 05/27/16 Yes Historical Provider, MD  metoprolol succinate (TOPROL-XL) 25 MG 24 hr tablet TAKE ONE TABLET BY MOUTH ONCE DAILY Patient taking differently: TAKE  BY MOUTH ONCE DAILY 03/13/15  Yes Etta Grandchild, MD  niacin (NIASPAN) 500 MG CR tablet Take 250-500 mg by mouth 2 (two) times daily. Take 250 mg in the am and 500 mg at bedtime   Yes Historical Provider, MD  selenium 50 MCG TABS tablet Take 50 mcg by mouth daily.    Yes Historical Provider, MD    Family History  Family History  Problem Relation Age of Onset  . Asthma Mother   . Arthritis Mother     rheumatism  . Allergies Sister   . Drug abuse Other   . Allergies Other   . Arthritis Other     Social History  Social History   Social History  . Marital Status: Widowed    Spouse Name: N/A  . Number of Children: N/A  . Years of Education: N/A   Occupational History  . retired    Social History Main Topics  . Smoking status: Never Smoker   . Smokeless tobacco: Never Used  . Alcohol Use: No  . Drug Use: No  . Sexual Activity: Not Currently    Birth Control/ Protection: Post-menopausal   Other Topics Concern  . Not on file   Social History Narrative   Adopted 1 daughter   Lives alone   Widowed   retired     Review of Systems General:  No chills, fever, night sweats or weight changes.  Cardiovascular:  No chest pain, dyspnea on exertion, edema, orthopnea, palpitations, paroxysmal nocturnal dyspnea. Dermatological: No rash, lesions/masses Respiratory: No cough, dyspnea Urologic: No hematuria,  dysuria Abdominal:   No nausea, vomiting, diarrhea, bright red blood per rectum, melena, or hematemesis Neurologic:  No visual changes, wkns, changes in mental status. All other systems reviewed and are otherwise negative except as noted above.  Physical Exam  Blood pressure 124/52, pulse 53, temperature 98.1 F (36.7 C), temperature source Oral, resp. rate 11, height  (1.549 m), weight 142 lb (64.411 kg), SpO2 100 %.  General: Pleasant, NAD, appears younger than age, dentition in poor repair Psych: Normal affect. Neuro: Alert and oriented X 3. Moves all extremities spontaneously. HEENT: Normal  Neck: Supple without bruits or JVD. Lungs:  Resp regular and unlabored, CTA. Heart: RRR no s3, s4, or murmurs. Abdomen: Soft, non-tender, non-distended, BS + x 4.  Extremities:  No clubbing, cyanosis or edema. DP/PT/Radials 2+ and equal bilaterally.  Labs  Troponin [facility](Point of Care Test)  Recent Labs  11/09/15 0755  TROPIPOC 0.17*   No results for input(s): CKTOTAL, CKMB, TROPONINI in the last 72 hours. Lab Results  Component Value Date   WBC 6.2 11/09/2015   HGB 13.9 11/09/2015   HCT 40.8 11/09/2015   MCV 96.0 11/09/2015   PLT 189 11/09/2015    Recent Labs Lab 11/09/15 0748  NA 138  K 4.6  CL 103  CO2 28  BUN 16  CREATININE 0.88  CALCIUM 9.5  PROT 7.8  BILITOT 0.7  ALKPHOS 71  ALT 19  AST 32  GLUCOSE 127*   Lab Results  Component Value Date   CHOL 214* 03/09/2015   HDL 69.00 03/09/2015   LDLCALC 126* 03/09/2015   TRIG 93.0 03/09/2015   No results found for: DDIMER   Radiology/Studies  Ct Abdomen Pelvis Wo Contrast  11/09/2015  CLINICAL DATA:  Left upper quadrant abdominal pain. One episode of vomiting last night. EXAM: CT ABDOMEN AND PELVIS WITHOUT CONTRAST TECHNIQUE: Multidetector CT imaging of the abdomen and pelvis was performed following the standard protocol without IV contrast. COMPARISON:  06/10/2007. FINDINGS: Lower chest:  Mild bilateral dependent  atelectasis/ scarring. Hepatobiliary: Little change in previously demonstrated liver cysts. Normal appearing gallbladder. Pancreas: No mass or inflammatory process identified on this un-enhanced exam. Spleen: Within normal limits in size and appearance. Adrenals/Urinary Tract: Normal appearing adrenal glands, kidneys, ureters and urinary bladder. No calculi or hydronephrosis. No visible mass. Stomach/Bowel: Mild sigmoid colon diverticulosis. No gastric or small bowel abnormalities. Surgically absent appendix. Vascular/Lymphatic: No pathologically enlarged lymph nodes. No evidence of abdominal aortic aneurysm. Reproductive: Small calcified uterine fibroid.  No adnexal mass. Other: None. Musculoskeletal: Lumbar and lower thoracic spine degenerative changes. IMPRESSION: No acute abnormality. Electronically Signed   By: Beckie SaltsSteven  Reid M.D.   On: 11/09/2015 09:38   Dg Chest 2 View  11/09/2015  CLINICAL DATA:  82 year old female with a history of left upper quadrant pain EXAM: CHEST  2 VIEW COMPARISON:  04/02/2013, 12/01/2007 FINDINGS: Cardiomediastinal silhouette unchanged in size and contour. No confluent airspace disease, pneumothorax, or pleural effusion. Unremarkable appearance of the upper abdomen. IMPRESSION: No radiographic evidence of acute cardiopulmonary disease, background chronic changes. Aortic atherosclerosis. Signed, Yvone NeuJaime S. Loreta AveWagner, DO [facility] Electronically Signed   By: Gilmer MorJaime  Wagner D.O.   On: 11/09/2015 09:31    ECG  NSR. No ST abnormalities    ASSESSMENT AND PLAN  1. Abdominal Pain + Abnormal troponin: symptoms are atypical however she is elderly, female and diabetic. She describes periumbilical/ LUQ pain + emesis x 2, however Lipase is normal and CT of abdomen and pelvis unremarkable. POC troponin in the ED is abnormal at 0.17. She denies CP. EKG without ST elevations/depressions. She is currently pain free and abdominal exam  is unremarkable. No tenderness with palpation. Recommend admitting for observation to cycle troponin I labs x 3 to assess trend. Check 2D echo to assess LVF/ wall motion. Further cardiac w/u dependent on enzyme trend and echo findings.    Signed, Robbie LisBrittainy Simmons, PA-C 11/09/2015, 10:56 AM

## 2015-11-09 NOTE — H&P (Addendum)
History and Physical    Brianna ColtRuby M Ishee QIO:[id number] DOB: [birth year] DOA: 11/09/2015  PCP: Sanda Lingerhomas Jones, MD  Patient coming from: Home   Chief Complaint: Nausea, vomiting.   HPI: Brianna Mcdonald is a 82 y.o. female with medical history significant of HTN,DM on diet controlled, who presents complaining of nausea, vomiting and cramping abdominal pain that started this morning. She had multiples episodes for that reason she had to call EMS. She is feeling better now. She denies chest pain or dyspnea. Her granddaughter has been also sick. Patient denies hematemesis, or coffee ground emesis.  She also is complaining of pain in her bilateral LE,calf area.   ED Course: Evaluation in the ED: CT abdomen pelvis with no acute abnormalities. LFT, Lipase and electrolytes were within normal range. Troponin mildly elevated at 0.17---0.20. Cardiology consulted in the ED, who recommend admission for observation and ECHO.   Review of Systems: As per HPI otherwise 10 point review of systems negative.    Past Medical History  Diagnosis Date  . Hypertension   . Allergy   . GERD (gastroesophageal reflux disease)   . Chest pain, unspecified   . Palpitations   . Edema   . Bronchitis, not specified as acute or chronic   . Irritable bowel syndrome   . Other specified disorders of liver   . Hyperlipidemia   . Diabetes mellitus     Past Surgical History  Procedure Laterality Date  . Appendectomy    . Wrist surgery    . Dilation and curettage of uterus      x 3   Social history;   reports that she has never smoked. She has never used smokeless tobacco. She reports that she does not drink alcohol or use illicit drugs.  Allergies  Allergen Reactions  . Aspirin Shortness Of Breath  . Moxifloxacin Shortness Of Breath  . Penicillins Shortness Of Breath and Itching    Did PCN reaction causing immediate rash, facial/tongue/throat swelling, SOB or lightheadedness with hypotension: yes Did PCN  reaction causing severe rash involving mucus membranes or skin necrosis: no Did PCN reaction that required hospitalization: was already prepared for allergic reaction, did not have to go to hospital Did PCN reaction occurring within the last 10 years: no If all of the above answers are "NO", then may proceed with Cephalosporin use.   Marland Kitchen. Propofol Anaphylaxis    Stopped breathing  . Hydrochlorothiazide Nausea And Vomiting  . Metformin And Related     Leg and abdominal cramps  . Sulfonamide Derivatives Other (See Comments)    unknown    Family History  Problem Relation Age of Onset  . Asthma Mother   . Arthritis Mother     rheumatism  . Allergies Sister   . Drug abuse Other   . Allergies Other   . Arthritis Other      Prior to Admission medications   Medication Sig Start Date End Date Taking? Authorizing Provider  albuterol (PROAIR HFA) 108 (90 BASE) MCG/ACT inhaler Inhale 1 puff into the lungs every 6 (six) hours as needed for wheezing or shortness of breath (or with excersize).   Yes Historical Provider, MD  CALCIUM-MAGNESUIUM-ZINC 333-133-8.3 MG TABS Take 1 tablet by mouth 2 (two) times daily.    Yes Historical Provider, MD  Cyanocobalamin (VITAMIN B-12) 1000 MCG SUBL Place 1 tablet under the tongue daily.    Yes Historical Provider, MD  desloratadine (CLARINEX) 5 MG tablet TAKE ONE TABLET BY MOUTH ONCE DAILY Patient taking  differently: TAKE  by MOUTH ONCE DAILY 11/04/15  Yes Etta Grandchild, MD  fish oil-omega-3 fatty acids 1000 MG capsule Take 1 capsule by mouth daily.    Yes Historical Provider, MD  fluticasone (FLONASE) 50 MCG/ACT nasal spray Place 2 sprays into both nostrils daily. Patient taking differently: Place 2 sprays into both nostrils 2 (two) times daily as needed for allergies.  08/24/15  Yes Etta Grandchild, MD  lansoprazole (PREVACID) 30 MG capsule TAKE ONE CAPSULE BY MOUTH ONCE DAILY Patient taking differently: TAKE  BY MOUTH ONCE DAILY 05/19/15  Yes Etta Grandchild, MD  latanoprost (XALATAN) 0.005 % ophthalmic solution Place 1 drop into both eyes at bedtime.  05/28/15 05/27/16 Yes Historical Provider, MD  metoprolol succinate (TOPROL-XL) 25 MG 24 hr tablet TAKE ONE TABLET BY MOUTH ONCE DAILY Patient taking differently: TAKE  BY MOUTH ONCE DAILY 03/13/15  Yes Etta Grandchild, MD  niacin (NIASPAN) 500 MG CR tablet Take 250-500 mg by mouth 2 (two) times daily. Take 250 mg in the am and 500 mg at bedtime   Yes Historical Provider, MD  selenium 50 MCG TABS tablet Take 50 mcg by mouth daily.    Yes Historical Provider, MD    Physical Exam: Filed Vitals:   11/09/15 0824 11/09/15 1019 11/09/15 1146 11/09/15 1428  BP: 132/63 124/52 117/61 112/58  Pulse: 57 53 55 56  Temp: 97.9 F (36.6 C) 98.1 F (36.7 C) 98.4 F (36.9 C) 98.7 F (37.1 C)  TempSrc: Oral Oral Oral Oral  Resp: Height:      Weight:      SpO2: 100% 100% 99% 98%      Constitutional: NAD, calm, comfortable, thin appearing  Filed Vitals:   11/09/15 0824 11/09/15 1019 11/09/15 1146 11/09/15 1428  BP: 132/63 124/52 117/61 112/58  Pulse: 57 53 55 56  Temp: 97.9 F (36.6 C) 98.1 F (36.7 C) 98.4 F (36.9 C) 98.7 F (37.1 C)  TempSrc: Oral Oral Oral Oral  Resp: Height:      Weight:      SpO2: 100% 100% 99% 98%   Eyes: PERRL, lids and conjunctivae normal ENMT: Mucous membranes are moist. Posterior pharynx clear of any exudate or lesions. Poor dentition.  Neck: normal, supple, no masses, no thyromegaly Respiratory: clear to auscultation bilaterally, no wheezing, no crackles. Normal respiratory effort. No accessory muscle use.  Cardiovascular: Regular rate and rhythm, no murmurs / rubs / gallops. No extremity edema. 2+ pedal pulses. No carotid bruits.  Abdomen: no tenderness, no masses palpated. No hepatosplenomegaly. Bowel sounds positive.  Musculoskeletal: no clubbing / cyanosis. No joint deformity upper and lower extremities. Good ROM, no  contractures. Normal muscle tone.  Skin: no rashes, lesions, ulcers. No induration Neurologic: CN 2-12 grossly intact. Sensation intact, DTR normal. Strength 5/5 in all 4.  Psychiatric: Normal judgment and insight. Alert and oriented x 3. Normal mood.     Labs on Admission: I have personally reviewed following labs and imaging studies  CBC:  Recent Labs Lab 11/09/15 0748  WBC 6.2  HGB 13.9  HCT 40.8  MCV 96.0  PLT 189   Basic Metabolic Panel:  Recent Labs Lab 11/09/15 0748  NA 138  K 4.6  CL 103  CO2 28  GLUCOSE 127*  BUN 16  CREATININE 0.88  CALCIUM 9.5   GFR: Estimated Creatinine Clearance: 43.1 mL/min (by C-G formula based on Cr of 0.88). Liver Function Tests:  Recent Labs Lab 11/09/15 0748  AST 32  ALT 19  ALKPHOS 71  BILITOT 0.7  PROT 7.8  ALBUMIN 3.9    Recent Labs Lab 11/09/15 0748  LIPASE 24   No results for input(s): AMMONIA in the last 168 hours. Coagulation Profile: No results for input(s): INR, PROTIME in the last 168 hours. Cardiac Enzymes:  Recent Labs Lab 11/09/15 1136  TROPONINI 0.20*   BNP (last 3 results) No results for input(s): PROBNP in the last 8760 hours. HbA1C: No results for input(s): HGBA1C in the last 72 hours. CBG: No results for input(s): GLUCAP in the last 168 hours. Lipid Profile: No results for input(s): CHOL, HDL, LDLCALC, TRIG, CHOLHDL, LDLDIRECT in the last 72 hours. Thyroid Function Tests: No results for input(s): TSH, T4TOTAL, FREET4, T3FREE, THYROIDAB in the last 72 hours. Anemia Panel: No results for input(s): VITAMINB12, FOLATE, FERRITIN, TIBC, IRON, RETICCTPCT in the last 72 hours. Urine analysis:    Component Value Date/Time   COLORURINE YELLOW 11/09/2015 0823   APPEARANCEUR CLOUDY* 11/09/2015 0823   LABSPEC 1.020 11/09/2015 0823   PHURINE 6.5 11/09/2015 0823   GLUCOSEU NEGATIVE 11/09/2015 0823   GLUCOSEU NEGATIVE 03/09/2015 0842   HGBUR NEGATIVE 11/09/2015 0823   BILIRUBINUR NEGATIVE  11/09/2015 0823   BILIRUBINUR neg 07/01/2012 1135   KETONESUR NEGATIVE 11/09/2015 0823   PROTEINUR NEGATIVE 11/09/2015 0823   PROTEINUR trace 07/01/2012 1135   UROBILINOGEN 0.2 03/09/2015 0842   UROBILINOGEN negative 07/01/2012 1135   NITRITE NEGATIVE 11/09/2015 0823   NITRITE neg 07/01/2012 1135   LEUKOCYTESUR NEGATIVE 11/09/2015 0823   Sepsis Labs: !!!!!!!!!!!!!!!!!!!!!!!!!!!!!!!!!!!!!!!!!!!! @LABRCNTIP (procalcitonin:4,lacticidven:4) )No results found for this or any previous visit (from the past 240 hour(s)).   Radiological Exams on Admission: Ct Abdomen Pelvis Wo Contrast  11/09/2015  CLINICAL DATA:  Left upper quadrant abdominal pain. One episode of vomiting last night. EXAM: CT ABDOMEN AND PELVIS WITHOUT CONTRAST TECHNIQUE: Multidetector CT imaging of the abdomen and pelvis was performed following the standard protocol without IV contrast. COMPARISON:  06/10/2007. FINDINGS: Lower chest:  Mild bilateral dependent atelectasis/ scarring. Hepatobiliary: Little change in previously demonstrated liver cysts. Normal appearing gallbladder. Pancreas: No mass or inflammatory process identified on this un-enhanced exam. Spleen: Within normal limits in size and appearance. Adrenals/Urinary Tract: Normal appearing adrenal glands, kidneys, ureters and urinary bladder. No calculi or hydronephrosis. No visible mass. Stomach/Bowel: Mild sigmoid colon diverticulosis. No gastric or small bowel abnormalities. Surgically absent appendix. Vascular/Lymphatic: No pathologically enlarged lymph nodes. No evidence of abdominal aortic aneurysm. Reproductive: Small calcified uterine fibroid.  No adnexal mass. Other: None. Musculoskeletal: Lumbar and lower thoracic spine degenerative changes. IMPRESSION: No acute abnormality. Electronically Signed   By: Beckie Salts M.D.   On: 11/09/2015 09:38   Dg Chest 2 View  11/09/2015  CLINICAL DATA:  82 year old female with a history of left upper quadrant pain EXAM: CHEST  2 VIEW  COMPARISON:  04/02/2013, 12/01/2007 FINDINGS: Cardiomediastinal silhouette unchanged in size and contour. No confluent airspace disease, pneumothorax, or pleural effusion. Unremarkable appearance of the upper abdomen. IMPRESSION: No radiographic evidence of acute cardiopulmonary disease, background chronic changes. Aortic atherosclerosis. Signed, Yvone Neu. Loreta Ave, DO [facility] Electronically Signed   By: Gilmer Mor D.O.   On: 11/09/2015 09:31    EKG: Independently reviewed. Sinus no ST elevation.   Assessment/Plan Principal Problem:   Gastroenteritis Active Problems:   Essential hypertension, benign   Type II diabetes mellitus with manifestations (HCC)   Elevated troponin  1-Gastroenteritis;  Suspect viral. Patient presents with nausea, vomiting, abdominal pain.  CT scan and labs normal.  Supportive care.  Full liquid diet,advance as tolerates.  IV fluids,Zofran PRN.   2-Mild elevated troponin.  No chest pain, EKG sinus.  Follow enzymes trend.  ECHO ordered.  Cardiology was consulted by ED>   3-Bilateral Lower extremities pain; check doppler rule out DVT   4-HTN; continue with metoprolol.   DVT prophylaxis: lovenox.  Code Status: patient wishes to be full code.  Family Communication:care discussed with patient and grandaughter  Disposition Plan: Home in 24 to 48 hours. Consults called:cardiology byED Admission status: observation,telemetry    Hartley Barefoot A MD [facility] Pager [phone]  If 7PM-7AM, please contact night-coverage [email or web address] Password [email or web address]  11/09/2015, 2:45 PM

## 2015-11-10 DIAGNOSIS — K529 Noninfective gastroenteritis and colitis, unspecified: Secondary | ICD-10-CM | POA: Diagnosis not present

## 2015-11-10 DIAGNOSIS — I10 Essential (primary) hypertension: Secondary | ICD-10-CM | POA: Diagnosis not present

## 2015-11-10 DIAGNOSIS — R112 Nausea with vomiting, unspecified: Secondary | ICD-10-CM | POA: Diagnosis not present

## 2015-11-10 DIAGNOSIS — A084 Viral intestinal infection, unspecified: Secondary | ICD-10-CM | POA: Diagnosis not present

## 2015-11-10 DIAGNOSIS — R7989 Other specified abnormal findings of blood chemistry: Secondary | ICD-10-CM | POA: Diagnosis not present

## 2015-11-10 DIAGNOSIS — E118 Type 2 diabetes mellitus with unspecified complications: Secondary | ICD-10-CM

## 2015-11-10 LAB — TROPONIN I: TROPONIN I: 0.03 ng/mL — AB (ref <0.03)

## 2015-11-10 LAB — CBC
HCT: 36.3 % (ref 36.0–46.0)
HEMOGLOBIN: 12.2 g/dL (ref 12.0–15.0)
MCH: 32.4 pg (ref 26.0–34.0)
MCHC: 33.6 g/dL (ref 30.0–36.0)
MCV: 96.5 fL (ref 78.0–100.0)
PLATELETS: 155 10*3/uL (ref 150–400)
RBC: 3.76 MIL/uL — AB (ref 3.87–5.11)
RDW: 13.4 % (ref 11.5–15.5)
WBC: 3.3 10*3/uL — ABNORMAL LOW (ref 4.0–10.5)

## 2015-11-10 LAB — BASIC METABOLIC PANEL
ANION GAP: 7 (ref 5–15)
BUN: 11 mg/dL (ref 6–20)
CALCIUM: 8.3 mg/dL — AB (ref 8.9–10.3)
CO2: 23 mmol/L (ref 22–32)
Chloride: 108 mmol/L (ref 101–111)
Creatinine, Ser: 0.77 mg/dL (ref 0.44–1.00)
Glucose, Bld: 89 mg/dL (ref 65–99)
Potassium: 3.9 mmol/L (ref 3.5–5.1)
SODIUM: 138 mmol/L (ref 135–145)

## 2015-11-10 NOTE — Discharge Summary (Signed)
Brianna Mcdonald ZOX:[id number] DOB: 30-Oct-1932 DOA: 11/09/2015  PCP: Sanda Linger, MD  Admit date: 11/09/2015  Discharge date: 11/10/2015  Admitted From: Home   Disposition:  Home   Recommendations for Outpatient Follow-up:   Follow up with PCP in 1-2 weeks  PCP Please obtain BMP/CBC, 2 view CXR in 1week,  (see Discharge instructions)   PCP Please follow up on the following pending results: None   Home Health: None  Equipment/Devices: None  Discharge Condition: Stable    CODE STATUS: Full   Diet Recommendation: Heart healthy Consultations: Cardiology  Chief Complaint  Patient presents with  . Abdominal Pain     Brief history of present illness from the day of admission and additional interim summary    Brianna Mcdonald is a 82 y.o. female with medical history significant of HTN,DM on diet controlled, who presents complaining of nausea, vomiting and cramping abdominal pain that started this morning. She had multiples episodes for that reason she had to call EMS. She is feeling better now. She denies chest pain or dyspnea. Her granddaughter has been also sick. Patient denies hematemesis, or coffee ground emesis.  She also is complaining of pain in her bilateral LE,calf area.   ED Course: Evaluation in the ED: CT abdomen pelvis with no acute abnormalities. LFT, Lipase and electrolytes were within normal range. Troponin mildly elevated at 0.17---0.20. Cardiology consulted in the ED, who recommend admission for observation and ECHO.  Hospital issues addressed    1.Viral gastroenteritis. Full he resolved, was hydrated with IV fluids, no further bowel movements in 24 hours, symptom free and tolerating diet.  2. Incidental finding of mildly elevated troponin and non-ACS pattern. Stable EKG, was seen by  cardiology, never had chest pain, echocardiogram stable with preserved EF of 60% without any wall motion abnormality, no further workup. This was likely demand ischemia from dehydration.  3. Bilateral lower extremity pain. Likely cramping from dehydration, ruled out for DVT with ultrasound.  4. Essential hypertension. Stable on home dose better blocker continue.   Discharge diagnosis     Principal Problem:   Gastroenteritis Active Problems:   Essential hypertension, benign   Type II diabetes mellitus with manifestations (HCC)   Elevated troponin    Discharge instructions    Discharge Instructions    Diet - low sodium heart healthy    Complete by:  As directed      Discharge instructions    Complete by:  As directed   Follow with Primary MD Sanda Linger, MD in 7 days   Get CBC, CMP, 2 view Chest X ray checked  by Primary MD or SNF MD in 5-7 days ( we routinely change or add medications that can affect your baseline labs and fluid status, therefore we recommend that you get the mentioned basic workup next visit with your PCP, your PCP may decide not to get them or add new tests based on their clinical decision)   Activity: As tolerated with Full fall precautions use walker/cane & assistance as needed  Disposition Home    Diet:   Heart Healthy    For Heart failure patients - Check your Weight same time everyday, if you gain over 2 pounds, or you develop in leg swelling, experience more shortness of breath or chest pain, call your Primary MD immediately. Follow Cardiac Low Salt Diet and 1.5 lit/day fluid restriction.   On your next visit with your primary care physician please Get Medicines reviewed and adjusted.   Please request your Prim.MD to go over all Hospital Tests and Procedure/Radiological results at the follow up, please get all Hospital records sent to your Prim MD by signing hospital release before you go home.   If you experience worsening of your admission  symptoms, develop shortness of breath, life threatening emergency, suicidal or homicidal thoughts you must seek medical attention immediately by calling 911 or calling your MD immediately  if symptoms less severe.  You Must read complete instructions/literature along with all the possible adverse reactions/side effects for all the Medicines you take and that have been prescribed to you. Take any new Medicines after you have completely understood and accpet all the possible adverse reactions/side effects.   Do not drive, operate heavy machinery, perform activities at heights, swimming or participation in water activities or provide baby sitting services if your were admitted for syncope or siezures until you have seen by Primary MD or a Neurologist and advised to do so again.  Do not drive when taking Pain medications.    Do not take more than prescribed Pain, Sleep and Anxiety Medications  Special Instructions: If you have smoked or chewed Tobacco  in the last 2 yrs please stop smoking, stop any regular Alcohol  and or any Recreational drug use.  Wear Seat belts while driving.   Please note  You were cared for by a hospitalist during your hospital stay. If you have any questions about your discharge medications or the care you received while you were in the hospital after you are discharged, you can call the unit and asked to speak with the hospitalist on call if the hospitalist that took care of you is not available. Once you are discharged, your primary care physician will handle any further medical issues. Please note that NO REFILLS for any discharge medications will be authorized once you are discharged, as it is imperative that you return to your primary care physician (or establish a relationship with a primary care physician if you do not have one) for your aftercare needs so that they can reassess your need for medications and monitor your lab values.     Increase activity slowly     Complete by:  As directed            Discharge Medications     Medication List    TAKE these medications        CALCIUM-MAGNESUIUM-ZINC 333-133-8.3 MG Tabs  Take 1 tablet by mouth 2 (two) times daily.     desloratadine 5 MG tablet  Commonly known as:  CLARINEX  TAKE ONE TABLET BY MOUTH ONCE DAILY     fish oil-omega-3 fatty acids 1000 MG capsule  Take 1 capsule by mouth daily.     fluticasone 50 MCG/ACT nasal spray  Commonly known as:  FLONASE  Place 2 sprays into both nostrils daily.     lansoprazole 30 MG capsule  Commonly known as:  PREVACID  TAKE ONE CAPSULE BY MOUTH ONCE DAILY     latanoprost 0.005 % ophthalmic solution  Commonly known as:  XALATAN  Place 1 drop into both eyes at bedtime.     metoprolol succinate 25 MG 24 hr tablet  Commonly known as:  TOPROL-XL  TAKE ONE TABLET BY MOUTH ONCE DAILY     niacin 500 MG CR tablet  Commonly known as:  NIASPAN  Take 250-500 mg by mouth 2 (two) times daily. Take 250 mg in the am and 500 mg at bedtime     PROAIR HFA 108 (90 Base) MCG/ACT inhaler  Generic drug:  albuterol  Inhale 1 puff into the lungs every 6 (six) hours as needed for wheezing or shortness of breath (or with excersize).     selenium 50 MCG Tabs tablet  Take 50 mcg by mouth daily.     Vitamin B-12 1000 MCG Subl  Place 1 tablet under the tongue daily.        Allergies  Allergen Reactions  . Aspirin Shortness Of Breath  . Moxifloxacin Shortness Of Breath  . Penicillins Shortness Of Breath and Itching    Did PCN reaction causing immediate rash, facial/tongue/throat swelling, SOB or lightheadedness with hypotension: yes Did PCN reaction causing severe rash involving mucus membranes or skin necrosis: no Did PCN reaction that required hospitalization: was already prepared for allergic reaction, did not have to go to hospital Did PCN reaction occurring within the last 10 years: no If all of the above answers are "NO", then may proceed with  Cephalosporin use.   Marland Kitchen Propofol Anaphylaxis    Stopped breathing  . Hydrochlorothiazide Nausea And Vomiting  . Metformin And Related     Leg and abdominal cramps  . Sulfonamide Derivatives Other (See Comments)    unknown        Follow-up Information    Follow up with Sanda Linger, MD. Schedule an appointment as soon as possible for a visit in 1 week.   Specialty:  Internal Medicine   Contact information:   [address] [place] [address] [phone]       Major procedures and Radiology Reports - PLEASE review detailed and final reports thoroughly  -     TTE  Left ventricle: The cavity size was normal. Wall thickness was normal. Systolic function was normal. The estimated ejection fraction was in the range of 60% to 65%. Wall motion was normal; there were no regional wall motion abnormalities. Doppler parameters are consistent with abnormal left ventricular relaxation (grade 1 diastolic dysfunction).    VASCULAR LAB - PRELIMINARY -  Bilateral lower extremity venous duplex has been completed. Bilateral: No evidence of DVT, superficial thrombosis, or Baker's Cyst.  Ct Abdomen Pelvis Wo Contrast  11/09/2015  CLINICAL DATA:  Left upper quadrant abdominal pain. One episode of vomiting last night. EXAM: CT ABDOMEN AND PELVIS WITHOUT CONTRAST TECHNIQUE: Multidetector CT imaging of the abdomen and pelvis was performed following the standard protocol without IV contrast. COMPARISON:  06/10/2007. FINDINGS: Lower chest:  Mild bilateral dependent atelectasis/ scarring. Hepatobiliary: Little change in previously demonstrated liver cysts. Normal appearing gallbladder. Pancreas: No mass or inflammatory process identified on this un-enhanced exam. Spleen: Within normal limits in size and appearance. Adrenals/Urinary Tract: Normal appearing adrenal glands, kidneys, ureters and urinary bladder. No calculi or hydronephrosis. No visible mass. Stomach/Bowel: Mild sigmoid colon  diverticulosis. No gastric or small bowel abnormalities. Surgically absent appendix. Vascular/Lymphatic: No pathologically enlarged lymph nodes. No evidence of abdominal aortic aneurysm. Reproductive: Small calcified uterine fibroid.  No adnexal mass. Other: None. Musculoskeletal: Lumbar and lower thoracic  spine degenerative changes. IMPRESSION: No acute abnormality. Electronically Signed   By: Beckie SaltsSteven  Reid M.D.   On: 11/09/2015 09:38   Dg Chest 2 View  11/09/2015  CLINICAL DATA:  82 year old female with a history of left upper quadrant pain EXAM: CHEST  2 VIEW COMPARISON:  04/02/2013, 12/01/2007 FINDINGS: Cardiomediastinal silhouette unchanged in size and contour. No confluent airspace disease, pneumothorax, or pleural effusion. Unremarkable appearance of the upper abdomen. IMPRESSION: No radiographic evidence of acute cardiopulmonary disease, background chronic changes. Aortic atherosclerosis. Signed, Yvone NeuJaime S. Loreta AveWagner, DO [facility] Electronically Signed   By: Gilmer MorJaime  Wagner D.O.   On: 11/09/2015 09:31    Micro Results      No results found for this or any previous visit (from the past 240 hour(s)).  Today   Subjective    Brianna Mcdonald today has no headache,no chest abdominal pain,no new weakness tingling or numbness, feels much better wants to go home today.    Objective   Blood pressure 121/52, pulse 60, temperature 98.2 F (36.8 C), temperature source Oral, resp. rate 16, height 5\' 1"  (1.549 m), weight 65.772 kg (145 lb), SpO2 98 %.   Intake/Output Summary (Last 24 hours) at 11/10/15 1135 Last data filed at 11/10/15 0700  Gross per 24 hour  Intake   1600 ml  Output      0 ml  Net   1600 ml    Exam Awake Alert, Oriented x 3, No new F.N deficits, Normal affect [place].AT,PERRAL Supple Neck,No JVD, No cervical lymphadenopathy appriciated.  Symmetrical Chest wall movement, Good air movement bilaterally, CTAB RRR,No  Gallops,Rubs or new Murmurs, No Parasternal Heave +ve B.Sounds, Abd Soft, Non tender, No organomegaly appriciated, No rebound -guarding or rigidity. No Cyanosis, Clubbing or edema, No new Rash or bruise   Data Review   CBC w Diff: Lab Results  Component Value Date   WBC 3.3* 11/10/2015   HGB 12.2 11/10/2015   HCT 36.3 11/10/2015   PLT 155 11/10/2015   LYMPHOPCT 49.8* 03/09/2015   MONOPCT 10.1 03/09/2015   EOSPCT 2.1 03/09/2015   BASOPCT 0.4 03/09/2015    CMP: Lab Results  Component Value Date   NA 138 11/10/2015   K 3.9 11/10/2015   CL 108 11/10/2015   CO2 23 11/10/2015   BUN 11 11/10/2015   CREATININE 0.77 11/10/2015   PROT 7.8 11/09/2015   ALBUMIN 3.9 11/09/2015   BILITOT 0.7 11/09/2015   ALKPHOS 71 11/09/2015   AST 32 11/09/2015   ALT 19 11/09/2015  .   Total Time in preparing paper work, data evaluation and todays exam - 35 minutes  Leroy SeaSINGH,PRASHANT K M.D on 11/10/2015 at 11:35 AM  [facility]   Office  [phone]

## 2015-11-10 NOTE — Care Management Obs Status (Signed)
MEDICARE OBSERVATION STATUS NOTIFICATION   Patient Details  Name: Brianna Mcdonald MRN: [id number] Date of Birth: [birth year]   Medicare Observation Status Notification Given:  Yes    MahabirOlegario Messier, Buford Bremer, RN 11/10/2015, 12:10 PM

## 2015-11-10 NOTE — Care Management Note (Signed)
Case Management Note  Patient Details  Name: Arlice ColtRuby M Tardiff MRN: [id number] Date of Birth: 1932/05/19  Subjective/Objective: Patient will need [facility] non emergency ambulance to home-confirmed home address.Nsg will manage calling [phone]-[phone].                  Action/Plan:d/c home.   Expected Discharge Date:                Expected Discharge Plan:  Home/Self Care  In-House Referral:     Discharge planning Services  CM Consult  Post Acute Care Choice:    Choice offered to:     DME Arranged:    DME Agency:     HH Arranged:    HH Agency:     Status of Service:  Completed, signed off  If discussed at [employer] Length of Stay Meetings, dates discussed:    Additional Comments:  Lanier ClamMahabir, Maizy Davanzo, RN 11/10/2015, 1:24 PM

## 2015-11-10 NOTE — Discharge Instructions (Signed)
Follow with Primary MD Sanda Lingerhomas Jones, MD in 7 days   Get CBC, CMP, 2 view Chest X ray checked  by Primary MD or SNF MD in 5-7 days ( we routinely change or add medications that can affect your baseline labs and fluid status, therefore we recommend that you get the mentioned basic workup next visit with your PCP, your PCP may decide not to get them or add new tests based on their clinical decision)   Activity: As tolerated with Full fall precautions use walker/cane & assistance as needed   Disposition Home    Diet:   Heart Healthy    For Heart failure patients - Check your Weight same time everyday, if you gain over 2 pounds, or you develop in leg swelling, experience more shortness of breath or chest pain, call your Primary MD immediately. Follow Cardiac Low Salt Diet and 1.5 lit/day fluid restriction.   On your next visit with your primary care physician please Get Medicines reviewed and adjusted.   Please request your Prim.MD to go over all Hospital Tests and Procedure/Radiological results at the follow up, please get all Hospital records sent to your Prim MD by signing hospital release before you go home.   If you experience worsening of your admission symptoms, develop shortness of breath, life threatening emergency, suicidal or homicidal thoughts you must seek medical attention immediately by calling 911 or calling your MD immediately  if symptoms less severe.  You Must read complete instructions/literature along with all the possible adverse reactions/side effects for all the Medicines you take and that have been prescribed to you. Take any new Medicines after you have completely understood and accpet all the possible adverse reactions/side effects.   Do not drive, operate heavy machinery, perform activities at heights, swimming or participation in water activities or provide baby sitting services if your were admitted for syncope or siezures until you have seen by Primary MD or a  Neurologist and advised to do so again.  Do not drive when taking Pain medications.    Do not take more than prescribed Pain, Sleep and Anxiety Medications  Special Instructions: If you have smoked or chewed Tobacco  in the last 2 yrs please stop smoking, stop any regular Alcohol  and or any Recreational drug use.  Wear Seat belts while driving.   Please note  You were cared for by a hospitalist during your hospital stay. If you have any questions about your discharge medications or the care you received while you were in the hospital after you are discharged, you can call the unit and asked to speak with the hospitalist on call if the hospitalist that took care of you is not available. Once you are discharged, your primary care physician will handle any further medical issues. Please note that NO REFILLS for any discharge medications will be authorized once you are discharged, as it is imperative that you return to your primary care physician (or establish a relationship with a primary care physician if you do not have one) for your aftercare needs so that they can reassess your need for medications and monitor your lab values.

## 2015-11-10 NOTE — Progress Notes (Signed)
D/C instructions given to Patient. Answered all questions. Patient will be D/C home in stable condition. [facility] will transport. [facility] has been called for patient and granddaughter.

## 2015-11-10 NOTE — Care Management Note (Signed)
Case Management Note  Patient Details  Name: Arlice ColtRuby M Voght MRN: [id number] Date of Birth: 1933-01-20  Subjective/Objective:  82 y/o f admitted w/gastroenteritis. From home.                  Action/Plan:d/c home no needs or orders.   Expected Discharge Date:                 Expected Discharge Plan:  Home/Self Care  In-House Referral:     Discharge planning Services  CM Consult  Post Acute Care Choice:    Choice offered to:     DME Arranged:    DME Agency:     HH Arranged:    HH Agency:     Status of Service:  Completed, signed off  If discussed at [employer] Length of Stay Meetings, dates discussed:    Additional Comments:  Lanier ClamMahabir, Jireh Vinas, RN 11/10/2015, 12:11 PM

## 2015-11-10 NOTE — Progress Notes (Signed)
Patient Profile: 82 y/o female with h/o HTN, DM, HLD, GERD, palpitations and IBS [facility] with complaint of abdominal pain + elevated troponin.   Subjective: Feels better. No further abdominal pain nor vomiting. Continues to deny any CP.   Objective: Vital signs in last 24 hours: Temp:  [97.9 F (36.6 C)-98.7 F (37.1 C)] 98.2 F (36.8 C) (07/12 0500) Pulse Rate:  [52-60] 60 (07/12 0500) Resp:  [11-16] 16 (07/12 0500) BP: (106-132)/(42-63) 106/44 mmHg (07/12 0500) SpO2:  [98 %-100 %] 98 % (07/12 0500) Weight:  [145 lb (65.772 kg)] 145 lb (65.772 kg) (07/12 0500) Last BM Date: 11/08/15  Intake/Output from previous day: 07/11 0701 - 07/12 0700 In: 1600 [I.V.:1600] Out: -  Intake/Output this shift:    Medications Current Facility-Administered Medications  Medication Dose Route Frequency Provider Last Rate Last Dose  . 0.9 %  sodium chloride infusion   Intravenous Continuous Belkys A Regalado, MD 100 mL/hr at 11/10/15 0631    . acetaminophen (TYLENOL) tablet 650 mg  650 mg Oral Q6H PRN Belkys A Regalado, MD       Or  . acetaminophen (TYLENOL) suppository 650 mg  650 mg Rectal Q6H PRN Belkys A Regalado, MD      . albuterol (PROVENTIL) (2.5 MG/3ML) 0.083% nebulizer solution 2.5 mg  2.5 mg Nebulization Q6H PRN Belkys A Regalado, MD      . CALCIUM-MAGNESUIUM-ZINC 333-133-8.3 MG TABS 1 tablet  1 tablet Oral BID Belkys A Regalado, MD   1 tablet at 11/09/15 2128  . enoxaparin (LOVENOX) injection 40 mg  40 mg Subcutaneous Q24H Belkys A Regalado, MD   40 mg at 11/09/15 1723  . fluticasone (FLONASE) 50 MCG/ACT nasal spray 2 spray  2 spray Each Nare Daily Belkys A Regalado, MD   2 spray at 11/09/15 1723  . latanoprost (XALATAN) 0.005 % ophthalmic solution 1 drop  1 drop Both Eyes QHS Belkys A Regalado, MD   1 drop at 11/09/15 2128  . loratadine (CLARITIN) tablet 10 mg  10 mg Oral Daily Belkys A Regalado, MD   10 mg at 11/09/15 1723  . metoprolol succinate (TOPROL-XL) 24  hr tablet 25 mg  25 mg Oral Daily Belkys A Regalado, MD      . niacin CR capsule 250 mg  250 mg Oral Daily Belkys A Regalado, MD       And  . niacin CR capsule 500 mg  500 mg Oral QHS Belkys A Regalado, MD   500 mg at 11/09/15 2128  . omega-3 acid ethyl esters (LOVAZA) capsule 1 g  1 capsule Oral Daily Belkys A Regalado, MD   1 g at 11/09/15 1723  . ondansetron (ZOFRAN) tablet 4 mg  4 mg Oral Q6H PRN Belkys A Regalado, MD       Or  . ondansetron (ZOFRAN) injection 4 mg  4 mg Intravenous Q6H PRN Belkys A Regalado, MD      . ondansetron (ZOFRAN-ODT) disintegrating tablet 4 mg  4 mg Oral Once PRN Tilden FossaElizabeth Rees, MD      . pantoprazole (PROTONIX) EC tablet 20 mg  20 mg Oral Daily Belkys A Regalado, MD   20 mg at 11/09/15 1723  . sodium chloride flush (NS) 0.9 % injection 3 mL  3 mL Intravenous Q12H Belkys A Regalado, MD   3 mL at 11/09/15 2128  . vitamin B-12 (CYANOCOBALAMIN) tablet 1,000 mcg  1,000 mcg Oral Daily Belkys A Regalado, MD   1,000 mcg  at 11/09/15 1723    PE: General appearance: alert, cooperative and no distress Neck: no carotid bruit and no JVD Lungs: clear to auscultation bilaterally Heart: regular rate and rhythm, S1, S2 normal, no murmur, click, rub or gallop Abdomen: soft, non-tender; bowel sounds normal; no masses,  no organomegaly Extremities: no LEE Pulses: 2+ and symmetric Skin: warm and dry' Neurologic: Grossly normal  Lab Results:   Recent Labs  11/09/15 0748 11/10/15 0507  WBC 6.2 3.3*  HGB 13.9 12.2  HCT 40.8 36.3  PLT 189 155   BMET  Recent Labs  11/09/15 0748 11/10/15 0507  NA 138 138  K 4.6 3.9  CL 103 108  CO2 28 23  GLUCOSE 127* 89  BUN 16 11  CREATININE 0.88 0.77  CALCIUM 9.5 8.3*   Cardiac Panel (last 3 results)  Recent Labs  11/09/15 1508 11/09/15 2211 11/10/15 0507  TROPONINI 0.11* 0.05* 0.03*    Studies/Results: 2D Echo 11/09/15 Study Conclusions  - Left ventricle: The cavity size was normal. Wall thickness was  normal.  Systolic function was normal. The estimated ejection  fraction was in the range of 60% to 65%. Wall motion was normal;  there were no regional wall motion abnormalities. Doppler  parameters are consistent with abnormal left ventricular  relaxation (grade 1 diastolic dysfunction).   Assessment/Plan   Principal Problem:   Gastroenteritis Active Problems:   Essential hypertension, benign   Type II diabetes mellitus with manifestations (HCC)   Elevated troponin   1. Abdominal Pain + Abnormal troponin: symptoms are atypical and not c/w ACS. She describes periumbilical/ LUQ pain + emesis x 2, however Lipase is normal and CT of abdomen and pelvis unremarkable. POC troponin in the ED was abnormal at 0.17. She denies CP. EKG without ST elevations/depressions. She is currently pain free and abdominal exam is unremarkable. Troponins were cycled x 3 and demonstrate downward trend 0.11>>0.05>>0.03. 2D echo shows normal LVEF and wall motion. EF is 60-65%. No significant valvular disease. Clinical presentation is more consistent with gastroenteritis. No further inpatient cardiac w/u indicated.   Murry Khiev M. Delmer Islam 11/10/2015 7:49 AM

## 2015-11-12 ENCOUNTER — Telehealth: Payer: Self-pay | Admitting: *Deleted

## 2015-11-12 NOTE — Telephone Encounter (Signed)
Transition Care Management Follow-up Telephone Call   Date discharged? 11/10/15   How have you been since you were released from the hospital? Spoke with pt she states she is doing alright   Do you understand why you were in the hospital? YES   Do you understand the discharge instructions? YES   Where were you discharged to? Home   Items Reviewed:  Medications reviewed: YES  Allergies reviewed: YES  Dietary changes reviewed: YES  Referrals reviewed: YES   Functional Questionnaire:   Activities of Daily Living (ADLs):   She states she are independent in the following: ambulation, bathing and hygiene, feeding, continence, grooming, toileting and dressing States she doesnt require assistance    Any transportation issues/concerns?: YES   Any patient concerns? YES   Confirmed importance and date/time of follow-up visits scheduled YES, appt 11/16/15  Provider Appointment booked with Dr. Yetta BarreJones  Confirmed with patient if condition begins to worsen call PCP or go to the ER.  Patient was given the office number and encouraged to call back with question or concerns.  : YES

## 2015-11-16 ENCOUNTER — Ambulatory Visit (INDEPENDENT_AMBULATORY_CARE_PROVIDER_SITE_OTHER): Payer: Medicare Other | Admitting: Internal Medicine

## 2015-11-16 ENCOUNTER — Encounter: Payer: Self-pay | Admitting: Internal Medicine

## 2015-11-16 VITALS — BP 120/72 | HR 55 | Temp 98.0°F | Resp 16 | Wt 139.0 lb

## 2015-11-16 DIAGNOSIS — E118 Type 2 diabetes mellitus with unspecified complications: Secondary | ICD-10-CM

## 2015-11-16 DIAGNOSIS — Z09 Encounter for follow-up examination after completed treatment for conditions other than malignant neoplasm: Secondary | ICD-10-CM

## 2015-11-16 DIAGNOSIS — K529 Noninfective gastroenteritis and colitis, unspecified: Secondary | ICD-10-CM

## 2015-11-16 DIAGNOSIS — I10 Essential (primary) hypertension: Secondary | ICD-10-CM | POA: Diagnosis not present

## 2015-11-16 NOTE — Progress Notes (Signed)
Subjective:  Patient ID: Brianna Mcdonald, female    DOB: 09-12-1932  Age: 82 y.o. MRN: [id number]  CC: Hypertension and Diabetes   HPI [name] presents for follow-up after recent admission for gastroenteritis. She was admitted for 1 night and received IV fluids. She tells me her granddaughter was sick and then she got sick too with nausea, vomiting, and diarrhea. She was discharged 6 days ago and has been doing well. She has had a good appetite with no more episodes of nausea, vomiting, or diarrhea. She feels well today and offers no complaints.  Outpatient Prescriptions Prior to Visit  Medication Sig Dispense Refill  . albuterol (PROAIR HFA) 108 (90 BASE) MCG/ACT inhaler Inhale 1 puff into the lungs every 6 (six) hours as needed for wheezing or shortness of breath (or with excersize).    . CALCIUM-MAGNESUIUM-ZINC 333-133-8.3 MG TABS Take 1 tablet by mouth 2 (two) times daily.     . Cyanocobalamin (VITAMIN B-12) 1000 MCG SUBL Place 1 tablet under the tongue daily.     Marland Kitchen desloratadine (CLARINEX) 5 MG tablet TAKE ONE TABLET BY MOUTH ONCE DAILY (Patient taking differently: TAKE  by MOUTH ONCE DAILY) 90 tablet 2  . fish oil-omega-3 fatty acids 1000 MG capsule Take 1 capsule by mouth daily.     . fluticasone (FLONASE) 50 MCG/ACT nasal spray Place 2 sprays into both nostrils daily. (Patient taking differently: Place 2 sprays into both nostrils 2 (two) times daily as needed for allergies. ) 16 g 11  . lansoprazole (PREVACID) 30 MG capsule TAKE ONE CAPSULE BY MOUTH ONCE DAILY (Patient taking differently: TAKE  BY MOUTH ONCE DAILY) 90 capsule 3  . latanoprost (XALATAN) 0.005 % ophthalmic solution Place 1 drop into both eyes at bedtime.     . metoprolol succinate (TOPROL-XL) 25 MG 24 hr tablet TAKE ONE TABLET BY MOUTH ONCE DAILY (Patient taking differently: TAKE  BY MOUTH ONCE DAILY) 90 tablet 3  . niacin (NIASPAN) 500 MG CR tablet Take 250-500 mg by mouth 2 (two) times daily.  Take 250 mg in the am and 500 mg at bedtime    . selenium 50 MCG TABS tablet Take 50 mcg by mouth daily.      No facility-administered medications prior to visit.    ROS Review of Systems  Constitutional: Negative.  Negative for fever, chills, activity change, appetite change, fatigue and unexpected weight change.  HENT: Negative.  Negative for sinus pressure, sore throat and trouble swallowing.   Eyes: Negative.  Negative for visual disturbance.  Respiratory: Negative.  Negative for cough, choking, chest tightness, shortness of breath and stridor.   Cardiovascular: Negative.  Negative for chest pain, palpitations and leg swelling.  Gastrointestinal: Negative.  Negative for nausea, vomiting, abdominal pain, diarrhea and constipation.  Endocrine: Negative.   Genitourinary: Negative.  Negative for dysuria, urgency, hematuria, flank pain, decreased urine volume and difficulty urinating.  Musculoskeletal: Negative.  Negative for back pain and neck pain.  Skin: Negative.  Negative for color change and rash.  Allergic/Immunologic: Negative.   Neurological: Negative.  Negative for dizziness, tremors, weakness, light-headedness and numbness.  Hematological: Negative.  Negative for adenopathy. Does not bruise/bleed easily.  Psychiatric/Behavioral: Negative.     Objective:  BP 120/72 mmHg  Pulse 55  Temp(Src) 98 F (36.7 C) (Oral)  Resp 16  Wt 139 lb (63.05 kg)  SpO2 98%  BP Readings from Last 3 Encounters:  11/16/15 120/72  11/10/15 117/55  08/24/15 122/68    Wt  Readings from Last 3 Encounters:  11/16/15 139 lb (63.05 kg)  11/10/15 145 lb (65.772 kg)  08/24/15 140 lb (63.504 kg)    Physical Exam  Constitutional: She is oriented to person, place, and time. No distress.  HENT:  Mouth/Throat: Oropharynx is clear and moist. No oropharyngeal exudate.  Eyes: Conjunctivae are normal. Right eye exhibits no discharge. Left eye exhibits no discharge. No scleral icterus.  Neck: Normal  range of motion. Neck supple. No JVD present. No tracheal deviation present. No thyromegaly present.  Cardiovascular: Normal rate, regular rhythm, normal heart sounds and intact distal pulses.  Exam reveals no gallop and no friction rub.   No murmur heard. Pulmonary/Chest: Effort normal and breath sounds normal. No stridor. No respiratory distress. She has no wheezes. She has no rales. She exhibits no tenderness.  Abdominal: Soft. Bowel sounds are normal. She exhibits no distension and no mass. There is no tenderness. There is no rebound and no guarding.  Musculoskeletal: Normal range of motion. She exhibits no edema or tenderness.  Lymphadenopathy:    She has no cervical adenopathy.  Neurological: She is oriented to person, place, and time.  Skin: Skin is warm and dry. No rash noted. She is not diaphoretic. No erythema. No pallor.  Vitals reviewed.   Lab Results  Component Value Date   WBC 3.3* 11/10/2015   HGB 12.2 11/10/2015   HCT 36.3 11/10/2015   PLT 155 11/10/2015   GLUCOSE 89 11/10/2015   CHOL 214* 03/09/2015   TRIG 93.0 03/09/2015   HDL 69.00 03/09/2015   LDLDIRECT 144.4 10/30/2012   LDLCALC 126* 03/09/2015   ALT 19 11/09/2015   AST 32 11/09/2015   NA 138 11/10/2015   K 3.9 11/10/2015   CL 108 11/10/2015   CREATININE 0.77 11/10/2015   BUN 11 11/10/2015   CO2 23 11/10/2015   TSH 0.94 03/09/2015   HGBA1C 6.5 08/24/2015   MICROALBUR <0.7 03/09/2015    Ct Abdomen Pelvis Wo Contrast  11/09/2015  CLINICAL DATA:  Left upper quadrant abdominal pain. One episode of vomiting last night. EXAM: CT ABDOMEN AND PELVIS WITHOUT CONTRAST TECHNIQUE: Multidetector CT imaging of the abdomen and pelvis was performed following the standard protocol without IV contrast. COMPARISON:  06/10/2007. FINDINGS: Lower chest:  Mild bilateral dependent atelectasis/ scarring. Hepatobiliary: Little change in previously demonstrated liver cysts. Normal appearing gallbladder. Pancreas: No mass or  inflammatory process identified on this un-enhanced exam. Spleen: Within normal limits in size and appearance. Adrenals/Urinary Tract: Normal appearing adrenal glands, kidneys, ureters and urinary bladder. No calculi or hydronephrosis. No visible mass. Stomach/Bowel: Mild sigmoid colon diverticulosis. No gastric or small bowel abnormalities. Surgically absent appendix. Vascular/Lymphatic: No pathologically enlarged lymph nodes. No evidence of abdominal aortic aneurysm. Reproductive: Small calcified uterine fibroid.  No adnexal mass. Other: None. Musculoskeletal: Lumbar and lower thoracic spine degenerative changes. IMPRESSION: No acute abnormality. Electronically Signed   By: Beckie SaltsSteven  Reid M.D.   On: 11/09/2015 09:38   Dg Chest 2 View  11/09/2015  CLINICAL DATA:  82 year old female with a history of left upper quadrant pain EXAM: CHEST  2 VIEW COMPARISON:  04/02/2013, 12/01/2007 FINDINGS: Cardiomediastinal silhouette unchanged in size and contour. No confluent airspace disease, pneumothorax, or pleural effusion. Unremarkable appearance of the upper abdomen. IMPRESSION: No radiographic evidence of acute cardiopulmonary disease, background chronic changes. Aortic atherosclerosis. Signed, Yvone NeuJaime S. Loreta AveWagner, DO [facility] Electronically Signed   By: Gilmer MorJaime  Wagner D.O.   On: 11/09/2015 09:31  Assessment & Plan:   Geralynn was seen today for hypertension and diabetes.  Diagnoses and all orders for this visit:  Gastroenteritis - this has resolved  Essential hypertension, benign- her blood pressure is well-controlled, her recent electrolytes and renal function were normal.  Type 2 diabetes mellitus with complication, without long-term current use of insulin (HCC)- recent A1c shows that her blood sugars have been well controlled  I am having Ms. Specht maintain her niacin, fish oil-omega-3 fatty acids, CALCIUM-MAGNESUIUM-ZINC, Vitamin B-12,  albuterol, selenium, metoprolol succinate, lansoprazole, latanoprost, fluticasone, and desloratadine.  No orders of the defined types were placed in this encounter.     Follow-up: Return in about 3 months (around 02/16/2016).  Sanda Linger, MD

## 2015-11-16 NOTE — Patient Instructions (Signed)

## 2015-11-16 NOTE — Progress Notes (Signed)
Pre visit review using our clinic review tool, if applicable. No additional management support is needed unless otherwise documented below in the visit note. 

## 2016-02-21 ENCOUNTER — Ambulatory Visit (INDEPENDENT_AMBULATORY_CARE_PROVIDER_SITE_OTHER): Payer: Medicare Other | Admitting: Internal Medicine

## 2016-02-21 ENCOUNTER — Encounter: Payer: Self-pay | Admitting: Internal Medicine

## 2016-02-21 ENCOUNTER — Other Ambulatory Visit (INDEPENDENT_AMBULATORY_CARE_PROVIDER_SITE_OTHER): Payer: Medicare Other

## 2016-02-21 VITALS — BP 126/62 | HR 54 | Temp 98.4°F | Resp 16 | Ht 61.0 in | Wt 138.2 lb

## 2016-02-21 DIAGNOSIS — L602 Onychogryphosis: Secondary | ICD-10-CM

## 2016-02-21 DIAGNOSIS — I10 Essential (primary) hypertension: Secondary | ICD-10-CM | POA: Diagnosis not present

## 2016-02-21 DIAGNOSIS — E785 Hyperlipidemia, unspecified: Secondary | ICD-10-CM

## 2016-02-21 DIAGNOSIS — E041 Nontoxic single thyroid nodule: Secondary | ICD-10-CM | POA: Diagnosis not present

## 2016-02-21 DIAGNOSIS — E118 Type 2 diabetes mellitus with unspecified complications: Secondary | ICD-10-CM

## 2016-02-21 DIAGNOSIS — Z23 Encounter for immunization: Secondary | ICD-10-CM

## 2016-02-21 LAB — CBC WITH DIFFERENTIAL/PLATELET
BASOS ABS: 0 10*3/uL (ref 0.0–0.1)
BASOS PCT: 0.3 % (ref 0.0–3.0)
EOS ABS: 0.1 10*3/uL (ref 0.0–0.7)
Eosinophils Relative: 2.6 % (ref 0.0–5.0)
HCT: 37.5 % (ref 36.0–46.0)
HEMOGLOBIN: 12.8 g/dL (ref 12.0–15.0)
Lymphocytes Relative: 29.6 % (ref 12.0–46.0)
Lymphs Abs: 1.5 10*3/uL (ref 0.7–4.0)
MCHC: 34 g/dL (ref 30.0–36.0)
MCV: 95.9 fl (ref 78.0–100.0)
MONO ABS: 0.5 10*3/uL (ref 0.1–1.0)
Monocytes Relative: 9.7 % (ref 3.0–12.0)
Neutro Abs: 3 10*3/uL (ref 1.4–7.7)
Neutrophils Relative %: 57.8 % (ref 43.0–77.0)
Platelets: 188 10*3/uL (ref 150.0–400.0)
RBC: 3.91 Mil/uL (ref 3.87–5.11)
RDW: 13.9 % (ref 11.5–15.5)
WBC: 5.2 10*3/uL (ref 4.0–10.5)

## 2016-02-21 LAB — MICROALBUMIN / CREATININE URINE RATIO
Creatinine,U: 113.9 mg/dL
MICROALB/CREAT RATIO: 0.6 mg/g (ref 0.0–30.0)
Microalb, Ur: <0.7 mg/dL (ref 0.0–1.9)

## 2016-02-21 LAB — LIPID PANEL
CHOLESTEROL: 203 mg/dL — AB (ref 0–200)
HDL: 67.8 mg/dL (ref >39.00)
LDL Cholesterol: 122 mg/dL — ABNORMAL HIGH (ref 0–99)
NonHDL: 134.93
TRIGLYCERIDES: 64 mg/dL (ref 0.0–149.0)
Total CHOL/HDL Ratio: 3
VLDL: 12.8 mg/dL (ref 0.0–40.0)

## 2016-02-21 LAB — URINALYSIS, ROUTINE W REFLEX MICROSCOPIC
Bilirubin Urine: NEGATIVE
HGB URINE DIPSTICK: NEGATIVE
Ketones, ur: NEGATIVE
Leukocytes, UA: NEGATIVE
NITRITE: NEGATIVE
RBC / HPF: NONE SEEN (ref 0–?)
Specific Gravity, Urine: 1.01 (ref 1.000–1.030)
Total Protein, Urine: NEGATIVE
Urine Glucose: NEGATIVE
Urobilinogen, UA: 0.2 (ref 0.0–1.0)
WBC UA: NONE SEEN (ref 0–?)
pH: 7 (ref 5.0–8.0)

## 2016-02-21 LAB — COMPREHENSIVE METABOLIC PANEL
ALBUMIN: 3.8 g/dL (ref 3.5–5.2)
ALK PHOS: 79 U/L (ref 39–117)
ALT: 13 U/L (ref 0–35)
AST: 20 U/L (ref 0–37)
BILIRUBIN TOTAL: 0.6 mg/dL (ref 0.2–1.2)
BUN: 13 mg/dL (ref 6–23)
CHLORIDE: 103 meq/L (ref 96–112)
CO2: 32 mEq/L (ref 19–32)
CREATININE: 0.87 mg/dL (ref 0.40–1.20)
Calcium: 9.8 mg/dL (ref 8.4–10.5)
GFR: 80.28 mL/min (ref >60.00)
Glucose, Bld: 108 mg/dL — ABNORMAL HIGH (ref 70–99)
Potassium: 4.3 mEq/L (ref 3.5–5.1)
SODIUM: 140 meq/L (ref 135–145)
TOTAL PROTEIN: 7.6 g/dL (ref 6.0–8.3)

## 2016-02-21 LAB — HEMOGLOBIN A1C: HEMOGLOBIN A1C: 6.3 % (ref 4.6–6.5)

## 2016-02-21 LAB — TSH: TSH: 1.43 u[IU]/mL (ref 0.35–4.50)

## 2016-02-21 NOTE — Progress Notes (Signed)
Subjective:  Patient ID: Brianna Mcdonald, female    DOB: [birth year]  Age: 82 y.o. MRN: [id number]  CC: Hypertension; Hyperlipidemia; and Diabetes   HPI KELSI BENHAM presents for follow-up on the above medical problems. She complains that her toenails are too long and she wants a podiatrist to help take care of them. She otherwise feels well and offers no other complaints. She tells me her blood pressure and blood sugars have been well controlled. She's had no recent episodes of headache/blurred vision/chest pain/shortness of breath/palpitations/edema/fatigue.  Outpatient Medications Prior to Visit  Medication Sig Dispense Refill  . albuterol (PROAIR HFA) 108 (90 BASE) MCG/ACT inhaler Inhale 1 puff into the lungs every 6 (six) hours as needed for wheezing or shortness of breath (or with excersize).    . CALCIUM-MAGNESUIUM-ZINC 333-133-8.3 MG TABS Take 1 tablet by mouth 2 (two) times daily.     . Cyanocobalamin (VITAMIN B-12) 1000 MCG SUBL Place 1 tablet under the tongue daily.     Marland Kitchen desloratadine (CLARINEX) 5 MG tablet TAKE ONE TABLET BY MOUTH ONCE DAILY (Patient taking differently: TAKE  by MOUTH ONCE DAILY) 90 tablet 2  . fish oil-omega-3 fatty acids 1000 MG capsule Take 1 capsule by mouth daily.     . fluticasone (FLONASE) 50 MCG/ACT nasal spray Place 2 sprays into both nostrils daily. (Patient taking differently: Place 2 sprays into both nostrils 2 (two) times daily as needed for allergies. ) 16 g 11  . lansoprazole (PREVACID) 30 MG capsule TAKE ONE CAPSULE BY MOUTH ONCE DAILY (Patient taking differently: TAKE  BY MOUTH ONCE DAILY) 90 capsule 3  . latanoprost (XALATAN) 0.005 % ophthalmic solution Place 1 drop into both eyes at bedtime.     . metoprolol succinate (TOPROL-XL) 25 MG 24 hr tablet TAKE ONE TABLET BY MOUTH ONCE DAILY (Patient taking differently: TAKE  BY MOUTH ONCE DAILY) 90 tablet 3  . niacin (NIASPAN) 500 MG CR tablet Take 250-500 mg by mouth 2 (two) times  daily. Take 250 mg in the am and 500 mg at bedtime    . selenium 50 MCG TABS tablet Take 50 mcg by mouth daily.      No facility-administered medications prior to visit.     ROS Review of Systems  Constitutional: Negative.  Negative for appetite change, chills, diaphoresis, fatigue and fever.  HENT: Negative.  Negative for trouble swallowing.   Eyes: Negative for visual disturbance.  Respiratory: Negative.  Negative for cough, choking, chest tightness, shortness of breath and stridor.   Cardiovascular: Negative.  Negative for chest pain, palpitations and leg swelling.  Gastrointestinal: Negative for abdominal pain, constipation, diarrhea, nausea and vomiting.  Endocrine: Negative for cold intolerance, heat intolerance, polydipsia, polyphagia and polyuria.  Genitourinary: Negative.  Negative for difficulty urinating.  Musculoskeletal: Negative.  Negative for myalgias.  Skin: Negative.   Neurological: Negative.  Negative for dizziness, tremors, weakness, light-headedness and numbness.  Hematological: Negative.  Negative for adenopathy. Does not bruise/bleed easily.  Psychiatric/Behavioral: Negative.     Objective:  BP 126/62 (BP Location: Left Arm, Patient Position: Sitting, Cuff Size: Normal)   Pulse (!) 54   Temp 98.4 F (36.9 C) (Oral)   Resp 16   Ht  (1.549 m)   Wt 138 lb 4 oz (62.7 kg)   SpO2 97%   BMI 26.12 kg/m   BP Readings from Last 3 Encounters:  02/21/16 126/62  11/16/15 120/72  11/10/15 (!) 117/55    Wt Readings from Last 3 Encounters:  02/21/16 138 lb 4 oz (62.7 kg)  11/16/15 139 lb (63 kg)  11/10/15 145 lb (65.8 kg)    Physical Exam  Constitutional: She is oriented to person, place, and time. No distress.  HENT:  Mouth/Throat: Oropharynx is clear and moist. No oropharyngeal exudate.  Eyes: Conjunctivae are normal. Right eye exhibits no discharge. Left eye exhibits no discharge. No scleral icterus.  Neck: Normal range of motion. Neck supple. No JVD  present. No tracheal deviation present. No thyromegaly present.  Cardiovascular: Normal rate, regular rhythm, normal heart sounds and intact distal pulses.  Exam reveals no gallop and no friction rub.   No murmur heard. Pulmonary/Chest: Effort normal and breath sounds normal. No stridor. No respiratory distress. She has no wheezes. She has no rales. She exhibits no tenderness.  Abdominal: Soft. Bowel sounds are normal. She exhibits no distension and no mass. There is no tenderness. There is no rebound and no guarding.  Musculoskeletal: Normal range of motion. She exhibits no edema, tenderness or deformity.  Lymphadenopathy:    She has no cervical adenopathy.  Neurological: She is oriented to person, place, and time.  Skin: Skin is warm and dry. No rash noted. She is not diaphoretic. No erythema. No pallor.  Vitals reviewed.   Lab Results  Component Value Date   WBC 5.2 02/21/2016   HGB 12.8 02/21/2016   HCT 37.5 02/21/2016   PLT 188.0 02/21/2016   GLUCOSE 108 (H) 02/21/2016   CHOL 203 (H) 02/21/2016   TRIG 64.0 02/21/2016   HDL 67.80 02/21/2016   LDLDIRECT 144.4 10/30/2012   LDLCALC 122 (H) 02/21/2016   ALT 13 02/21/2016   AST 20 02/21/2016   NA 140 02/21/2016   K 4.3 02/21/2016   CL 103 02/21/2016   CREATININE 0.87 02/21/2016   BUN 13 02/21/2016   CO2 32 02/21/2016   TSH 1.43 02/21/2016   HGBA1C 6.3 02/21/2016   MICROALBUR <0.7 02/21/2016    Ct Abdomen Pelvis Wo Contrast  Result Date: 11/09/2015 CLINICAL DATA:  Left upper quadrant abdominal pain. One episode of vomiting last night. EXAM: CT ABDOMEN AND PELVIS WITHOUT CONTRAST TECHNIQUE: Multidetector CT imaging of the abdomen and pelvis was performed following the standard protocol without IV contrast. COMPARISON:  06/10/2007. FINDINGS: Lower chest:  Mild bilateral dependent atelectasis/ scarring. Hepatobiliary: Little change in previously demonstrated liver cysts. Normal appearing gallbladder. Pancreas: No mass or  inflammatory process identified on this un-enhanced exam. Spleen: Within normal limits in size and appearance. Adrenals/Urinary Tract: Normal appearing adrenal glands, kidneys, ureters and urinary bladder. No calculi or hydronephrosis. No visible mass. Stomach/Bowel: Mild sigmoid colon diverticulosis. No gastric or small bowel abnormalities. Surgically absent appendix. Vascular/Lymphatic: No pathologically enlarged lymph nodes. No evidence of abdominal aortic aneurysm. Reproductive: Small calcified uterine fibroid.  No adnexal mass. Other: None. Musculoskeletal: Lumbar and lower thoracic spine degenerative changes. IMPRESSION: No acute abnormality. Electronically Signed   By: Beckie Salts M.D.   On: 11/09/2015 09:38   Dg Chest 2 View  Result Date: 11/09/2015 CLINICAL DATA:  82 year old female with a history of left upper quadrant pain EXAM: CHEST  2 VIEW COMPARISON:  04/02/2013, 12/01/2007 FINDINGS: Cardiomediastinal silhouette unchanged in size and contour. No confluent airspace disease, pneumothorax, or pleural effusion. Unremarkable appearance of the upper abdomen. IMPRESSION: No radiographic evidence of acute cardiopulmonary disease, background chronic changes. Aortic atherosclerosis. Signed, Yvone Neu. Loreta Ave, DO [facility] Electronically Signed   By: Gilmer Mor D.O.   On: 11/09/2015 09:31  Assessment & Plan:   Shaylene was seen today for hypertension, hyperlipidemia and diabetes.  Diagnoses and all orders for this visit:  Essential hypertension, benign- her blood pressure is well controlled, electrolytes and renal function are stable -     Comprehensive metabolic panel; Future -     CBC with Differential/Platelet; Future -     Urinalysis, Routine w reflex microscopic (not at [facility]); Future  Type 2 diabetes mellitus with complication, without long-term current use of insulin (HCC)- her A1c is at 6.3%, her blood sugars are adequately  well controlled. -     Comprehensive metabolic panel; Future -     Hemoglobin A1c; Future -     Microalbumin / creatinine urine ratio; Future  Left thyroid nodule- This is not painful and cannot be palpated on exam, her thyroid function is normal, will continue to follow -     TSH; Future  Hyperlipidemia with target LDL less than 100- she iha not achieved her LDL goal and is not willing to take a statin -     Lipid panel; Future  Need for prophylactic vaccination and inoculation against influenza -     Flu vaccine HIGH DOSE PF (Fluzone High dose)  Overgrown toenails -     Ambulatory referral to Podiatry   I am having Ms. Czaja maintain her niacin, fish oil-omega-3 fatty acids, CALCIUM-MAGNESUIUM-ZINC, Vitamin B-12, albuterol, selenium, metoprolol succinate, lansoprazole, latanoprost, fluticasone, and desloratadine.  No orders of the defined types were placed in this encounter.    Follow-up: Return in about 6 months (around 08/21/2016).  Sanda Linger, MD

## 2016-02-21 NOTE — Patient Instructions (Signed)

## 2016-02-21 NOTE — Progress Notes (Signed)
Pre visit review using our clinic review tool, if applicable. No additional management support is needed unless otherwise documented below in the visit note. 

## 2016-03-06 ENCOUNTER — Telehealth: Payer: Self-pay | Admitting: Internal Medicine

## 2016-03-06 NOTE — Telephone Encounter (Signed)
Please mail another copy of lab results to patient

## 2016-03-06 NOTE — Telephone Encounter (Signed)
Letter re-printed and mailed.

## 2016-03-08 DIAGNOSIS — H401131 Primary open-angle glaucoma, bilateral, mild stage: Secondary | ICD-10-CM | POA: Diagnosis not present

## 2016-03-08 DIAGNOSIS — H2513 Age-related nuclear cataract, bilateral: Secondary | ICD-10-CM | POA: Diagnosis not present

## 2016-03-22 ENCOUNTER — Ambulatory Visit (INDEPENDENT_AMBULATORY_CARE_PROVIDER_SITE_OTHER): Payer: Medicare Other | Admitting: Podiatry

## 2016-03-22 ENCOUNTER — Encounter: Payer: Self-pay | Admitting: Podiatry

## 2016-03-22 VITALS — BP 122/59 | HR 64 | Resp 16

## 2016-03-22 DIAGNOSIS — L603 Nail dystrophy: Secondary | ICD-10-CM | POA: Diagnosis not present

## 2016-03-22 DIAGNOSIS — E0843 Diabetes mellitus due to underlying condition with diabetic autonomic (poly)neuropathy: Secondary | ICD-10-CM | POA: Diagnosis not present

## 2016-03-22 DIAGNOSIS — M79609 Pain in unspecified limb: Secondary | ICD-10-CM

## 2016-03-22 DIAGNOSIS — M79676 Pain in unspecified toe(s): Secondary | ICD-10-CM

## 2016-03-22 DIAGNOSIS — L608 Other nail disorders: Secondary | ICD-10-CM | POA: Diagnosis not present

## 2016-03-22 DIAGNOSIS — B351 Tinea unguium: Secondary | ICD-10-CM

## 2016-03-22 MED ORDER — CLOTRIMAZOLE-BETAMETHASONE 1-0.05 % EX CREA: 1.0000 "application " | TOPICAL_CREAM | Freq: Two times a day (BID) | CUTANEOUS | 1 refills | Status: DC

## 2016-03-22 NOTE — Progress Notes (Signed)
   Subjective:    Patient ID: Brianna Mcdonald, female    DOB: December 11, 1932, 82 y.o.   MRN: [id number]  HPI    Review of Systems  Musculoskeletal: Positive for arthralgias and gait problem.  Psychiatric/Behavioral: Positive for confusion.  All other systems reviewed and are negative.      Objective:   Physical Exam        Assessment & Plan:

## 2016-04-04 ENCOUNTER — Other Ambulatory Visit: Payer: Self-pay | Admitting: Internal Medicine

## 2016-04-07 ENCOUNTER — Other Ambulatory Visit: Payer: Self-pay

## 2016-04-09 NOTE — Progress Notes (Signed)
Patient ID: Brianna Mcdonald, female   DOB: 1932/11/02, 82 y.o.   MRN: [id number] SUBJECTIVE Patient with a history of diabetes mellitus presents to office today complaining of elongated, thickened nails. Pain while ambulating in shoes. Patient is unable to trim their own nails.  Patient also complains of itching and scaly skin to her right lower extremity.  Allergies  Allergen Reactions  . Aspirin Shortness Of Breath  . Moxifloxacin Shortness Of Breath  . Penicillins Shortness Of Breath and Itching    Did PCN reaction causing immediate rash, facial/tongue/throat swelling, SOB or lightheadedness with hypotension: yes Did PCN reaction causing severe rash involving mucus membranes or skin necrosis: no Did PCN reaction that required hospitalization: was already prepared for allergic reaction, did not have to go to hospital Did PCN reaction occurring within the last 10 years: no If all of the above answers are "NO", then may proceed with Cephalosporin use.   Marland Kitchen. Propofol Anaphylaxis    Stopped breathing  . Hydrochlorothiazide Nausea And Vomiting  . Metformin And Related     Leg and abdominal cramps  . Sulfonamide Derivatives Other (See Comments)    unknown    OBJECTIVE General Patient is awake, alert, and oriented x 3 and in no acute distress. Derm Xerotic, pruritic, hyperkeratotic skin noted to the right foot weightbearing surface consistent with a tinea pedis. Skin is dry and supple bilateral. Negative open lesions or macerations. Remaining integument unremarkable. Nails are tender, long, thickened and dystrophic with subungual debris, consistent with onychomycosis, 1-5 bilateral. No signs of infection noted. Vasc  DP and PT pedal pulses palpable bilaterally. Temperature gradient within normal limits.  Neuro Epicritic and protective threshold sensation diminished bilaterally.  Musculoskeletal Exam No symptomatic pedal deformities noted bilateral. Muscular strength within normal  limits.  ASSESSMENT 1. Diabetes Mellitus w/ peripheral neuropathy 2. Onychomycosis of nail due to dermatophyte bilateral 3. Pain in foot bilateral 4. Tinea pedis right foot  PLAN OF CARE 1. Patient evaluated today. 2. Instructed to maintain good pedal hygiene and foot care. Stressed importance of controlling blood sugar.  3. Mechanical debridement of nails 1-5 bilaterally performed using a nail nipper. Filed with dremel without incident.  4. Prescription for Lotrisone cream  5. Return to clinic in 3 mos.     Felecia ShellingBrent M. Evans, DPM [facility]  Dr. Felecia ShellingBrent M. Evans, DPM   [address]                                        [place], [place] [address]                Office [phone]  Fax [phone]

## 2016-05-15 ENCOUNTER — Ambulatory Visit (INDEPENDENT_AMBULATORY_CARE_PROVIDER_SITE_OTHER): Payer: Medicare Other | Admitting: Internal Medicine

## 2016-05-15 ENCOUNTER — Encounter: Payer: Self-pay | Admitting: Internal Medicine

## 2016-05-15 VITALS — BP 110/50 | HR 71 | Temp 99.2°F | Resp 16 | Ht 61.0 in | Wt 140.0 lb

## 2016-05-15 DIAGNOSIS — M546 Pain in thoracic spine: Secondary | ICD-10-CM | POA: Insufficient documentation

## 2016-05-15 DIAGNOSIS — M5412 Radiculopathy, cervical region: Secondary | ICD-10-CM | POA: Diagnosis not present

## 2016-05-15 MED ORDER — TRAMADOL HCL 50 MG PO TABS: 50.0000 mg | ORAL_TABLET | Freq: Two times a day (BID) | ORAL | 1 refills | Status: DC | PRN

## 2016-05-15 NOTE — Patient Instructions (Signed)
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
## 2016-05-15 NOTE — Progress Notes (Signed)
Pre visit review using our clinic review tool, if applicable. No additional management support is needed unless otherwise documented below in the visit note. 

## 2016-05-18 ENCOUNTER — Ambulatory Visit: Payer: 59

## 2016-05-24 NOTE — Progress Notes (Addendum)
Subjective:   Brianna Mcdonald is a 83 y.o. female who presents for Medicare Annual (Subsequent) preventive examination.  The Patient was informed that the wellness visit is to identify future health risk and educate and initiate measures that can reduce risk for increased disease through the lifespan.   Review of Systems:  No ROS.  Medicare Wellness Visit.  Cardiac Risk Factors include: advanced age (>10men, >26 women);diabetes mellitus;dyslipidemia;family history of premature cardiovascular disease;hypertension   Sleep patterns: sleeps about 8 hours, up to void x 1. Home Safety/Smoke Alarms: Smoke detectors and security in place.  Living environment; residence and Firearm Safety: Daughter and grandkids lives with patient in single story home. 1 step going into home. No firearms.   Seat Belt Safety/Bike Helmet: Wears seat belt.   Counseling:   Eye Exam-Last exam 03/2016. Followed by [facility] (glaucoma) every 6 months.  Dental-Last exam > 5 years, [facility]  Female:   Pap-2014      Mammo-02/18/2015, benign. Order placed.     Dexa scan-12/14/2011. Order placed.      CCS-colonoscopy 08/04/2010, Colitis      Objective:     Vitals: BP (!) 114/58 (BP Location: Left Arm, Patient Position: Sitting, Cuff Size: Normal)   Pulse 67   Ht 5\' 1"  (1.549 m)   Wt 140 lb (63.5 kg)   SpO2 98%   BMI 26.45 kg/m   Body mass index is 26.45 kg/m.   Tobacco History  Smoking Status  . Never Smoker  Smokeless Tobacco  . Never Used     Counseling given: No   Past Medical History:  Diagnosis Date  . Allergy   . Bronchitis, not specified as acute or chronic   . Chest pain, unspecified   . Diabetes mellitus   . Edema   . GERD (gastroesophageal reflux disease)   . Hyperlipidemia   . Hypertension   . Irritable bowel syndrome   . Other specified disorders of liver   . Palpitations    Past Surgical History:  Procedure Laterality Date  . APPENDECTOMY    .  DILATION AND CURETTAGE OF UTERUS     x 3  . WRIST SURGERY     Family History  Problem Relation Age of Onset  . Asthma Mother   . Arthritis Mother     rheumatism  . Allergies Sister   . Drug abuse Other   . Allergies Other   . Arthritis Other   . Cancer Brother     tear ducts.    History  Sexual Activity  . Sexual activity: Not Currently  . Birth control/ protection: Post-menopausal    Outpatient Encounter Prescriptions as of 05/25/2016  Medication Sig  . CALCIUM-MAGNESUIUM-ZINC 333-133-8.3 MG TABS Take 1 tablet by mouth 2 (two) times daily.   . clotrimazole-betamethasone (LOTRISONE) cream   . desloratadine (CLARINEX) 5 MG tablet TAKE ONE TABLET BY MOUTH ONCE DAILY (Patient taking differently: TAKE 5mg  by MOUTH ONCE DAILY)  . fish oil-omega-3 fatty acids 1000 MG capsule Take 1 capsule by mouth daily.   . fluticasone (FLONASE) 50 MCG/ACT nasal spray Place 2 sprays into both nostrils daily. (Patient taking differently: Place 2 sprays into both nostrils 2 (two) times daily as needed for allergies. )  . lansoprazole (PREVACID) 30 MG capsule TAKE ONE CAPSULE BY MOUTH ONCE DAILY (Patient taking differently: TAKE 30mg  BY MOUTH ONCE DAILY)  . latanoprost (XALATAN) 0.005 % ophthalmic solution Place 1 drop into both eyes at bedtime.   Marland Kitchen  metoprolol succinate (TOPROL-XL) 25 MG 24 hr tablet TAKE ONE TABLET BY MOUTH ONCE DAILY  . niacin (NIASPAN) 500 MG CR tablet Take 250-500 mg by mouth 2 (two) times daily. Take 250 mg in the am and 500 mg at bedtime  . selenium 50 MCG TABS tablet Take 50 mcg by mouth daily.   . Tetrahydrozoline HCl (VISION CLEAR OP) Apply 1 tablet to eye daily.  . Tetrahydrozoline HCl (VISION CLEAR OP) Apply 1 tablet to eye daily.  . traMADol (ULTRAM) 50 MG tablet Take 1 tablet (50 mg total) by mouth every 12 (twelve) hours as needed.  Marland Kitchen. albuterol (PROAIR HFA) 108 (90 BASE) MCG/ACT inhaler Inhale 1 puff into the lungs every 6 (six) hours as needed for wheezing or shortness of  breath (or with excersize).  . Cyanocobalamin (VITAMIN B-12) 1000 MCG SUBL Place 1 tablet under the tongue daily.    No facility-administered encounter medications on file as of 05/25/2016.     Activities of Daily Living In your present state of health, do you have any difficulty performing the following activities: 05/25/2016 11/09/2015  Hearing? N Y  Vision? N N  Difficulty concentrating or making decisions? N N  Walking or climbing stairs? N Y  Dressing or bathing? N N  Doing errands, shopping? N -  Preparing Food and eating ? N -  Using the Toilet? N -  In the past six months, have you accidently leaked urine? N -  Do you have problems with loss of bowel control? N -  Managing your Medications? N -  Managing your Finances? N -  Housekeeping or managing your Housekeeping? N -  Some recent data might be hidden    Patient Care Team: Etta Grandchildhomas L Jones, MD as PCP - General (Internal Medicine) Felecia ShellingBrent M Evans, DPM as Consulting Physician (Podiatry) Virgina OrganNicola Maria Kim, MD (Ophthalmology)    Assessment:    Physical assessment deferred to PCP.  Exercise Activities and Dietary recommendations Current Exercise Habits: The patient does not participate in regular exercise at present (maintain housework), Exercise limited by: respiratory conditions(s)   Diet (meal preparation, eat out, water intake, caffeinated beverages, dairy products, fruits and vegetables): Eats half meals out. Drinks powdered drink mix.    Breakfast: oatmeal, grits, bacon, milk Lunch: [place]key sandwich Dinner: fish, chicken, vegetables.    Discussed heart healthy diet and maintaining current activity level.   Goals      Patient Stated   . patient states (pt-stated)          "move to [place] or [place]", states for better health care.       Fall Risk Fall Risk  05/25/2016 04/07/2016 03/14/2015 03/09/2015 03/04/2013  Falls in the past year? No Yes No No No  Number falls in past yr: - 1 - - -  Injury with Fall? - No - -  -   Depression Screen PHQ 2/9 Scores 05/25/2016 03/14/2015 03/09/2015 03/04/2013  PHQ - 2 Score 0 0 0 0     Cognitive Function       Ad8 score reviewed for issues:  Issues making decisions:no  Less interest in hobbies / activities:no  Repeats questions, stories (family complaining):no  Trouble using ordinary gadgets (microwave, computer, phone):no  Forgets the month or year: no  Mismanaging finances: no  Remembering appts:no  Daily problems with thinking and/or memory:no Ad8 score is=0      Immunization History  Administered Date(s) Administered  . Influenza Split 01/23/2012  . Influenza Whole 02/05/2011  . Influenza,  High Dose Seasonal PF 02/21/2016  . Influenza,inj,Quad PF,36+ Mos 01/17/2013, 01/20/2014, 03/09/2015  . Pneumococcal Conjugate-13 03/04/2013  . Pneumococcal Polysaccharide-23 12/16/2009, 08/24/2015  . Tdap 03/04/2013   Screening Tests Health Maintenance  Topic Date Due  . FOOT EXAM  03/08/2016  . OPHTHALMOLOGY EXAM  06/16/2016  . HEMOGLOBIN A1C  08/21/2016  . URINE MICROALBUMIN  02/20/2017  . TETANUS/TDAP  03/05/2023  . INFLUENZA VACCINE  Addressed  . DEXA SCAN  Completed  . ZOSTAVAX  Addressed  . PNA vac Low Risk Adult  Completed      Plan:     Eat heart healthy diet (full of fruits, vegetables, whole grains, lean protein, water--limit salt, fat, and sugar intake) and increase physical activity as tolerated.  Continue doing brain stimulating activities (puzzles, reading, adult coloring books, staying active) to keep memory sharp.   Bring a copy of your advance directives to your next office visit.  Schedule mammogram and bone scan.  **FYI-Patient plans to move [place] this summer**  During the course of the visit the patient was educated and counseled about the following appropriate screening and preventive services:   Vaccines to include Pneumoccal, Influenza, Hepatitis B, Td, Zostavax, HCV  Cardiovascular Disease  Colorectal  cancer screening  Bone density screening  Diabetes screening  Glaucoma screening  Mammography/PAP  Nutrition counseling   Patient Instructions (the written plan) was given to the patient.   Alysia Penna, RN  05/25/2016  Medical screening examination/treatment/procedure(s) were performed by non-physician practitioner and as supervising physician I was immediately available for consultation/collaboration. I agree with above. Sanda Linger, MD

## 2016-05-24 NOTE — Progress Notes (Signed)
Pre visit review using our clinic review tool, if applicable. No additional management support is needed unless otherwise documented below in the visit note. 

## 2016-05-25 ENCOUNTER — Ambulatory Visit (INDEPENDENT_AMBULATORY_CARE_PROVIDER_SITE_OTHER): Payer: Medicare Other

## 2016-05-25 VITALS — BP 114/58 | HR 67 | Ht 61.0 in | Wt 140.0 lb

## 2016-05-25 DIAGNOSIS — Z0000 Encounter for general adult medical examination without abnormal findings: Secondary | ICD-10-CM | POA: Diagnosis not present

## 2016-05-25 DIAGNOSIS — Z1239 Encounter for other screening for malignant neoplasm of breast: Secondary | ICD-10-CM

## 2016-05-25 DIAGNOSIS — E2839 Other primary ovarian failure: Secondary | ICD-10-CM | POA: Diagnosis not present

## 2016-05-25 DIAGNOSIS — Z1231 Encounter for screening mammogram for malignant neoplasm of breast: Secondary | ICD-10-CM | POA: Diagnosis not present

## 2016-05-25 NOTE — Patient Instructions (Addendum)
Eat heart healthy diet (full of fruits, vegetables, whole grains, lean protein, water--limit salt, fat, and sugar intake) and increase physical activity as tolerated.  Continue doing brain stimulating activities (puzzles, reading, adult coloring books, staying active) to keep memory sharp.   Bring a copy of your advance directives to your next office visit.  Schedule mammogram and bone scan.   Fall Prevention in the Home Introduction Falls can cause injuries. They can happen to people of all ages. There are many things you can do to make your home safe and to help prevent falls. What can I do on the outside of my home?  Regularly fix the edges of walkways and driveways and fix any cracks.  Remove anything that might make you trip as you walk through a door, such as a raised step or threshold.  Trim any bushes or trees on the path to your home.  Use bright outdoor lighting.  Clear any walking paths of anything that might make someone trip, such as rocks or tools.  Regularly check to see if handrails are loose or broken. Make sure that both sides of any steps have handrails.  Any raised decks and porches should have guardrails on the edges.  Have any leaves, snow, or ice cleared regularly.  Use sand or salt on walking paths during winter.  Clean up any spills in your garage right away. This includes oil or grease spills. What can I do in the bathroom?  Use night lights.  Install grab bars by the toilet and in the tub and shower. Do not use towel bars as grab bars.  Use non-skid mats or decals in the tub or shower.  If you need to sit down in the shower, use a plastic, non-slip stool.  Keep the floor dry. Clean up any water that spills on the floor as soon as it happens.  Remove soap buildup in the tub or shower regularly.  Attach bath mats securely with double-sided non-slip rug tape.  Do not have throw rugs and other things on the floor that can make you trip. What  can I do in the bedroom?  Use night lights.  Make sure that you have a light by your bed that is easy to reach.  Do not use any sheets or blankets that are too big for your bed. They should not hang down onto the floor.  Have a firm chair that has side arms. You can use this for support while you get dressed.  Do not have throw rugs and other things on the floor that can make you trip. What can I do in the kitchen?  Clean up any spills right away.  Avoid walking on wet floors.  Keep items that you use a lot in easy-to-reach places.  If you need to reach something above you, use a strong step stool that has a grab bar.  Keep electrical cords out of the way.  Do not use floor polish or wax that makes floors slippery. If you must use wax, use non-skid floor wax.  Do not have throw rugs and other things on the floor that can make you trip. What can I do with my stairs?  Do not leave any items on the stairs.  Make sure that there are handrails on both sides of the stairs and use them. Fix handrails that are broken or loose. Make sure that handrails are as long as the stairways.  Check any carpeting to make sure that it is  firmly attached to the stairs. Fix any carpet that is loose or worn.  Avoid having throw rugs at the top or bottom of the stairs. If you do have throw rugs, attach them to the floor with carpet tape.  Make sure that you have a light switch at the top of the stairs and the bottom of the stairs. If you do not have them, ask someone to add them for you. What else can I do to help prevent falls?  Wear shoes that:  Do not have high heels.  Have rubber bottoms.  Are comfortable and fit you well.  Are closed at the toe. Do not wear sandals.  If you use a stepladder:  Make sure that it is fully opened. Do not climb a closed stepladder.  Make sure that both sides of the stepladder are locked into place.  Ask someone to hold it for you, if  possible.  Clearly mark and make sure that you can see:  Any grab bars or handrails.  First and last steps.  Where the edge of each step is.  Use tools that help you move around (mobility aids) if they are needed. These include:  Canes.  Walkers.  Scooters.  Crutches.  Turn on the lights when you go into a dark area. Replace any light bulbs as soon as they burn out.  Set up your furniture so you have a clear path. Avoid moving your furniture around.  If any of your floors are uneven, fix them.  If there are any pets around you, be aware of where they are.  Review your medicines with your doctor. Some medicines can make you feel dizzy. This can increase your chance of falling. Ask your doctor what other things that you can do to help prevent falls. This information is not intended to replace advice given to you by your health care provider. Make sure you discuss any questions you have with your health care provider. Document Released: 02/11/2009 Document Revised: 09/23/2015 Document Reviewed: 05/22/2014  2017 [employer]  Health Maintenance, Female Introduction Adopting a healthy lifestyle and getting preventive care can go a long way to promote health and wellness. Talk with your health care provider about what schedule of regular examinations is right for you. This is a good chance for you to check in with your provider about disease prevention and staying healthy. In between checkups, there are plenty of things you can do on your own. Experts have done a lot of research about which lifestyle changes and preventive measures are most likely to keep you healthy. Ask your health care provider for more information. Weight and diet Eat a healthy diet  Be sure to include plenty of vegetables, fruits, low-fat dairy products, and lean protein.  Do not eat a lot of foods high in solid fats, added sugars, or salt.  Get regular exercise. This is one of the most important things you  can do for your health.  Most adults should exercise for at least 150 minutes each week. The exercise should increase your heart rate and make you sweat (moderate-intensity exercise).  Most adults should also do strengthening exercises at least twice a week. This is in addition to the moderate-intensity exercise. Maintain a healthy weight  Body mass index (BMI) is a measurement that can be used to identify possible weight problems. It estimates body fat based on height and weight. Your health care provider can help determine your BMI and help you achieve or maintain a healthy  weight.  For females 65 years of age and older:  A BMI below 18.5 is considered underweight.  A BMI of 18.5 to 24.9 is normal.  A BMI of 25 to 29.9 is considered overweight.  A BMI of 30 and above is considered obese. Watch levels of cholesterol and blood lipids  You should start having your blood tested for lipids and cholesterol at 81 years of age, then have this test every 5 years.  You may need to have your cholesterol levels checked more often if:  Your lipid or cholesterol levels are high.  You are older than 81 years of age.  You are at high risk for heart disease. Cancer screening Lung Cancer  Lung cancer screening is recommended for adults 75-39 years old who are at high risk for lung cancer because of a history of smoking.  A yearly low-dose CT scan of the lungs is recommended for people who:  Currently smoke.  Have quit within the past 15 years.  Have at least a 30-pack-year history of smoking. A pack year is smoking an average of one pack of cigarettes a day for 1 year.  Yearly screening should continue until it has been 15 years since you quit.  Yearly screening should stop if you develop a health problem that would prevent you from having lung cancer treatment. Breast Cancer  Practice breast self-awareness. This means understanding how your breasts normally appear and feel.  It also  means doing regular breast self-exams. Let your health care provider know about any changes, no matter how small.  If you are in your 20s or 30s, you should have a clinical breast exam (CBE) by a health care provider every 1-3 years as part of a regular health exam.  If you are 11 or older, have a CBE every year. Also consider having a breast X-ray (mammogram) every year.  If you have a family history of breast cancer, talk to your health care provider about genetic screening.  If you are at high risk for breast cancer, talk to your health care provider about having an MRI and a mammogram every year.  Breast cancer gene (BRCA) assessment is recommended for women who have family members with BRCA-related cancers. BRCA-related cancers include:  Breast.  Ovarian.  Tubal.  Peritoneal cancers.  Results of the assessment will determine the need for genetic counseling and BRCA1 and BRCA2 testing. Cervical Cancer  Your health care provider may recommend that you be screened regularly for cancer of the pelvic organs (ovaries, uterus, and vagina). This screening involves a pelvic examination, including checking for microscopic changes to the surface of your cervix (Pap test). You may be encouraged to have this screening done every 3 years, beginning at age 31.  For women ages 75-65, health care providers may recommend pelvic exams and Pap testing every 3 years, or they may recommend the Pap and pelvic exam, combined with testing for human papilloma virus (HPV), every 5 years. Some types of HPV increase your risk of cervical cancer. Testing for HPV may also be done on women of any age with unclear Pap test results.  Other health care providers may not recommend any screening for nonpregnant women who are considered low risk for pelvic cancer and who do not have symptoms. Ask your health care provider if a screening pelvic exam is right for you.  If you have had past treatment for cervical cancer or  a condition that could lead to cancer, you need Pap tests  and screening for cancer for at least 20 years after your treatment. If Pap tests have been discontinued, your risk factors (such as having a new sexual partner) need to be reassessed to determine if screening should resume. Some women have medical problems that increase the chance of getting cervical cancer. In these cases, your health care provider may recommend more frequent screening and Pap tests. Colorectal Cancer  This type of cancer can be detected and often prevented.  Routine colorectal cancer screening usually begins at 81 years of age and continues through 81 years of age.  Your health care provider may recommend screening at an earlier age if you have risk factors for colon cancer.  Your health care provider may also recommend using home test kits to check for hidden blood in the stool.  A small camera at the end of a tube can be used to examine your colon directly (sigmoidoscopy or colonoscopy). This is done to check for the earliest forms of colorectal cancer.  Routine screening usually begins at age 35.  Direct examination of the colon should be repeated every 5-10 years through 81 years of age. However, you may need to be screened more often if early forms of precancerous polyps or small growths are found. Skin Cancer  Check your skin from head to toe regularly.  Tell your health care provider about any new moles or changes in moles, especially if there is a change in a mole's shape or color.  Also tell your health care provider if you have a mole that is larger than the size of a pencil eraser.  Always use sunscreen. Apply sunscreen liberally and repeatedly throughout the day.  Protect yourself by wearing long sleeves, pants, a wide-brimmed hat, and sunglasses whenever you are outside. Heart disease, diabetes, and high blood pressure  High blood pressure causes heart disease and increases the risk of stroke. High  blood pressure is more likely to develop in:  People who have blood pressure in the high end of the normal range (130-139/85-89 mm Hg).  People who are overweight or obese.  People who are African American.  If you are 53-45 years of age, have your blood pressure checked every 3-5 years. If you are 59 years of age or older, have your blood pressure checked every year. You should have your blood pressure measured twice-once when you are at a hospital or clinic, and once when you are not at a hospital or clinic. Record the average of the two measurements. To check your blood pressure when you are not at a hospital or clinic, you can use:  An automated blood pressure machine at a pharmacy.  A home blood pressure monitor.  If you are between 12 years and 61 years old, ask your health care provider if you should take aspirin to prevent strokes.  Have regular diabetes screenings. This involves taking a blood sample to check your fasting blood sugar level.  If you are at a normal weight and have a low risk for diabetes, have this test once every three years after 81 years of age.  If you are overweight and have a high risk for diabetes, consider being tested at a younger age or more often. Preventing infection Hepatitis B  If you have a higher risk for hepatitis B, you should be screened for this virus. You are considered at high risk for hepatitis B if:  You were born in a country where hepatitis B is common. Ask your health care  provider which countries are considered high risk.  Your parents were born in a high-risk country, and you have not been immunized against hepatitis B (hepatitis B vaccine).  You have HIV or AIDS.  You use needles to inject street drugs.  You live with someone who has hepatitis B.  You have had sex with someone who has hepatitis B.  You get hemodialysis treatment.  You take certain medicines for conditions, including cancer, organ transplantation, and  autoimmune conditions. Hepatitis C  Blood testing is recommended for:  Everyone born from [birth year] through 1963.  Anyone with known risk factors for hepatitis C. Sexually transmitted infections (STIs)  You should be screened for sexually transmitted infections (STIs) including gonorrhea and chlamydia if:  You are sexually active and are younger than 81 years of age.  You are older than 81 years of age and your health care provider tells you that you are at risk for this type of infection.  Your sexual activity has changed since you were last screened and you are at an increased risk for chlamydia or gonorrhea. Ask your health care provider if you are at risk.  If you do not have HIV, but are at risk, it may be recommended that you take a prescription medicine daily to prevent HIV infection. This is called pre-exposure prophylaxis (PrEP). You are considered at risk if:  You are sexually active and do not regularly use condoms or know the HIV status of your partner(s).  You take drugs by injection.  You are sexually active with a partner who has HIV. Talk with your health care provider about whether you are at high risk of being infected with HIV. If you choose to begin PrEP, you should first be tested for HIV. You should then be tested every 3 months for as long as you are taking PrEP. Pregnancy  If you are premenopausal and you may become pregnant, ask your health care provider about preconception counseling.  If you may become pregnant, take 400 to 800 micrograms (mcg) of folic acid every day.  If you want to prevent pregnancy, talk to your health care provider about birth control (contraception). Osteoporosis and menopause  Osteoporosis is a disease in which the bones lose minerals and strength with aging. This can result in serious bone fractures. Your risk for osteoporosis can be identified using a bone density scan.  If you are 56 years of age or older, or if you are at risk for  osteoporosis and fractures, ask your health care provider if you should be screened.  Ask your health care provider whether you should take a calcium or vitamin D supplement to lower your risk for osteoporosis.  Menopause may have certain physical symptoms and risks.  Hormone replacement therapy may reduce some of these symptoms and risks. Talk to your health care provider about whether hormone replacement therapy is right for you. Follow these instructions at home:  Schedule regular health, dental, and eye exams.  Stay current with your immunizations.  Do not use any tobacco products including cigarettes, chewing tobacco, or electronic cigarettes.  If you are pregnant, do not drink alcohol.  If you are breastfeeding, limit how much and how often you drink alcohol.  Limit alcohol intake to no more than 1 drink per day for nonpregnant women. One drink equals 12 ounces of beer, 5 ounces of wine, or 1 ounces of hard liquor.  Do not use street drugs.  Do not share needles.  Ask your health care  provider for help if you need support or information about quitting drugs.  Tell your health care provider if you often feel depressed.  Tell your health care provider if you have ever been abused or do not feel safe at home. This information is not intended to replace advice given to you by your health care provider. Make sure you discuss any questions you have with your health care provider. Document Released: 10/31/2010 Document Revised: 09/23/2015 Document Reviewed: 01/19/2015  2017 [employer]

## 2016-05-26 ENCOUNTER — Ambulatory Visit: Payer: Medicare Other | Attending: Internal Medicine | Admitting: Physical Therapy

## 2016-05-26 ENCOUNTER — Encounter: Payer: Self-pay | Admitting: Physical Therapy

## 2016-05-26 DIAGNOSIS — M25511 Pain in right shoulder: Secondary | ICD-10-CM | POA: Diagnosis not present

## 2016-05-26 DIAGNOSIS — M546 Pain in thoracic spine: Secondary | ICD-10-CM | POA: Diagnosis not present

## 2016-05-26 DIAGNOSIS — M542 Cervicalgia: Secondary | ICD-10-CM | POA: Insufficient documentation

## 2016-05-26 DIAGNOSIS — M62838 Other muscle spasm: Secondary | ICD-10-CM | POA: Insufficient documentation

## 2016-05-26 DIAGNOSIS — R293 Abnormal posture: Secondary | ICD-10-CM
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
## 2016-05-29 ENCOUNTER — Ambulatory Visit: Payer: Medicare Other | Admitting: Physical Therapy

## 2016-05-29 DIAGNOSIS — M25511 Pain in right shoulder: Secondary | ICD-10-CM | POA: Diagnosis not present

## 2016-05-29 DIAGNOSIS — R293 Abnormal posture: Secondary | ICD-10-CM

## 2016-05-29 DIAGNOSIS — M542 Cervicalgia: Secondary | ICD-10-CM | POA: Diagnosis not present

## 2016-05-29 DIAGNOSIS — M546 Pain in thoracic spine: Secondary | ICD-10-CM

## 2016-05-29 DIAGNOSIS — M62838 Other muscle spasm: Secondary | ICD-10-CM | POA: Diagnosis not present

## 2016-05-29 NOTE — Therapy (Signed)
[place], [place], [address] Phone: [phone]   Fax:  [phone]  Physical Therapy Treatment  Patient Details  Name: Brianna Mcdonald MRN: [id number] Date of Birth: 1933-02-26 Referring Provider: Janith Lima, MD  Encounter Date: 05/29/2016      PT End of Session - 05/29/16 1832    Visit Number 2   Number of Visits 13   Date for PT Re-Evaluation 07/07/16   PT Start Time 2122   PT Stop Time 0930   PT Time Calculation (min) 43 min   Activity Tolerance Patient tolerated treatment well   Behavior During Therapy [facility] for tasks assessed/performed      Past Medical History:  Diagnosis Date  . Allergy   . Bronchitis, not specified as acute or chronic   . Chest pain, unspecified   . Diabetes mellitus   . Edema   . GERD (gastroesophageal reflux disease)   . Hyperlipidemia   . Hypertension   . Irritable bowel syndrome   . Other specified disorders of liver   . Palpitations     Past Surgical History:  Procedure Laterality Date  . APPENDECTOMY    . DILATION AND CURETTAGE OF UTERUS     x 3  . WRIST SURGERY      There were no vitals filed for this visit.      Subjective Assessment - 05/29/16 0850    Subjective Tramadol kept her up last night.  pain increased last night.  I have been trying to move more.  I have been trying to skip some of my medication.   Currently in Pain? Yes   Pain Score 0-No pain  increases with use,  no number given.   Pain Location Neck   Pain Descriptors / Indicators --  hot,  middlle finger   Pain Radiating Towards right arm and hand   Pain Frequency Intermittent   Aggravating Factors  opening water,  making a fist   Pain Relieving Factors medication   Effect of Pain on Daily Activities needs help to open doors,  open water bottles.                          [facility] Adult PT Treatment/Exercise - 05/29/16 0001      Self-Care   Self-Care --   sitting and walking posture     Neck Exercises: Machines for Strengthening   Other Machines for Strengthening Nu step L1 X 5 minutes, smaller motions initially then she was able to move more.       Neck Exercises: Seated   Shoulder Shrugs 5 reps   Shoulder Shrugs Limitations also shoulder circles   Other Seated Exercise scapular retraction  5 x 5 seconds     Manual Therapy   Manual therapy comments hypersensitive or guarding present.  not tolerated (light palpation in area of pain)     Neck Exercises: Stretches   Upper Trapezius Stretch 1 rep  right stretch, stopped due to pain                PT Education - 05/29/16 1832    Education provided Yes   Education Details posture   Person(s) Educated Patient   Methods Explanation;Demonstration;Verbal cues   Comprehension Verbalized understanding;Returned demonstration;Need further instruction          PT Short Term Goals - 05/26/16 1220      PT SHORT TERM GOAL #1   Title  She will be </= Min A with inital HEP given (06/16/2016)   Time 3   Period Weeks   Status New     PT SHORT TERM GOAL #2   Title she will be able to verbalize and demo proper posture and lifting/ carrying mechanics to prevent and reduce neck/ thoracic pain (06/16/2016)   Time 3   Period Weeks   Status New     PT SHORT TERM GOAL #3   Title pt will exhibit decreased upper trap and thoracic paraspinal spasm to reduce pain to </= 4/10  and promote mobility (06/16/2016)   Time 3   Period Weeks   Status New           PT Long Term Goals - 05/26/16 1222      PT LONG TERM GOAL #1   Title pt will be </= min A with all HEP given as of last visit (07/07/2016)   Time 6   Period Weeks   Status New     PT LONG TERM GOAL #2   Title she will increase her cervical mobility by >/= 10 degrees in all planes to promote safety with ADLS (09/30/6071)   Time 6   Status New     PT LONG TERM GOAL #3   Title she will demonstrate decrease muscle tightnes in the R  shoulder and neck to reduce pain to <=1/10 pain with ADLS (10/29/624)   Time 6   Period Weeks   Status New     PT LONG TERM GOAL #4   Title she will improve her FOTO score to </= 40 % limited to demonstrate improvement in functionat at discharge (07/07/2016)   Time 6   Period Weeks   Status New     PT LONG TERM GOAL #5   Title she will be able to lift and carry >/= 8# from floor<>waist and from waist<>overhead with </= 2/10 pain to help with functional lifting activites required for ADLS (01/02/8545)   Time 6   Period Weeks   Status New               Plan - 05/29/16 1832    Clinical Impression Statement Patient has not noticed any changes in her pain so far.  No new HEP issued today as she needs heavy cues. Sitting posture improves with cues, lumbar support and foot stool.  No new goals met yet.   PT Next Visit Plan assess/ review HEP,  Assess shoulder mobility, shoulder special tests?, manual for upper trap/ thoracic paraspinals, rib mobs? modalities PRN (extra time/ effort may be needed to keep pt on task)   PT Home Exercise Plan seated thoracic mobility, chin tucks, upper trap stretch   Consulted and Agree with Plan of Care Patient      Patient will benefit from skilled therapeutic intervention in order to improve the following deficits and impairments:  Pain, Improper body mechanics, Postural dysfunction, Decreased endurance, Decreased activity tolerance, Increased fascial restricitons, Decreased range of motion, Decreased mobility  Visit Diagnosis: Cervicalgia  Other muscle spasm  Right shoulder pain, unspecified chronicity  Pain in thoracic spine  Abnormal posture     Problem List Patient Active Problem List   Diagnosis Date Noted  . Acute right-sided thoracic back pain 05/15/2016  . Routine general medical examination at a health care facility 03/14/2015  . Patient noncompliant with statin medication 10/30/2012  . Osteopenia, senile 02/13/2012  . Cervical  radiculitis 01/23/2012  . Left thyroid nodule 07/12/2011  .  Type II diabetes mellitus with manifestations ([place]) 04/07/2011  . Hyperlipidemia with target LDL less than 100 12/26/2010  . Sleep apnea 08/23/2010  . Essential hypertension, benign 09/23/2008  . Allergic rhinitis 10/29/2007  . GERD 10/29/2007  . Irritable bowel syndrome 09/27/2007  . HEPATIC CYST 09/27/2007    Calel Pisarski PTA 05/29/2016, 6:36 PM  [facility] [address] [place], [place], [address] Phone: [phone]   Fax:  [phone]  Name: Brianna Mcdonald MRN: [id number] Date of Birth: [birth year]

## 2016-06-01 ENCOUNTER — Ambulatory Visit: Payer: Medicare Other | Attending: Internal Medicine | Admitting: Physical Therapy

## 2016-06-01 DIAGNOSIS — M25511 Pain in right shoulder: Secondary | ICD-10-CM | POA: Diagnosis not present

## 2016-06-01 DIAGNOSIS — M546 Pain in thoracic spine: Secondary | ICD-10-CM | POA: Insufficient documentation

## 2016-06-01 DIAGNOSIS — R293 Abnormal posture: Secondary | ICD-10-CM

## 2016-06-01 DIAGNOSIS — M62838 Other muscle spasm: Secondary | ICD-10-CM | POA: Diagnosis not present

## 2016-06-01 DIAGNOSIS — M542 Cervicalgia: Secondary | ICD-10-CM | POA: Insufficient documentation

## 2016-06-01 NOTE — Therapy (Signed)
[facility] Outpatient Rehabilitation [facility] [address] [place], [place], [address] Phone: [phone]   Fax:  [phone]  Physical Therapy Treatment  Patient Details  Name: Brianna Mcdonald MRN: [id number] Date of Birth: 19-Nov-1932 Referring Provider: Etta Grandchild, MD  Encounter Date: 06/01/2016      PT End of Session - 06/01/16 1058    Visit Number 3   Number of Visits 13   Date for PT Re-Evaluation 07/07/16   PT Start Time 1000   PT Stop Time 1055   PT Time Calculation (min) 55 min   Activity Tolerance Patient tolerated treatment well   Behavior During Therapy [facility] for tasks assessed/performed      Past Medical History:  Diagnosis Date  . Allergy   . Bronchitis, not specified as acute or chronic   . Chest pain, unspecified   . Diabetes mellitus   . Edema   . GERD (gastroesophageal reflux disease)   . Hyperlipidemia   . Hypertension   . Irritable bowel syndrome   . Other specified disorders of liver   . Palpitations     Past Surgical History:  Procedure Laterality Date  . APPENDECTOMY    . DILATION AND CURETTAGE OF UTERUS     x 3  . WRIST SURGERY      There were no vitals filed for this visit.      Subjective Assessment - 06/01/16 1000    Subjective Sometimes she gets cramps in calf and thigh if she sit too long or walks too long.  It does not happen  every day.  She felt like she felt a little better after the last visit.    Currently in Pain? No/denies   Multiple Pain Sites --  Right leg "catches" with walking,  Doed not know she has it             [facility] PT Assessment - 06/01/16 0001      AROM   Cervical Flexion able to touch chest with chin   Cervical - Right Rotation 53   Cervical - Left Rotation 53     Strength   Overall Strength --  grip LBS: LT  57,32,20,  RT   25,42,70                     [facility] Adult PT Treatment/Exercise - 06/01/16 0001      Neck Exercises: Machines for Strengthening   Other  Machines for Strengthening Nu step L1 X 7 minutes, smaller motions initially then she was able to move more.       Neck Exercises: Seated   Cervical Rotation 5 reps   Cervical Rotation Limitations 53 both   Lateral Flexion Limitations 5  reps pulling noted  HEP   Shoulder Shrugs 10 reps   Shoulder Shrugs Limitations also shoulder circles   Shoulder Rolls 10 reps   Shoulder Flexion 5 reps  right   Shoulder Flexion Limitations Fatigue   Upper Extremity D1 5 reps   UE D1 Limitations AROM, Fatigue   Upper Extremity D2 5 reps   UE D2 Limitations AROM Fatigue   Other Seated Exercise Bicep/tricep band , red 5 X each, HEP     Other Seated Exercise neck flexion stretch 5 x 5 seconds.   HEP     Moist Heat Therapy   Number Minutes Moist Heat 15 Minutes   Moist Heat Location Cervical  sitting  PT Education - 06/01/16 1056    Education provided Yes   Education Details HEP   Person(s) Educated Patient   Methods Explanation;Demonstration;Tactile cues;Verbal cues;Handout   Comprehension Verbalized understanding;Returned demonstration;Need further instruction          PT Short Term Goals - 06/01/16 1101      PT SHORT TERM GOAL #1   Title She will be </= Min A with inital HEP given (06/16/2016)   Baseline cues   Time 3   Period Weeks   Status On-going     PT SHORT TERM GOAL #2   Title she will be able to verbalize and demo proper posture and lifting/ carrying mechanics to prevent and reduce neck/ thoracic pain (06/16/2016)   Baseline working with sitting better   Time 3   Period Weeks   Status On-going     PT SHORT TERM GOAL #3   Title pt will exhibit decreased upper trap and thoracic paraspinal spasm to reduce pain to </= 4/10  and promote mobility (06/16/2016)   Baseline pain varies,     Time 3   Period Weeks   Status On-going           PT Long Term Goals - 05/26/16 1222      PT LONG TERM GOAL #1   Title pt will be </= min A with all HEP given  as of last visit (07/07/2016)   Time 6   Period Weeks   Status New     PT LONG TERM GOAL #2   Title she will increase her cervical mobility by >/= 10 degrees in all planes to promote safety with ADLS (07/07/2016)   Time 6   Status New     PT LONG TERM GOAL #3   Title she will demonstrate decrease muscle tightnes in the R shoulder and neck to reduce pain to <=1/10 pain with ADLS (07/07/2016)   Time 6   Period Weeks   Status New     PT LONG TERM GOAL #4   Title she will improve her FOTO score to </= 40 % limited to demonstrate improvement in functionat at discharge (07/07/2016)   Time 6   Period Weeks   Status New     PT LONG TERM GOAL #5   Title she will be able to lift and carry >/= 8# from floor<>waist and from waist<>overhead with </= 2/10 pain to help with functional lifting activites required for ADLS (07/07/2016)   Time 6   Period Weeks   Status New               Plan - 06/01/16 1059    Clinical Impression Statement Neck Rotation improved . See flow sheet.  Patient was able to be progressed to new home exercises today.  No pain increased at enf of session,     PT Next Visit Plan assess/ review HEP,  Assess shoulder mobility, shoulder special tests?, manual for upper trap/ thoracic paraspinals, rib mobs? modalities PRN (extra time/ effort may be needed to keep pt on task)   PT Home Exercise Plan seated thoracic mobility, chin tucks, upper trap stretch,  elbow bands,  cervical AROM   Consulted and Agree with Plan of Care Patient      Patient will benefit from skilled therapeutic intervention in order to improve the following deficits and impairments:  Pain, Improper body mechanics, Postural dysfunction, Decreased endurance, Decreased activity tolerance, Increased fascial restricitons, Decreased range of motion, Decreased mobility  Visit Diagnosis: Cervicalgia  Other muscle spasm  Right shoulder pain, unspecified chronicity  Pain in thoracic spine  Abnormal  posture     Problem List Patient Active Problem List   Diagnosis Date Noted  . Acute right-sided thoracic back pain 05/15/2016  . Routine general medical examination at a health care facility 03/14/2015  . Patient noncompliant with statin medication 10/30/2012  . Osteopenia, senile 02/13/2012  . Cervical radiculitis 01/23/2012  . Left thyroid nodule 07/12/2011  . Type II diabetes mellitus with manifestations (HCC) 04/07/2011  . Hyperlipidemia with target LDL less than 100 12/26/2010  . Sleep apnea 08/23/2010  . Essential hypertension, benign 09/23/2008  . Allergic rhinitis 10/29/2007  . GERD 10/29/2007  . Irritable bowel syndrome 09/27/2007  . HEPATIC CYST 09/27/2007    HARRIS,KAREN PTA 06/01/2016, 11:02 AM  [facility] [address] [place], [place], [address] Phone: [phone]   Fax:  [phone]  Name: Brianna Mcdonald MRN: [id number] Date of Birth: [birth year]

## 2016-06-01 NOTE — Patient Instructions (Signed)
Issued from exercise drawer: Elbow flexion extension with band 1-2 x a day 5 x each Neck AROM 1-2 x a day 5 X each with 0 to 5 second holds, painfree

## 2016-06-05 ENCOUNTER — Ambulatory Visit: Payer: Medicare Other | Admitting: Physical Therapy

## 2016-06-05 DIAGNOSIS — M25511 Pain in right shoulder: Secondary | ICD-10-CM

## 2016-06-05 DIAGNOSIS — M546 Pain in thoracic spine: Secondary | ICD-10-CM | POA: Diagnosis not present

## 2016-06-05 DIAGNOSIS — M62838 Other muscle spasm: Secondary | ICD-10-CM

## 2016-06-05 DIAGNOSIS — R293 Abnormal posture: Secondary | ICD-10-CM

## 2016-06-05 DIAGNOSIS — M542 Cervicalgia: Secondary | ICD-10-CM | POA: Diagnosis not present

## 2016-06-05 NOTE — Therapy (Signed)
[facility] [address] [place], [place], [address] Phone: [phone]   Fax:  [phone]  Physical Therapy Treatment  Patient Details  Name: STARLA DELLER MRN: [id number] Date of Birth: [birth year] Referring Provider: Etta Grandchild, MD  Encounter Date: 06/05/2016      PT End of Session - 06/05/16 1318    Visit Number 4   Number of Visits 13   Date for PT Re-Evaluation 07/07/16   PT Start Time 0913   PT Stop Time 0945   PT Time Calculation (min) 32 min   Activity Tolerance Patient tolerated treatment well   Behavior During Therapy [facility] for tasks assessed/performed      Past Medical History:  Diagnosis Date  . Allergy   . Bronchitis, not specified as acute or chronic   . Chest pain, unspecified   . Diabetes mellitus   . Edema   . GERD (gastroesophageal reflux disease)   . Hyperlipidemia   . Hypertension   . Irritable bowel syndrome   . Other specified disorders of liver   . Palpitations     Past Surgical History:  Procedure Laterality Date  . APPENDECTOMY    . DILATION AND CURETTAGE OF UTERUS     x 3  . WRIST SURGERY      There were no vitals filed for this visit.      Subjective Assessment - 06/05/16 1314    Subjective Patient late for appointment.  she took a cab and did not have the phone number to call and cancel.  neck moving better.   Currently in Pain? Yes   Pain Score --  no number given when asked   Pain Location Neck   Pain Orientation Right   Pain Descriptors / Indicators Aching   Pain Radiating Towards righ arm/hand   Aggravating Factors  turning head   Pain Relieving Factors medication                         [facility] Adult PT Treatment/Exercise - 06/05/16 0001      Neck Exercises: Machines for Strengthening   Other Machines for Strengthening L3 4 minutes UE/LE     Moist Heat Therapy   Number Minutes Moist Heat 15 Minutes   Moist Heat Location Cervical  extra  layers.     Manual Therapy   Manual therapy comments soft tissue work peri scapula and shoulder, neck right.  Shoulder edematous when compared to left.  Patient less sensitive to soft tissue work today                  PT Short Term Goals - 06/01/16 1101      PT SHORT TERM GOAL #1   Title She will be </= Min A with inital HEP given (06/16/2016)   Baseline cues   Time 3   Period Weeks   Status On-going     PT SHORT TERM GOAL #2   Title she will be able to verbalize and demo proper posture and lifting/ carrying mechanics to prevent and reduce neck/ thoracic pain (06/16/2016)   Baseline working with sitting better   Time 3   Period Weeks   Status On-going     PT SHORT TERM GOAL #3   Title pt will exhibit decreased upper trap and thoracic paraspinal spasm to reduce pain to </= 4/10  and promote mobility (06/16/2016)   Baseline pain varies,     Time 3  Period Weeks   Status On-going           PT Long Term Goals - 05/26/16 1222      PT LONG TERM GOAL #1   Title pt will be </= min A with all HEP given as of last visit (07/07/2016)   Time 6   Period Weeks   Status New     PT LONG TERM GOAL #2   Title she will increase her cervical mobility by >/= 10 degrees in all planes to promote safety with ADLS (07/07/2016)   Time 6   Status New     PT LONG TERM GOAL #3   Title she will demonstrate decrease muscle tightnes in the R shoulder and neck to reduce pain to <=1/10 pain with ADLS (07/07/2016)   Time 6   Period Weeks   Status New     PT LONG TERM GOAL #4   Title she will improve her FOTO score to </= 40 % limited to demonstrate improvement in functionat at discharge (07/07/2016)   Time 6   Period Weeks   Status New     PT LONG TERM GOAL #5   Title she will be able to lift and carry >/= 8# from floor<>waist and from waist<>overhead with </= 2/10 pain to help with functional lifting activites required for ADLS (07/07/2016)   Time 6   Period Weeks   Status New              Patient will benefit from skilled therapeutic intervention in order to improve the following deficits and impairments:     Visit Diagnosis: Cervicalgia  Other muscle spasm  Right shoulder pain, unspecified chronicity  Pain in thoracic spine  Abnormal posture     Problem List Patient Active Problem List   Diagnosis Date Noted  . Acute right-sided thoracic back pain 05/15/2016  . Routine general medical examination at a health care facility 03/14/2015  . Patient noncompliant with statin medication 10/30/2012  . Osteopenia, senile 02/13/2012  . Cervical radiculitis 01/23/2012  . Left thyroid nodule 07/12/2011  . Type II diabetes mellitus with manifestations (HCC) 04/07/2011  . Hyperlipidemia with target LDL less than 100 12/26/2010  . Sleep apnea 08/23/2010  . Essential hypertension, benign 09/23/2008  . Allergic rhinitis 10/29/2007  . GERD 10/29/2007  . Irritable bowel syndrome 09/27/2007  . HEPATIC CYST 09/27/2007    Shawon Denzer PTA 06/05/2016, 1:20 PM  [facility] [address] [place], [place], [address] Phone: [phone]   Fax:  [phone]  Name: Arlice ColtRuby M Palmer MRN: [id number] Date of Birth: 1933-04-27

## 2016-06-08 ENCOUNTER — Ambulatory Visit: Payer: Medicare Other | Admitting: Physical Therapy

## 2016-06-08 DIAGNOSIS — M546 Pain in thoracic spine: Secondary | ICD-10-CM | POA: Diagnosis not present

## 2016-06-08 DIAGNOSIS — M62838 Other muscle spasm: Secondary | ICD-10-CM | POA: Diagnosis not present

## 2016-06-08 DIAGNOSIS — R293 Abnormal posture: Secondary | ICD-10-CM

## 2016-06-08 DIAGNOSIS — M25511 Pain in right shoulder: Secondary | ICD-10-CM

## 2016-06-08 DIAGNOSIS — M542 Cervicalgia: Secondary | ICD-10-CM | POA: Diagnosis not present

## 2016-06-08 NOTE — Therapy (Signed)
[facility] Outpatient Rehabilitation [facility] [address] [place], [place], [address] Phone: [phone]   Fax:  [phone]  Physical Therapy Treatment  Patient Details  Name: Brianna Mcdonald MRN: [id number] Date of Birth: 13-Feb-1933 Referring Provider: Etta Grandchild, MD  Encounter Date: 06/08/2016      PT End of Session - 06/08/16 0926    Visit Number 5   Number of Visits 13   Date for PT Re-Evaluation 07/07/16   PT Start Time 0839   PT Stop Time 0935   PT Time Calculation (min) 56 min   Activity Tolerance Patient tolerated treatment well   Behavior During Therapy [facility] for tasks assessed/performed      Past Medical History:  Diagnosis Date  . Allergy   . Bronchitis, not specified as acute or chronic   . Chest pain, unspecified   . Diabetes mellitus   . Edema   . GERD (gastroesophageal reflux disease)   . Hyperlipidemia   . Hypertension   . Irritable bowel syndrome   . Other specified disorders of liver   . Palpitations     Past Surgical History:  Procedure Laterality Date  . APPENDECTOMY    . DILATION AND CURETTAGE OF UTERUS     x 3  . WRIST SURGERY      There were no vitals filed for this visit.      Subjective Assessment - 06/08/16 0836    Subjective "I am feeling really tired today, but I wanted to be here early this morning. I still have a cramp in my R calf."   Currently in Pain? No/denies   Pain Score 0-No pain   Pain Location Neck   Pain Orientation Right                         [facility] Adult PT Treatment/Exercise - 06/08/16 0001      Neck Exercises: Machines for Strengthening   Other Machines for Strengthening L3 x 7 min UE/LE     Neck Exercises: Seated   Shoulder Flexion 5 reps  on R   Shoulder Flexion Limitations Fatigue   Upper Extremity D1 10 reps  2 sets no weight   UE D1 Limitations AROM, Fatigue   Upper Extremity D2 10 reps   UE D2 Limitations AROM Fatigue   Other Seated Exercise rows 2 x15  with yellow band  cueing for proper posture     Neck Exercises: Supine   Other Supine Exercise decompression 5 minutes (3 pillows)   Other Supine Exercise ceiling punches on R 2 x 15     Moist Heat Therapy   Number Minutes Moist Heat 15 Minutes   Moist Heat Location Cervical     Manual Therapy   Manual Therapy Soft tissue mobilization;Myofascial release   Soft tissue mobilization STM at R upper trap following manual trigger point release   Myofascial Release manual trigger point release x 4 at R upper trap                  PT Short Term Goals - 06/01/16 1101      PT SHORT TERM GOAL #1   Title She will be </= Min A with inital HEP given (06/16/2016)   Baseline cues   Time 3   Period Weeks   Status On-going     PT SHORT TERM GOAL #2   Title she will be able to verbalize and demo proper posture and lifting/ carrying  mechanics to prevent and reduce neck/ thoracic pain (06/16/2016)   Baseline working with sitting better   Time 3   Period Weeks   Status On-going     PT SHORT TERM GOAL #3   Title pt will exhibit decreased upper trap and thoracic paraspinal spasm to reduce pain to </= 4/10  and promote mobility (06/16/2016)   Baseline pain varies,     Time 3   Period Weeks   Status On-going           PT Long Term Goals - 05/26/16 1222      PT LONG TERM GOAL #1   Title pt will be </= min A with all HEP given as of last visit (07/07/2016)   Time 6   Period Weeks   Status New     PT LONG TERM GOAL #2   Title she will increase her cervical mobility by >/= 10 degrees in all planes to promote safety with ADLS (07/07/2016)   Time 6   Status New     PT LONG TERM GOAL #3   Title she will demonstrate decrease muscle tightnes in the R shoulder and neck to reduce pain to <=1/10 pain with ADLS (07/07/2016)   Time 6   Period Weeks   Status New     PT LONG TERM GOAL #4   Title she will improve her FOTO score to </= 40 % limited to demonstrate improvement in functionat at  discharge (07/07/2016)   Time 6   Period Weeks   Status New     PT LONG TERM GOAL #5   Title she will be able to lift and carry >/= 8# from floor<>waist and from waist<>overhead with </= 2/10 pain to help with functional lifting activites required for ADLS (07/07/2016)   Time 6   Period Weeks   Status New               Plan - 06/08/16 1026    Clinical Impression Statement pt reported no neck pain today at start of session. She has been having some calf cramping and says she plans to talk to her MD about compression stockings. She reported no increase in pain during exercise but that her muscles get tired quickly. She requires cueing to correct her posture. Continued MHP post session.    PT Next Visit Plan update HEP,  Assess shoulder mobility, manual for upper trap/ thoracic paraspinals, rib mobs? modalities PRN (extra time/ effort may be needed to keep pt on task), decompression, scap stabiliaztion exercises   PT Home Exercise Plan seated thoracic mobility, chin tucks, upper trap stretch,  elbow bands,  cervical AROM   Consulted and Agree with Plan of Care Patient      Patient will benefit from skilled therapeutic intervention in order to improve the following deficits and impairments:  Pain, Improper body mechanics, Postural dysfunction, Decreased endurance, Decreased activity tolerance, Increased fascial restricitons, Decreased range of motion, Decreased mobility  Visit Diagnosis: Cervicalgia  Other muscle spasm  Right shoulder pain, unspecified chronicity  Pain in thoracic spine  Abnormal posture     Problem List Patient Active Problem List   Diagnosis Date Noted  . Acute right-sided thoracic back pain 05/15/2016  . Routine general medical examination at a health care facility 03/14/2015  . Patient noncompliant with statin medication 10/30/2012  . Osteopenia, senile 02/13/2012  . Cervical radiculitis 01/23/2012  . Left thyroid nodule 07/12/2011  . Type II diabetes  mellitus with manifestations (HCC) 04/07/2011  .  Hyperlipidemia with target LDL less than 100 12/26/2010  . Sleep apnea 08/23/2010  . Essential hypertension, benign 09/23/2008  . Allergic rhinitis 10/29/2007  . GERD 10/29/2007  . Irritable bowel syndrome 09/27/2007  . HEPATIC CYST 09/27/2007    Zada GirtHaley Elgie Landino, SPT 06/08/2016, 10:32 AM  [facility] [address] [place], [place], [address] Phone: [phone]   Fax:  [phone]  Name: Arlice ColtRuby M Akridge MRN: [id number] Date of Birth: 03-Oct-1932

## 2016-06-12 ENCOUNTER — Ambulatory Visit: Payer: Medicare Other | Admitting: Physical Therapy

## 2016-06-12 DIAGNOSIS — M542 Cervicalgia: Secondary | ICD-10-CM | POA: Diagnosis not present

## 2016-06-12 DIAGNOSIS — R293 Abnormal posture: Secondary | ICD-10-CM

## 2016-06-12 DIAGNOSIS — M25511 Pain in right shoulder: Secondary | ICD-10-CM | POA: Diagnosis not present

## 2016-06-12 DIAGNOSIS — M62838 Other muscle spasm: Secondary | ICD-10-CM

## 2016-06-12 DIAGNOSIS — M546 Pain in thoracic spine: Secondary | ICD-10-CM | POA: Diagnosis not present

## 2016-06-12 NOTE — Patient Instructions (Signed)

## 2016-06-12 NOTE — Therapy (Signed)
[place], [place], [address] Phone: [phone]   Fax:  [phone]  Physical Therapy Treatment  Patient Details  Name: Brianna Mcdonald MRN: [id number] Date of Birth: 30-Jan-1933 Referring Provider: Janith Lima, MD  Encounter Date: 06/12/2016      PT End of Session - 06/12/16 0931    Visit Number 6   Number of Visits 13   Date for PT Re-Evaluation 07/07/16   PT Start Time 0840   PT Stop Time 0945   PT Time Calculation (min) 65 min   Activity Tolerance Patient tolerated treatment well   Behavior During Therapy [facility] for tasks assessed/performed      Past Medical History:  Diagnosis Date  . Allergy   . Bronchitis, not specified as acute or chronic   . Chest pain, unspecified   . Diabetes mellitus   . Edema   . GERD (gastroesophageal reflux disease)   . Hyperlipidemia   . Hypertension   . Irritable bowel syndrome   . Other specified disorders of liver   . Palpitations     Past Surgical History:  Procedure Laterality Date  . APPENDECTOMY    . DILATION AND CURETTAGE OF UTERUS     x 3  . WRIST SURGERY      There were no vitals filed for this visit.      Subjective Assessment - 06/12/16 0838    Subjective I am sore when I am waking up.  I sleep well.  Exercises are going well.   Currently in Pain? No/denies   Pain Score 0-No pain  mild   Pain Location Neck   Pain Orientation Right   Pain Descriptors / Indicators Sore   Pain Radiating Towards right hand   Aggravating Factors  pressing into her hand   Multiple Pain Sites --  posterior legs behing knees  when she gets uo from sitting ,  mild to moderate.             [facility] PT Assessment - 06/12/16 0001      ROM / Strength   AROM / PROM / Strength --  108 AROM shoulder flexion prior to exercise. arthritis pain.     AROM   Overall AROM  --  shoulder imcreased to 118 after exercise   Cervical - Right Side Bend 16   Cervical - Left Side Bend 12  stiff wit weather today                     [place] Adult PT Treatment/Exercise - 06/12/16 0001      Self-Care   Self-Care Lifting;Other Self-Care Comments  ADL  posture ed.    Other Self-Care Comments  demonstrated options for lifting,  Golfer's lift, patient has adolly she uses to get bleach and water into the home.       Neck Exercises: Seated   Cervical Rotation 5 reps   Lateral Flexion 5 reps   Lateral Flexion Limitations AROM   Shoulder Rolls 5 reps   Other Seated Exercise biceps, triceps red 10 X,       Shoulder Exercises: Pulleys   Flexion 3 minutes  ROM improved with these.      Moist Heat Therapy   Number Minutes Moist Heat 15 Minutes   Moist Heat Location Cervical  extra layer     Manual Therapy   Manual Therapy Soft tissue mobilization;Myofascial release   Manual therapy comments right PEC stretch with myofasacial  releast so upper traps would relax,  patient able to doze  intermittantly   Soft tissue mobilization STM at R upper trap following manual trigger point release   Myofascial Release upper trap,  scapular mobilization , small movements     Neck Exercises: Stretches   Upper Trapezius Stretch 1 rep;30 seconds  good technique notes   Levator Stretch 2 reps;10 seconds  cues initially                PT Education - 06/12/16 0855    Education provided Yes   Education Details ADSL review   Person(s) Educated Patient   Methods Explanation;Demonstration;Handout   Comprehension Verbalized understanding          PT Short Term Goals - 06/12/16 0912      PT SHORT TERM GOAL #1   Title She will be </= Min A with inital HEP given (06/16/2016)   Baseline able   Time 3   Period Weeks   Status Achieved     PT SHORT TERM GOAL #2   Title she will be able to verbalize and demo proper posture and lifting/ carrying mechanics to prevent and reduce neck/ thoracic pain (06/16/2016)   Baseline able to verbalize and  demo   Time 3   Period Weeks   Status Achieved     PT SHORT TERM GOAL #3   Title pt will exhibit decreased upper trap and thoracic paraspinal spasm to reduce pain to </= 4/10  and promote mobility (06/16/2016)   Baseline has spasm today   Time 3   Period Weeks   Status On-going           PT Long Term Goals - 05/26/16 1222      PT LONG TERM GOAL #1   Title pt will be </= min A with all HEP given as of last visit (07/07/2016)   Time 6   Period Weeks   Status New     PT LONG TERM GOAL #2   Title she will increase her cervical mobility by >/= 10 degrees in all planes to promote safety with ADLS (07/05/6438)   Time 6   Status New     PT LONG TERM GOAL #3   Title she will demonstrate decrease muscle tightnes in the R shoulder and neck to reduce pain to <=1/10 pain with ADLS (07/03/7423)   Time 6   Period Weeks   Status New     PT LONG TERM GOAL #4   Title she will improve her FOTO score to </= 40 % limited to demonstrate improvement in functionat at discharge (07/07/2016)   Time 6   Period Weeks   Status New     PT LONG TERM GOAL #5   Title she will be able to lift and carry >/= 8# from floor<>waist and from waist<>overhead with </= 2/10 pain to help with functional lifting activites required for ADLS (01/03/6386)   Time 6   Period Weeks   Status New               Plan - 06/12/16 0931    Clinical Impression Statement STG#1,#2 met.  ROM neck and shoulder decreased today.  patient relates this due to  the weather.  Able to reogress to red band for HEP.  Brief neck pain increased with band ER work.  and decreased with satretches and soft tissue work.   Shoulder AROM initially 108 flexion, right.    PT Next Visit Plan update HEP,  Assess shoulder mobility, manual for upper trap/ thoracic paraspinals, rib mobs? modalities PRN (extra time/ effort may be needed to keep pt on task), decompression, scap stabiliaztion exercises   PT Home Exercise Plan seated thoracic mobility, chin  tucks, upper trap stretch,  elbow bands,  cervical AROM   Consulted and Agree with Plan of Care Patient      Patient will benefit from skilled therapeutic intervention in order to improve the following deficits and impairments:  Pain, Improper body mechanics, Postural dysfunction, Decreased endurance, Decreased activity tolerance, Increased fascial restricitons, Decreased range of motion, Decreased mobility  Visit Diagnosis: Cervicalgia  Other muscle spasm  Right shoulder pain, unspecified chronicity  Pain in thoracic spine  Abnormal posture     Problem List Patient Active Problem List   Diagnosis Date Noted  . Acute right-sided thoracic back pain 05/15/2016  . Routine general medical examination at a health care facility 03/14/2015  . Patient noncompliant with statin medication 10/30/2012  . Osteopenia, senile 02/13/2012  . Cervical radiculitis 01/23/2012  . Left thyroid nodule 07/12/2011  . Type II diabetes mellitus with manifestations ([place]) 04/07/2011  . Hyperlipidemia with target LDL less than 100 12/26/2010  . Sleep apnea 08/23/2010  . Essential hypertension, benign 09/23/2008  . Allergic rhinitis 10/29/2007  . GERD 10/29/2007  . Irritable bowel syndrome 09/27/2007  . HEPATIC CYST 09/27/2007    Wendell Fiebig PTA 06/12/2016, 9:39 AM  [facility] [address] [place], [place], [address] Phone: [phone]   Fax:  [phone]  Name: Brianna Mcdonald MRN: [id number] Date of Birth: 1933/02/28

## 2016-06-15 ENCOUNTER — Ambulatory Visit: Payer: Medicare Other | Admitting: Physical Therapy

## 2016-06-15 DIAGNOSIS — M546 Pain in thoracic spine: Secondary | ICD-10-CM

## 2016-06-15 DIAGNOSIS — M25511 Pain in right shoulder: Secondary | ICD-10-CM | POA: Diagnosis not present

## 2016-06-15 DIAGNOSIS — M542 Cervicalgia: Secondary | ICD-10-CM

## 2016-06-15 DIAGNOSIS — R293 Abnormal posture: Secondary | ICD-10-CM

## 2016-06-15 DIAGNOSIS — M62838 Other muscle spasm: Secondary | ICD-10-CM

## 2016-06-15 NOTE — Patient Instructions (Signed)
Over Head Pull: Narrow Grip       On back, knees bent, feet flat, band across thighs, elbows straight but relaxed. Pull hands apart (start). Keeping elbows straight, bring arms up and over head, hands toward floor. Keep pull steady on band. Hold momentarily. Return slowly, keeping pull steady, back to start. Repeat _10__ times. Band color __YELLOW____   Side Pull: Double Arm   On back, knees bent, feet flat. Arms perpendicular to body, shoulder level, elbows straight but relaxed. Pull arms out to sides, elbows straight. Resistance band comes across collarbones, hands toward floor. Hold momentarily. Slowly return to starting position. Repeat _10__ times. Band color __YELLOW___   Sash   On back, knees bent, feet flat, left hand on left hip, right hand above left. Pull right arm DIAGONALLY (hip to shoulder) across chest. Bring right arm along head toward floor. Hold momentarily. Slowly return to starting position. Repeat _10__ times. Do with left arm. Band color _Yellow_____   Shoulder Rotation: Double Arm   On back, knees bent, feet flat, elbows tucked at sides, bent 90, hands palms up. Pull hands apart and down toward floor, keeping elbows near sides. Hold momentarily. Slowly return to starting position. Repeat _10__ times. Band color _Yellow_____

## 2016-06-15 NOTE — Therapy (Signed)
[facility] Outpatient Rehabilitation [facility] [address] [place], [place], [address] Phone: [phone]   Fax:  [phone]  Physical Therapy Treatment  Patient Details  Name: Brianna Mcdonald MRN: [id number] Date of Birth: 03/04/1933 Referring Provider: Etta Grandchild, MD  Encounter Date: 06/15/2016      PT End of Session - 06/15/16 0930    Visit Number 7   Number of Visits 13   Date for PT Re-Evaluation 07/07/16   PT Start Time 0840   PT Stop Time 0940   PT Time Calculation (min) 60 min   Activity Tolerance Patient tolerated treatment well   Behavior During Therapy [facility] for tasks assessed/performed      Past Medical History:  Diagnosis Date  . Allergy   . Bronchitis, not specified as acute or chronic   . Chest pain, unspecified   . Diabetes mellitus   . Edema   . GERD (gastroesophageal reflux disease)   . Hyperlipidemia   . Hypertension   . Irritable bowel syndrome   . Other specified disorders of liver   . Palpitations     Past Surgical History:  Procedure Laterality Date  . APPENDECTOMY    . DILATION AND CURETTAGE OF UTERUS     x 3  . WRIST SURGERY      There were no vitals filed for this visit.      Subjective Assessment - 06/15/16 0839    Subjective back of knees are sore from walking.  PT is helping.  Today   Currently in Pain? Yes   Pain Score --  Mild            [facility] PT Assessment - 06/15/16 0001      AROM   Overall AROM  --  130  pain end range pinch   Cervical - Right Rotation 55   Cervical - Left Rotation 55                     [facility] Adult PT Treatment/Exercise - 06/15/16 0001      Shoulder Exercises: Supine   Other Supine Exercises decompression position  made her cough with reflux so stopped   Other Supine Exercises supine scapular stabilization, yellow band.  Narrow grip flexion   5 X,  ER, 10 X,  horizontal pulls   Mat table elevated at head for comfort     Shoulder Exercises: Pulleys   Flexion 3 minutes  ROM improved with these.      Shoulder Exercises: Stretch   Corner Stretch 3 reps;30 seconds   Corner Stretch Limitations doorway     Moist Heat Therapy   Number Minutes Moist Heat 15 Minutes   Moist Heat Location Cervical  extra layer, sitting                PT Education - 06/15/16 [phone]    Education provided Yes   Education Details HEP   Person(s) Educated Patient   Methods Explanation;Demonstration   Comprehension Verbalized understanding;Returned demonstration          PT Short Term Goals - 06/12/16 0912      PT SHORT TERM GOAL #1   Title She will be </= Min A with inital HEP given (06/16/2016)   Baseline able   Time 3   Period Weeks   Status Achieved     PT SHORT TERM GOAL #2   Title she will be able to verbalize and demo proper posture and lifting/ carrying mechanics to  prevent and reduce neck/ thoracic pain (06/16/2016)   Baseline able to verbalize and demo   Time 3   Period Weeks   Status Achieved     PT SHORT TERM GOAL #3   Title pt will exhibit decreased upper trap and thoracic paraspinal spasm to reduce pain to </= 4/10  and promote mobility (06/16/2016)   Baseline has spasm today   Time 3   Period Weeks   Status On-going           PT Long Term Goals - 05/26/16 1222      PT LONG TERM GOAL #1   Title pt will be </= min A with all HEP given as of last visit (07/07/2016)   Time 6   Period Weeks   Status New     PT LONG TERM GOAL #2   Title she will increase her cervical mobility by >/= 10 degrees in all planes to promote safety with ADLS (07/07/2016)   Time 6   Status New     PT LONG TERM GOAL #3   Title she will demonstrate decrease muscle tightnes in the R shoulder and neck to reduce pain to <=1/10 pain with ADLS (07/07/2016)   Time 6   Period Weeks   Status New     PT LONG TERM GOAL #4   Title she will improve her FOTO score to </= 40 % limited to demonstrate improvement in functionat at discharge (07/07/2016)   Time  6   Period Weeks   Status New     PT LONG TERM GOAL #5   Title she will be able to lift and carry >/= 8# from floor<>waist and from waist<>overhead with </= 2/10 pain to help with functional lifting activites required for ADLS (07/07/2016)   Time 6   Period Weeks   Status New               Plan - 06/15/16 1041    Clinical Impression Statement Less need of pain medication noted by patient.  Increased Rotation cervical spine today:  55 degrees bilateral.   progress toward home exercise goals.  Progress toward STG#3  with decreased muscle spasms.  However this is not yet consistant.    PT Next Visit Plan review supine scapular stabilization exercises. Try Nustep again   PT Home Exercise Plan seated thoracic mobility, chin tucks, upper trap stretch,  elbow bands,  cervical AROM,  supine scapular stabilization   Consulted and Agree with Plan of Care Patient      Patient will benefit from skilled therapeutic intervention in order to improve the following deficits and impairments:  Pain, Improper body mechanics, Postural dysfunction, Decreased endurance, Decreased activity tolerance, Increased fascial restricitons, Decreased range of motion, Decreased mobility  Visit Diagnosis: Cervicalgia  Other muscle spasm  Right shoulder pain, unspecified chronicity  Pain in thoracic spine  Abnormal posture     Problem List Patient Active Problem List   Diagnosis Date Noted  . Acute right-sided thoracic back pain 05/15/2016  . Routine general medical examination at a health care facility 03/14/2015  . Patient noncompliant with statin medication 10/30/2012  . Osteopenia, senile 02/13/2012  . Cervical radiculitis 01/23/2012  . Left thyroid nodule 07/12/2011  . Type II diabetes mellitus with manifestations (HCC) 04/07/2011  . Hyperlipidemia with target LDL less than 100 12/26/2010  . Sleep apnea 08/23/2010  . Essential hypertension, benign 09/23/2008  . Allergic rhinitis 10/29/2007  .  GERD 10/29/2007  . Irritable bowel syndrome 09/27/2007  .  HEPATIC CYST 09/27/2007    Jaynie Hitch PTA 06/15/2016, 10:46 AM  [facility] [address] [place], [place], [address] Phone: [phone]   Fax:  [phone]  Name: Arlice ColtRuby M Kroft MRN: [id number] Date of Birth: [birth year]

## 2016-06-19 ENCOUNTER — Ambulatory Visit: Payer: Medicare Other | Admitting: Physical Therapy

## 2016-06-19 DIAGNOSIS — M25511 Pain in right shoulder: Secondary | ICD-10-CM

## 2016-06-19 DIAGNOSIS — M546 Pain in thoracic spine: Secondary | ICD-10-CM | POA: Diagnosis not present

## 2016-06-19 DIAGNOSIS — M542 Cervicalgia: Secondary | ICD-10-CM

## 2016-06-19 DIAGNOSIS — R293 Abnormal posture: Secondary | ICD-10-CM | POA: Diagnosis not present

## 2016-06-19 DIAGNOSIS — M62838 Other muscle spasm: Secondary | ICD-10-CM | POA: Diagnosis not present

## 2016-06-19 NOTE — Therapy (Signed)
[facility] Outpatient Rehabilitation [facility] [address] [place], [place], [address] Phone: [phone]   Fax:  [phone]  Physical Therapy Treatment  Patient Details  Name: Brianna Mcdonald MRN: [id number] Date of Birth: 1932/12/02 Referring Provider: Etta Grandchild, MD  Encounter Date: 06/19/2016      PT End of Session - 06/19/16 1017    Visit Number 8   Number of Visits 13   Date for PT Re-Evaluation 07/07/16   PT Start Time 0848   PT Stop Time 0948   PT Time Calculation (min) 60 min   Activity Tolerance Patient tolerated treatment well   Behavior During Therapy [facility] for tasks assessed/performed      Past Medical History:  Diagnosis Date  . Allergy   . Bronchitis, not specified as acute or chronic   . Chest pain, unspecified   . Diabetes mellitus   . Edema   . GERD (gastroesophageal reflux disease)   . Hyperlipidemia   . Hypertension   . Irritable bowel syndrome   . Other specified disorders of liver   . Palpitations     Past Surgical History:  Procedure Laterality Date  . APPENDECTOMY    . DILATION AND CURETTAGE OF UTERUS     x 3  . WRIST SURGERY      There were no vitals filed for this visit.      Subjective Assessment - 06/19/16 1004    Subjective I am doing pretty good.  If my legs didn't hurt , I would say I am OK.     Currently in Pain? Yes   Pain Score --  mild    Pain Location Neck   Pain Orientation Right   Pain Descriptors / Indicators Sore   Pain Type Chronic pain   Pain Frequency Intermittent   Aggravating Factors  over head reaching over and over   Pain Relieving Factors good weather,  medication, heat,  exercises   Effect of Pain on Daily Activities avoids reaching overhead,  avoids heavy lifting.   Multiple Pain Sites --  knees hurt,with rain,  anterior and posterior            [facility] PT Assessment - 06/19/16 0001      Observation/Other Assessments   Focus on Therapeutic Outcomes (FOTO)  43% limited   today,  at intake 41 % limited.  Patient presents differently from outcome.  She appears less limited from intake.                      [facility] Adult PT Treatment/Exercise - 06/19/16 0001      Neck Exercises: Machines for Strengthening   Other Machines for Strengthening Nu step L5 arms/ legs 7 minutes     Neck Exercises: Standing   Other Standing Exercises reaching shelf 3 levels 5 X right arm,  monitored for technique and pain.  patient uses a Sports administrator with most overhead reaching at home.      Neck Exercises: Supine   Neck Retraction 10 reps;5 secs  on incline   Neck Retraction Limitations over rolled sheet along upper back   Other Supine Exercise shoulder press 10 X 10 seconds  with rolled sheet along upper back.   Other Supine Exercise shoulder circles 10 X each way with rolled sheet along upper spine/ back      Shoulder Exercises: Supine   Protraction 10 reps  2 sets one both and 1 set alternatins single   Other Supine Exercises triceps  10 x 2 sets with 1 pound weight each.  PTA supporting elbow     Shoulder Exercises: Pulleys   Flexion 3 minutes     Moist Heat Therapy   Number Minutes Moist Heat 15 Minutes   Moist Heat Location Cervical  extra layers,  sitting with 4 inch foot rest                PT Education - 06/19/16 1017    Education provided Yes   Education Details How to overhead reach   Starwood HotelsPerson(s) Educated Patient   Methods Explanation;Demonstration   Comprehension Verbalized understanding;Returned demonstration          PT Short Term Goals - 06/19/16 1021      PT SHORT TERM GOAL #1   Title She will be </= Min A with inital HEP given (06/16/2016)   Baseline able   Time 3   Period Weeks   Status Achieved     PT SHORT TERM GOAL #2   Title she will be able to verbalize and demo proper posture and lifting/ carrying mechanics to prevent and reduce neck/ thoracic pain (06/16/2016)   Baseline able to verbalize and demo   Time 3   Period  Weeks   Status Achieved     PT SHORT TERM GOAL #3   Time 3   Status Unable to assess           PT Long Term Goals - 05/26/16 1222      PT LONG TERM GOAL #1   Title pt will be </= min A with all HEP given as of last visit (07/07/2016)   Time 6   Period Weeks   Status New     PT LONG TERM GOAL #2   Title she will increase her cervical mobility by >/= 10 degrees in all planes to promote safety with ADLS (07/07/2016)   Time 6   Status New     PT LONG TERM GOAL #3   Title she will demonstrate decrease muscle tightnes in the R shoulder and neck to reduce pain to <=1/10 pain with ADLS (07/07/2016)   Time 6   Period Weeks   Status New     PT LONG TERM GOAL #4   Title she will improve her FOTO score to </= 40 % limited to demonstrate improvement in functionat at discharge (07/07/2016)   Time 6   Period Weeks   Status New     PT LONG TERM GOAL #5   Title she will be able to lift and carry >/= 8# from floor<>waist and from waist<>overhead with </= 2/10 pain to help with functional lifting activites required for ADLS (07/07/2016)   Time 6   Period Weeks   Status New               Plan - 06/19/16 1018    Clinical Impression Statement Patient functionally improving with reaching .  Her FOTO score shows more limitation 43% vs 41% on intake.  She is able to use good technique without pain with reaching into overhead cabinet shelf.    PT Next Visit Plan review supine scapular stabilization exercises. Try Nustep again.  Check specific goals.    PT Home Exercise Plan seated thoracic mobility, chin tucks, upper trap stretch,  elbow bands,  cervical AROM,  supine scapular stabilization   Consulted and Agree with Plan of Care Patient      Patient will benefit from skilled therapeutic intervention in order to improve  the following deficits and impairments:  Pain, Improper body mechanics, Postural dysfunction, Decreased endurance, Decreased activity tolerance, Increased fascial  restricitons, Decreased range of motion, Decreased mobility  Visit Diagnosis: Cervicalgia  Other muscle spasm  Right shoulder pain, unspecified chronicity  Pain in thoracic spine  Abnormal posture     Problem List Patient Active Problem List   Diagnosis Date Noted  . Acute right-sided thoracic back pain 05/15/2016  . Routine general medical examination at a health care facility 03/14/2015  . Patient noncompliant with statin medication 10/30/2012  . Osteopenia, senile 02/13/2012  . Cervical radiculitis 01/23/2012  . Left thyroid nodule 07/12/2011  . Type II diabetes mellitus with manifestations (HCC) 04/07/2011  . Hyperlipidemia with target LDL less than 100 12/26/2010  . Sleep apnea 08/23/2010  . Essential hypertension, benign 09/23/2008  . Allergic rhinitis 10/29/2007  . GERD 10/29/2007  . Irritable bowel syndrome 09/27/2007  . HEPATIC CYST 09/27/2007    Jaquin Coy PTA 06/19/2016, 10:22 AM  [facility] [address] [place], [place], [address] Phone: [phone]   Fax:  [phone]  Name: Brianna Mcdonald MRN: [id number] Date of Birth: 02/08/1933

## 2016-06-21 ENCOUNTER — Ambulatory Visit (INDEPENDENT_AMBULATORY_CARE_PROVIDER_SITE_OTHER): Payer: Medicare Other | Admitting: Podiatry

## 2016-06-21 ENCOUNTER — Encounter: Payer: Self-pay | Admitting: Podiatry

## 2016-06-21 VITALS — BP 107/57 | HR 59 | Resp 18

## 2016-06-21 DIAGNOSIS — B351 Tinea unguium: Secondary | ICD-10-CM | POA: Diagnosis not present

## 2016-06-21 DIAGNOSIS — M79676 Pain in unspecified toe(s): Secondary | ICD-10-CM

## 2016-06-21 NOTE — Progress Notes (Signed)
Patient ID: Brianna ColtRuby M Mcdonald, female   DOB: July 10, 1932, 83 y.o.   MRN: [id number]   Subjective: This patient presents today for scheduled visit complaining that her toenails are uncomfortable when walking wearing shoes and requests toenail debridement Patient is diabetic and denies history of foot ulceration, claudication or amputation Denies history of smoking  Objective: Orientated 3 DP pulses 2/4 bilaterally PT pulses 1/4 bilaterally Capillary reflex within normal limits bilaterally Sedation 10 g monofilament wire intact 5/5 bilaterally Vibratory sensation reactive bilaterally Ankle reflex reactive bilaterally No open skin lesions bilaterally Skin texture within normal limits bilaterally The toenails elongated, discolored, brittle, with subungual debris and tenderness to direct palpation HAV bilaterally Manual motor testing dorsi flexion, plantar flexion 5/5 bilaterally  Assessment: Diabetic with satisfactory neurovascular status Symptomatic onychomycoses 6-10  Plan: Debridement toenails 6-10 mechanically and electrically without any bleeding  Reappoint 3 months

## 2016-06-21 NOTE — Patient Instructions (Signed)

## 2016-06-21 NOTE — Progress Notes (Signed)
   Subjective:    Patient ID: Brianna Mcdonald, female    DOB: [birth year], 83 y.o.   MRN: [id number]  HPI    I have some toenails that need to be trimmed     Review of Systems  All other systems reviewed and are negative.      Objective:   Physical Exam        Assessment & Plan:

## 2016-06-21 NOTE — Progress Notes (Signed)
Patient ID: Brianna ColtRuby M Boney, female   DOB: Jul 08, 1932, 83 y.o.   MRN: [id number]

## 2016-06-22 ENCOUNTER — Ambulatory Visit: Payer: Medicare Other | Admitting: Physical Therapy

## 2016-06-22 DIAGNOSIS — M546 Pain in thoracic spine: Secondary | ICD-10-CM | POA: Diagnosis not present

## 2016-06-22 DIAGNOSIS — M542 Cervicalgia: Secondary | ICD-10-CM

## 2016-06-22 DIAGNOSIS — M25511 Pain in right shoulder: Secondary | ICD-10-CM

## 2016-06-22 DIAGNOSIS — R293 Abnormal posture: Secondary | ICD-10-CM

## 2016-06-22 DIAGNOSIS — M62838 Other muscle spasm: Secondary | ICD-10-CM | POA: Diagnosis not present

## 2016-06-22 NOTE — Therapy (Addendum)
[place], [place], [address] Phone: [phone]   Fax:  [phone]  Physical Therapy Treatment / Discharge Summary   Patient Details  Name: Brianna Mcdonald MRN: [id number] Date of Birth: 23-Jun-1932 Referring Provider: Janith Lima, MD  Encounter Date: 06/22/2016      PT End of Session - 06/22/16 1330    Visit Number 9   Number of Visits 13   Date for PT Re-Evaluation 07/07/16   PT Start Time 0848   PT Stop Time 0948   PT Time Calculation (min) 60 min   Activity Tolerance Patient tolerated treatment well   Behavior During Therapy [facility] for tasks assessed/performed      Past Medical History:  Diagnosis Date  . Allergy   . Bronchitis, not specified as acute or chronic   . Chest pain, unspecified   . Diabetes mellitus   . Edema   . GERD (gastroesophageal reflux disease)   . Hyperlipidemia   . Hypertension   . Irritable bowel syndrome   . Other specified disorders of liver   . Palpitations     Past Surgical History:  Procedure Laterality Date  . APPENDECTOMY    . DILATION AND CURETTAGE OF UTERUS     x 3  . WRIST SURGERY      There were no vitals filed for this visit.      Subjective Assessment - 06/22/16 1328    Subjective I was able to tip a 3 gallon container of water to pour.  It was on the counter.  I was surprised.   Currently in Pain? Yes   Pain Score --  mild   Pain Location Neck   Pain Orientation Right   Pain Descriptors / Indicators Sore   Pain Type Chronic pain   Aggravating Factors  reaching   Pain Relieving Factors rest heat   Effect of Pain on Daily Activities avoids lifting            [facility] PT Assessment - 06/22/16 0001      AROM   Cervical - Right Side Bend 42   Cervical - Left Side Bend 45  Moves slow, guarded                     [facility] Adult PT Treatment/Exercise - 06/22/16 0001      Neck Exercises: Standing   Other Standing  Exercises rolling 5 Lb barbell, end over end on counter 3 feet each way.  Lifting 8 LBS to 8" box 5 X, both hands,  carry 8 LBS 1 hand at hip 30 feet  then 2 hand lift to 8" box on counter.  No increased pain.      Shoulder Exercises: Seated   Elevation --   External Rotation 10 reps   Theraband Level (Shoulder External Rotation) Level 2 (Red)   Other Seated Exercises 5 LB barbell, 2 hands lift from knees to chest to eyelyvel Min assist initially    Other Seated Exercises triceps,  L2 10 X each monitored shoulder elevation     Shoulder Exercises: Pulleys   Flexion 3 minutes     Moist Heat Therapy   Number Minutes Moist Heat 15 Minutes   Moist Heat Location Cervical                  PT Short Term Goals - 06/19/16 1021      PT SHORT TERM GOAL #1   Title She  will be </= Min A with inital HEP given (06/16/2016)   Baseline able   Time 3   Period Weeks   Status Achieved     PT SHORT TERM GOAL #2   Title she will be able to verbalize and demo proper posture and lifting/ carrying mechanics to prevent and reduce neck/ thoracic pain (06/16/2016)   Baseline able to verbalize and demo   Time 3   Period Weeks   Status Achieved     PT SHORT TERM GOAL #3   Time 3   Status Unable to assess           PT Long Term Goals - 05/26/16 1222      PT LONG TERM GOAL #1   Title pt will be </= min A with all HEP given as of last visit (07/07/2016)   Time 6   Period Weeks   Status New     PT LONG TERM GOAL #2   Title she will increase her cervical mobility by >/= 10 degrees in all planes to promote safety with ADLS (06/07/348)   Time 6   Status New     PT LONG TERM GOAL #3   Title she will demonstrate decrease muscle tightnes in the R shoulder and neck to reduce pain to <=1/10 pain with ADLS (0/12/3816)   Time 6   Period Weeks   Status New     PT LONG TERM GOAL #4   Title she will improve her FOTO score to </= 40 % limited to demonstrate improvement in functionat at discharge  (07/07/2016)   Time 6   Period Weeks   Status New     PT LONG TERM GOAL #5   Title she will be able to lift and carry >/= 8# from floor<>waist and from waist<>overhead with </= 2/10 pain to help with functional lifting activites required for ADLS (06/09/9369)   Time 6   Period Weeks   Status New               Plan - 06/22/16 1331    Clinical Impression Statement Able to progress to lifting ang carriong today.  Able to carry 8 LBS 30 feet.  Progress toward her lifting goals.  Mild pain increase at end of session prior to moist heat.   PT Next Visit Plan continue lifting strengthening,  Nu step   PT Home Exercise Plan seated thoracic mobility, chin tucks, upper trap stretch,  elbow bands,  cervical AROM,  supine scapular stabilization   Consulted and Agree with Plan of Care Patient      Patient will benefit from skilled therapeutic intervention in order to improve the following deficits and impairments:  Pain, Improper body mechanics, Postural dysfunction, Decreased endurance, Decreased activity tolerance, Increased fascial restricitons, Decreased range of motion, Decreased mobility  Visit Diagnosis: Cervicalgia  Other muscle spasm  Right shoulder pain, unspecified chronicity  Pain in thoracic spine  Abnormal posture     Problem List Patient Active Problem List   Diagnosis Date Noted  . Acute right-sided thoracic back pain 05/15/2016  . Routine general medical examination at a health care facility 03/14/2015  . Patient noncompliant with statin medication 10/30/2012  . Osteopenia, senile 02/13/2012  . Cervical radiculitis 01/23/2012  . Left thyroid nodule 07/12/2011  . Type II diabetes mellitus with manifestations ([place]) 04/07/2011  . Hyperlipidemia with target LDL less than 100 12/26/2010  . Sleep apnea 08/23/2010  . Essential hypertension, benign 09/23/2008  . Allergic rhinitis 10/29/2007  .  GERD 10/29/2007  . Irritable bowel syndrome 09/27/2007  . HEPATIC CYST  09/27/2007    [name] PTA 06/22/2016, 1:34 PM  [facility] [address] [place], [place], [address] Phone: [phone]   Fax:  [phone]  Name: Brianna Mcdonald MRN: [id number] Date of Birth: February 07, 1933     PHYSICAL THERAPY DISCHARGE SUMMARY  Visits from Start of Care: 9  Current functional level related to goals / functional outcomes: See goals   Remaining deficits: unknown   Education / Equipment: HEP  Plan: Patient agrees to discharge.  Patient goals were partially met. Patient is being discharged due to not returning since the last visit.  ?????      Kristoffer Leamon PT, DPT, LAT, ATC  08/01/16  1:37 PM

## 2016-06-26 ENCOUNTER — Ambulatory Visit: Payer: Medicare Other | Admitting: Physical Therapy

## 2016-06-27 ENCOUNTER — Ambulatory Visit (INDEPENDENT_AMBULATORY_CARE_PROVIDER_SITE_OTHER): Payer: Medicare Other

## 2016-06-27 ENCOUNTER — Ambulatory Visit (INDEPENDENT_AMBULATORY_CARE_PROVIDER_SITE_OTHER): Payer: Medicare Other | Admitting: Podiatry

## 2016-06-27 ENCOUNTER — Encounter: Payer: Self-pay | Admitting: Podiatry

## 2016-06-27 DIAGNOSIS — M7750 Other enthesopathy of unspecified foot: Secondary | ICD-10-CM

## 2016-06-27 DIAGNOSIS — M25571 Pain in right ankle and joints of right foot: Secondary | ICD-10-CM

## 2016-06-27 DIAGNOSIS — S93421A Sprain of deltoid ligament of right ankle, initial encounter: Secondary | ICD-10-CM

## 2016-06-27 MED ORDER — MELOXICAM 7.5 MG PO TABS: 7.5000 mg | ORAL_TABLET | Freq: Every day | ORAL | 0 refills | Status: DC

## 2016-06-27 NOTE — Progress Notes (Signed)
patient presents to the office with chief complaint of pain in her right ankle. She says that when she awoke from sleep yesterday morning. She had severe pain in her right ankle. She says it was very painful which caused her to have difficulty walking through the course of the day. She is presents the office today stating that the office that the ankle continues to be painful as she walks, but is better than it was yesterday. She denies any history of trauma or reinjury to the foot. She presents the office today for an evaluation and treatment of her right ankle.  Objective neurovascular status is intact neurovascularly and neurologically her an evaluation by Dr. Leeanne Deeduchman on 06/21/2016. Examination of her right ankle reveals pain, redness and swelling along the course of the posterior tibial tendon of the right ankle. She has increased pain and swelling along the course of the posterior tibial tendon, right ankle. She does have intact. Muscle power, but it is weak.    Ankle tendinitis right ankle  ROV  Xrays taken reveal no pathology.  Dispensed elastic sock.  I was prepared to give her a cortisone injection at the painful area of the right ankle and after washing the skin with alcohol. She quickly pulled back her foot and said its cutter hurt too much and refused to allow me to give her the injection. We then decided to give her anti-inflammatories, which we found her to be allergic to Mobic, ibuprofen and Naprosyn at this juncture. I told her she needs to stop her physical therapy, which could be aggravating this condition and allow her ankle to heal. He was also told she to take acetaminophen during the course of the day to control the pain. She is to return to the office if the problem persists and we need to treat her at that time with immobilization. Return to the office in 2 weeks for evaluation and treatment   Helane GuntherGregory Alexsander Cavins DPM

## 2016-06-29 ENCOUNTER — Ambulatory Visit: Payer: Medicare Other | Admitting: Physical Therapy

## 2016-07-11 ENCOUNTER — Encounter: Payer: Self-pay | Admitting: Podiatry

## 2016-07-11 ENCOUNTER — Ambulatory Visit (INDEPENDENT_AMBULATORY_CARE_PROVIDER_SITE_OTHER): Payer: Medicare Other | Admitting: Podiatry

## 2016-07-11 DIAGNOSIS — M25571 Pain in right ankle and joints of right foot: Secondary | ICD-10-CM | POA: Diagnosis not present

## 2016-07-11 DIAGNOSIS — M7750 Other enthesopathy of unspecified foot: Secondary | ICD-10-CM | POA: Diagnosis not present

## 2016-07-11 DIAGNOSIS — S93421A Sprain of deltoid ligament of right ankle, initial encounter: Secondary | ICD-10-CM

## 2016-07-11 NOTE — Progress Notes (Signed)
patient presents to the office following diagnosis of an ankle tendinitis, right ankle. She presents the office today stating that she is markedly improved, not having any pain or discomfort at this time. She says she wore the elastic sock and took homeopathic medicine for pain. She says the ankle gradually got better and she is very pleased with her progress. She presents the office today for continued evaluation and treatment of her right ankle  Objective neurovascular status is intact neurovascularly and neurologically her an evaluation by Dr. Leeanne Deeduchman on 06/21/2016. Examination of her right ankle reveals no evidence of any redness, swelling or pain along the deltoid ligament or along the course of the posterior tibial tendon, right foot. No evidence of increased temperature of the right ankle   Ankle tendinitis right ankle, resolved  ROV  this patient was told to continue wearing her elastic sock when she walks.  She can return to regular activities including physical therapy.  He is to return to the office if the problem reoccurs   Helane GuntherGregory Jarelis Ehlert DPM

## 2016-08-14 ENCOUNTER — Other Ambulatory Visit: Payer: Self-pay | Admitting: Internal Medicine

## 2016-08-15 ENCOUNTER — Other Ambulatory Visit: Payer: Self-pay | Admitting: *Deleted

## 2016-08-15 MED ORDER — DESLORATADINE 5 MG PO TABS: 5.0000 mg | ORAL_TABLET | Freq: Every day | ORAL | 1 refills | Status: DC

## 2016-08-15 MED ORDER — ALBUTEROL SULFATE HFA 108 (90 BASE) MCG/ACT IN AERS: 1.0000 | INHALATION_SPRAY | Freq: Four times a day (QID) | RESPIRATORY_TRACT | 0 refills | Status: AC | PRN

## 2016-08-15 NOTE — Telephone Encounter (Signed)
Rec'd call pt requesting refills on her pro air,  clarinex, and lansoprazole inform pt per chart rx for lansoprazole was sent yesterday to [facility]...Brianna Mcdonald

## 2016-08-21 ENCOUNTER — Other Ambulatory Visit (INDEPENDENT_AMBULATORY_CARE_PROVIDER_SITE_OTHER): Payer: Medicare Other

## 2016-08-21 ENCOUNTER — Encounter: Payer: Self-pay | Admitting: Internal Medicine

## 2016-08-21 ENCOUNTER — Ambulatory Visit (INDEPENDENT_AMBULATORY_CARE_PROVIDER_SITE_OTHER): Payer: Medicare Other | Admitting: Internal Medicine

## 2016-08-21 VITALS — BP 118/70 | HR 49 | Temp 98.2°F | Resp 16 | Ht 61.0 in | Wt 134.4 lb

## 2016-08-21 DIAGNOSIS — I10 Essential (primary) hypertension: Secondary | ICD-10-CM

## 2016-08-21 DIAGNOSIS — E118 Type 2 diabetes mellitus with unspecified complications: Secondary | ICD-10-CM

## 2016-08-21 LAB — HEMOGLOBIN A1C: HEMOGLOBIN A1C: 6.5 % (ref 4.6–6.5)

## 2016-08-21 LAB — BASIC METABOLIC PANEL
BUN: 16 mg/dL (ref 6–23)
CALCIUM: 9.6 mg/dL (ref 8.4–10.5)
CO2: 31 mEq/L (ref 19–32)
Chloride: 103 mEq/L (ref 96–112)
Creatinine, Ser: 0.85 mg/dL (ref 0.40–1.20)
GFR: 82.36 mL/min (ref >60.00)
Glucose, Bld: 120 mg/dL — ABNORMAL HIGH (ref 70–99)
POTASSIUM: 4.2 meq/L (ref 3.5–5.1)
Sodium: 139 mEq/L (ref 135–145)

## 2016-08-21 NOTE — Progress Notes (Signed)
Pre visit review using our clinic review tool, if applicable. No additional management support is needed unless otherwise documented below in the visit note. 

## 2016-08-21 NOTE — Patient Instructions (Signed)

## 2016-08-21 NOTE — Addendum Note (Signed)
Addended by: Verlan Friends on: 08/21/2016 01:36 PM   Modules accepted: Orders

## 2016-08-21 NOTE — Progress Notes (Signed)
Subjective:  Patient ID: Brianna Mcdonald, female    DOB: Dec 29, 1932  Age: 83 y.o. MRN: [id number]  CC: Hypertension and Diabetes   HPI APOORVA BUGAY presents for f/up -she feels well and offers no new complaints.  Outpatient Medications Prior to Visit  Medication Sig Dispense Refill  . albuterol (PROAIR HFA) 108 (90 Base) MCG/ACT inhaler Inhale 1 puff into the lungs every 6 (six) hours as needed for wheezing or shortness of breath (or with excersize). 18 g 0  . CALCIUM-MAGNESUIUM-ZINC 333-133-8.3 MG TABS Take 1 tablet by mouth 2 (two) times daily.     . clotrimazole-betamethasone (LOTRISONE) cream     . Cyanocobalamin (VITAMIN B-12) 1000 MCG SUBL Place 1 tablet under the tongue daily.     Marland Kitchen desloratadine (CLARINEX) 5 MG tablet Take 1 tablet (5 mg total) by mouth daily. 90 tablet 1  . fish oil-omega-3 fatty acids 1000 MG capsule Take 1 capsule by mouth daily.     . fluticasone (FLONASE) 50 MCG/ACT nasal spray Place 2 sprays into both nostrils daily. (Patient taking differently: Place 2 sprays into both nostrils 2 (two) times daily as needed for allergies. ) 16 g 11  . lansoprazole (PREVACID) 30 MG capsule TAKE ONE CAPSULE BY MOUTH ONCE DAILY 30 capsule 11  . metoprolol succinate (TOPROL-XL) 25 MG 24 hr tablet TAKE ONE TABLET BY MOUTH ONCE DAILY 30 tablet 11  . selenium 50 MCG TABS tablet Take 50 mcg by mouth daily.     . Tetrahydrozoline HCl (VISION CLEAR OP) Apply 1 tablet to eye daily.    . traMADol (ULTRAM) 50 MG tablet Take 1 tablet (50 mg total) by mouth every 12 (twelve) hours as needed. 60 tablet 1  . Tetrahydrozoline HCl (VISION CLEAR OP) Apply 1 tablet to eye daily.     No facility-administered medications prior to visit.     ROS Review of Systems  Constitutional: Negative for appetite change, chills, diaphoresis, fatigue and unexpected weight change.  HENT: Negative.  Negative for trouble swallowing.   Eyes: Negative for visual disturbance.  Respiratory: Negative  for cough, chest tightness, shortness of breath and wheezing.   Cardiovascular: Negative for chest pain, palpitations and leg swelling.  Gastrointestinal: Negative for abdominal pain, constipation, diarrhea, nausea and vomiting.  Endocrine: Negative.  Negative for polydipsia, polyphagia and polyuria.  Genitourinary: Negative.  Negative for decreased urine volume, difficulty urinating, dysuria and hematuria.  Musculoskeletal: Negative.   Skin: Negative.   Allergic/Immunologic: Negative.   Neurological: Negative.  Negative for dizziness, weakness and numbness.  Hematological: Negative for adenopathy. Does not bruise/bleed easily.  Psychiatric/Behavioral: Negative.     Objective:  BP 118/70 (BP Location: Left Arm, Patient Position: Sitting, Cuff Size: Normal)   Pulse (!) 49   Temp 98.2 F (36.8 C) (Oral)   Resp 16   Ht  (1.549 m)   Wt 134 lb 6 oz (61 kg)   SpO2 100%   BMI 25.39 kg/m   BP Readings from Last 3 Encounters:  08/21/16 118/70  06/21/16 (!) 107/57  05/25/16 (!) 114/58    Wt Readings from Last 3 Encounters:  08/21/16 134 lb 6 oz (61 kg)  05/25/16 140 lb (63.5 kg)  05/15/16 140 lb (63.5 kg)    Physical Exam  Constitutional: She is oriented to person, place, and time. No distress.  HENT:  Mouth/Throat: Oropharynx is clear and moist. No oropharyngeal exudate.  Eyes: Conjunctivae are normal. Right eye exhibits no discharge. Left eye exhibits no discharge.  No scleral icterus.  Neck: Normal range of motion. Neck supple. No JVD present. No tracheal deviation present. No thyromegaly present.  Cardiovascular: Normal rate, normal heart sounds and intact distal pulses.  Exam reveals no gallop and no friction rub.   No murmur heard. Pulmonary/Chest: Effort normal and breath sounds normal. No stridor. No respiratory distress. She has no wheezes. She has no rales. She exhibits no tenderness.  Abdominal: Soft. Bowel sounds are normal. She exhibits no distension and no mass.  There is no tenderness. There is no rebound and no guarding.  Musculoskeletal: Normal range of motion. She exhibits no edema, tenderness or deformity.  Lymphadenopathy:    She has no cervical adenopathy.  Neurological: She is oriented to person, place, and time.  Skin: Skin is warm and dry. No rash noted. She is not diaphoretic. No erythema. No pallor.  Vitals reviewed.   Lab Results  Component Value Date   WBC 5.2 02/21/2016   HGB 12.8 02/21/2016   HCT 37.5 02/21/2016   PLT 188.0 02/21/2016   GLUCOSE 120 (H) 08/21/2016   CHOL 203 (H) 02/21/2016   TRIG 64.0 02/21/2016   HDL 67.80 02/21/2016   LDLDIRECT 144.4 10/30/2012   LDLCALC 122 (H) 02/21/2016   ALT 13 02/21/2016   AST 20 02/21/2016   NA 139 08/21/2016   K 4.2 08/21/2016   CL 103 08/21/2016   CREATININE 0.85 08/21/2016   BUN 16 08/21/2016   CO2 31 08/21/2016   TSH 1.43 02/21/2016   HGBA1C 6.5 08/21/2016   MICROALBUR <0.7 02/21/2016    Ct Abdomen Pelvis Wo Contrast  Result Date: 11/09/2015 CLINICAL DATA:  Left upper quadrant abdominal pain. One episode of vomiting last night. EXAM: CT ABDOMEN AND PELVIS WITHOUT CONTRAST TECHNIQUE: Multidetector CT imaging of the abdomen and pelvis was performed following the standard protocol without IV contrast. COMPARISON:  06/10/2007. FINDINGS: Lower chest:  Mild bilateral dependent atelectasis/ scarring. Hepatobiliary: Little change in previously demonstrated liver cysts. Normal appearing gallbladder. Pancreas: No mass or inflammatory process identified on this un-enhanced exam. Spleen: Within normal limits in size and appearance. Adrenals/Urinary Tract: Normal appearing adrenal glands, kidneys, ureters and urinary bladder. No calculi or hydronephrosis. No visible mass. Stomach/Bowel: Mild sigmoid colon diverticulosis. No gastric or small bowel abnormalities. Surgically absent appendix. Vascular/Lymphatic: No pathologically enlarged lymph nodes. No evidence of abdominal aortic aneurysm.  Reproductive: Small calcified uterine fibroid.  No adnexal mass. Other: None. Musculoskeletal: Lumbar and lower thoracic spine degenerative changes. IMPRESSION: No acute abnormality. Electronically Signed   By: Beckie Salts M.D.   On: 11/09/2015 09:38   Dg Chest 2 View  Result Date: 11/09/2015 CLINICAL DATA:  83 year old female with a history of left upper quadrant pain EXAM: CHEST  2 VIEW COMPARISON:  04/02/2013, 12/01/2007 FINDINGS: Cardiomediastinal silhouette unchanged in size and contour. No confluent airspace disease, pneumothorax, or pleural effusion. Unremarkable appearance of the upper abdomen. IMPRESSION: No radiographic evidence of acute cardiopulmonary disease, background chronic changes. Aortic atherosclerosis. Signed, Yvone Neu. Loreta Ave, DO [facility] Electronically Signed   By: Gilmer Mor D.O.   On: 11/09/2015 09:31    Assessment & Plan:   Raeley was seen today for hypertension and diabetes.  Diagnoses and all orders for this visit:  Essential hypertension, benign- her BP is well controlled, lytes and renal fxn are normal -     Basic metabolic panel; Future  Type 2 diabetes mellitus with complication, without long-term current use of insulin (HCC) - with an A1C  of 6.5% her blood sugars are well controlled -     Basic metabolic panel; Future -     Hemoglobin A1c; Future   I am having Ms. Polcyn maintain her fish oil-omega-3 fatty acids, CALCIUM-MAGNESUIUM-ZINC, Vitamin B-12, selenium, fluticasone, metoprolol succinate, traMADol, Tetrahydrozoline HCl (VISION CLEAR OP), clotrimazole-betamethasone, lansoprazole, albuterol, and desloratadine.  No orders of the defined types were placed in this encounter.    Follow-up: Return in about 6 months (around 02/20/2017).  Sanda Linger, MD

## 2016-08-28 ENCOUNTER — Ambulatory Visit (INDEPENDENT_AMBULATORY_CARE_PROVIDER_SITE_OTHER): Payer: Medicare Other | Admitting: Internal Medicine

## 2016-08-28 ENCOUNTER — Encounter: Payer: Self-pay | Admitting: Internal Medicine

## 2016-08-28 VITALS — BP 140/70 | HR 56 | Temp 98.5°F | Resp 16 | Ht 61.0 in | Wt 136.0 lb

## 2016-08-28 DIAGNOSIS — K529 Noninfective gastroenteritis and colitis, unspecified: Secondary | ICD-10-CM | POA: Diagnosis not present

## 2016-08-28 MED ORDER — PROMETHAZINE HCL 12.5 MG PO TABS: 12.5000 mg | ORAL_TABLET | Freq: Four times a day (QID) | ORAL | 0 refills | Status: DC | PRN

## 2016-08-28 NOTE — Progress Notes (Signed)
Pre visit review using our clinic review tool, if applicable. No additional management support is needed unless otherwise documented below in the visit note. 

## 2016-08-28 NOTE — Patient Instructions (Signed)

## 2016-08-28 NOTE — Progress Notes (Signed)
Subjective:  Patient ID: Brianna Mcdonald, female    DOB: May 03, 1932  Age: 83 y.o. MRN: [id number]  CC: Emesis   HPI Brianna Mcdonald presents for the acute onset today of HA, muscle aches, nausea, and one episode of vomiting clear fluid.  Outpatient Medications Prior to Visit  Medication Sig Dispense Refill  . albuterol (PROAIR HFA) 108 (90 Base) MCG/ACT inhaler Inhale 1 puff into the lungs every 6 (six) hours as needed for wheezing or shortness of breath (or with excersize). 18 g 0  . CALCIUM-MAGNESUIUM-ZINC 333-133-8.3 MG TABS Take 1 tablet by mouth 2 (two) times daily.     . clotrimazole-betamethasone (LOTRISONE) cream     . Cyanocobalamin (VITAMIN B-12) 1000 MCG SUBL Place 1 tablet under the tongue daily.     Marland Kitchen desloratadine (CLARINEX) 5 MG tablet Take 1 tablet (5 mg total) by mouth daily. 90 tablet 1  . fish oil-omega-3 fatty acids 1000 MG capsule Take 1 capsule by mouth daily.     . fluticasone (FLONASE) 50 MCG/ACT nasal spray Place 2 sprays into both nostrils daily. (Patient taking differently: Place 2 sprays into both nostrils 2 (two) times daily as needed for allergies. ) 16 g 11  . lansoprazole (PREVACID) 30 MG capsule TAKE ONE CAPSULE BY MOUTH ONCE DAILY 30 capsule 11  . metoprolol succinate (TOPROL-XL) 25 MG 24 hr tablet TAKE ONE TABLET BY MOUTH ONCE DAILY 30 tablet 11  . selenium 50 MCG TABS tablet Take 50 mcg by mouth daily.     . Tetrahydrozoline HCl (VISION CLEAR OP) Apply 1 tablet to eye daily.    . traMADol (ULTRAM) 50 MG tablet Take 1 tablet (50 mg total) by mouth every 12 (twelve) hours as needed. 60 tablet 1   No facility-administered medications prior to visit.     ROS Review of Systems  Constitutional: Negative for activity change, appetite change, chills, diaphoresis, fatigue and fever.  HENT: Negative.  Negative for sore throat and trouble swallowing.   Eyes: Negative for visual disturbance.  Respiratory: Negative for cough, chest tightness, shortness  of breath and wheezing.   Cardiovascular: Negative for chest pain, palpitations and leg swelling.  Gastrointestinal: Positive for nausea and vomiting. Negative for abdominal pain, blood in stool, constipation and diarrhea.  Endocrine: Negative.   Genitourinary: Negative.   Musculoskeletal: Positive for myalgias. Negative for back pain.  Skin: Negative.  Negative for color change, pallor and rash.  Allergic/Immunologic: Negative.   Neurological: Positive for headaches. Negative for dizziness, weakness and numbness.  Hematological: Negative for adenopathy. Does not bruise/bleed easily.  Psychiatric/Behavioral: Negative.     Objective:  BP 140/70 (BP Location: Left Arm, Patient Position: Sitting, Cuff Size: Normal)   Pulse (!) 56   Temp 98.5 F (36.9 C) (Oral)   Resp 16   Ht  (1.549 m)   Wt 136 lb (61.7 kg)   SpO2 99%   BMI 25.70 kg/m   BP Readings from Last 3 Encounters:  08/28/16 140/70  08/21/16 118/70  06/21/16 (!) 107/57    Wt Readings from Last 3 Encounters:  08/28/16 136 lb (61.7 kg)  08/21/16 134 lb 6 oz (61 kg)  05/25/16 140 lb (63.5 kg)    Physical Exam  Constitutional:  Non-toxic appearance. She does not have a sickly appearance. She does not appear ill. No distress.  HENT:  Mouth/Throat: Oropharynx is clear and moist. No oropharyngeal exudate.  Eyes: Conjunctivae are normal. Right eye exhibits no discharge. Left eye exhibits no discharge.  No scleral icterus.  Neck: Normal range of motion. Neck supple. No JVD present. No tracheal deviation present. No thyromegaly present.  Cardiovascular: Normal rate, regular rhythm, normal heart sounds and intact distal pulses.  Exam reveals no gallop and no friction rub.   No murmur heard. Pulmonary/Chest: Effort normal and breath sounds normal. No stridor. No respiratory distress. She has no wheezes. She has no rales. She exhibits no tenderness.  Abdominal: Soft. Bowel sounds are normal. She exhibits no distension and no  mass. There is no tenderness. There is no rebound and no guarding.  Musculoskeletal: Normal range of motion. She exhibits no edema, tenderness or deformity.  Lymphadenopathy:    She has no cervical adenopathy.  Skin: Skin is warm and dry. No rash noted. She is not diaphoretic. No erythema. No pallor.  Vitals reviewed.   Lab Results  Component Value Date   WBC 5.2 02/21/2016   HGB 12.8 02/21/2016   HCT 37.5 02/21/2016   PLT 188.0 02/21/2016   GLUCOSE 120 (H) 08/21/2016   CHOL 203 (H) 02/21/2016   TRIG 64.0 02/21/2016   HDL 67.80 02/21/2016   LDLDIRECT 144.4 10/30/2012   LDLCALC 122 (H) 02/21/2016   ALT 13 02/21/2016   AST 20 02/21/2016   NA 139 08/21/2016   K 4.2 08/21/2016   CL 103 08/21/2016   CREATININE 0.85 08/21/2016   BUN 16 08/21/2016   CO2 31 08/21/2016   TSH 1.43 02/21/2016   HGBA1C 6.5 08/21/2016   MICROALBUR <0.7 02/21/2016    Ct Abdomen Pelvis Wo Contrast  Result Date: 11/09/2015 CLINICAL DATA:  Left upper quadrant abdominal pain. One episode of vomiting last night. EXAM: CT ABDOMEN AND PELVIS WITHOUT CONTRAST TECHNIQUE: Multidetector CT imaging of the abdomen and pelvis was performed following the standard protocol without IV contrast. COMPARISON:  06/10/2007. FINDINGS: Lower chest:  Mild bilateral dependent atelectasis/ scarring. Hepatobiliary: Little change in previously demonstrated liver cysts. Normal appearing gallbladder. Pancreas: No mass or inflammatory process identified on this un-enhanced exam. Spleen: Within normal limits in size and appearance. Adrenals/Urinary Tract: Normal appearing adrenal glands, kidneys, ureters and urinary bladder. No calculi or hydronephrosis. No visible mass. Stomach/Bowel: Mild sigmoid colon diverticulosis. No gastric or small bowel abnormalities. Surgically absent appendix. Vascular/Lymphatic: No pathologically enlarged lymph nodes. No evidence of abdominal aortic aneurysm. Reproductive: Small calcified uterine fibroid.  No  adnexal mass. Other: None. Musculoskeletal: Lumbar and lower thoracic spine degenerative changes. IMPRESSION: No acute abnormality. Electronically Signed   By: Beckie Salts M.D.   On: 11/09/2015 09:38   Dg Chest 2 View  Result Date: 11/09/2015 CLINICAL DATA:  83 year old female with a history of left upper quadrant pain EXAM: CHEST  2 VIEW COMPARISON:  04/02/2013, 12/01/2007 FINDINGS: Cardiomediastinal silhouette unchanged in size and contour. No confluent airspace disease, pneumothorax, or pleural effusion. Unremarkable appearance of the upper abdomen. IMPRESSION: No radiographic evidence of acute cardiopulmonary disease, background chronic changes. Aortic atherosclerosis. Signed, Yvone Neu. Loreta Ave, DO [facility] Electronically Signed   By: Gilmer Mor D.O.   On: 11/09/2015 09:31    Assessment & Plan:   Brianna Mcdonald was seen today for emesis.  Diagnoses and all orders for this visit:  Acute gastroenteritis- she is able to keep down liquids, will treat the sx's with phenergan, she will let me know if she develops any new or worsening sx's -     promethazine (PHENERGAN) 12.5 MG tablet; Take 1 tablet (12.5 mg total) by mouth every 6 (six) hours as  needed for nausea or vomiting.   I am having Ms. Douthat start on promethazine. I am also having her maintain her fish oil-omega-3 fatty acids, CALCIUM-MAGNESUIUM-ZINC, Vitamin B-12, selenium, fluticasone, metoprolol succinate, traMADol, Tetrahydrozoline HCl (VISION CLEAR OP), clotrimazole-betamethasone, lansoprazole, albuterol, and desloratadine.  Meds ordered this encounter  Medications  . promethazine (PHENERGAN) 12.5 MG tablet    Sig: Take 1 tablet (12.5 mg total) by mouth every 6 (six) hours as needed for nausea or vomiting.    Dispense:  25 tablet    Refill:  0     Follow-up: Return if symptoms worsen or fail to improve.  Sanda Linger, MD

## 2016-09-06 LAB — HM DIABETES EYE EXAM

## 2016-09-07 ENCOUNTER — Telehealth: Payer: Self-pay | Admitting: Internal Medicine

## 2016-09-07 NOTE — Telephone Encounter (Signed)
Pt would like results from labs 4/23

## 2016-09-07 NOTE — Telephone Encounter (Signed)
Results have not been release. Can you advise in PCP absence?

## 2016-09-07 NOTE — Telephone Encounter (Signed)
Pt informed of results.

## 2016-09-07 NOTE — Telephone Encounter (Signed)
Her blood sugar is well controlled  The other labs were okay

## 2016-09-20 ENCOUNTER — Ambulatory Visit: Payer: Medicare Other | Admitting: Podiatry

## 2016-09-21 ENCOUNTER — Other Ambulatory Visit: Payer: Self-pay | Admitting: Internal Medicine

## 2016-09-21 DIAGNOSIS — J301 Allergic rhinitis due to pollen: Secondary | ICD-10-CM

## 2016-09-22 DIAGNOSIS — H401131 Primary open-angle glaucoma, bilateral, mild stage: Secondary | ICD-10-CM | POA: Diagnosis not present

## 2016-10-18 ENCOUNTER — Ambulatory Visit (INDEPENDENT_AMBULATORY_CARE_PROVIDER_SITE_OTHER): Payer: Medicare Other | Admitting: Podiatry

## 2016-10-18 NOTE — Progress Notes (Signed)
ERRONEOUS ENCOUNTER NO SHOW  

## 2016-10-21 IMAGING — CT CT ABD-PELV W/O CM
2 of 4 series · 16 of 46 positions shown, 18 images · non-contrast
Comparison: 06/10/2007.

CLINICAL DATA: Left upper quadrant abdominal pain. One episode of
vomiting last night.

EXAM:
CT ABDOMEN AND PELVIS WITHOUT CONTRAST
TECHNIQUE: Multidetector CT imaging of the abdomen and pelvis was performed
following the standard protocol without IV contrast.

[Series 2: abd/pel w/o · axial · non-contrast · 0.74mm/px · z∈[+1154,+1554]mm · 13 of 90 slices shown, 15 images]
[im 5/90  soft-tissue]
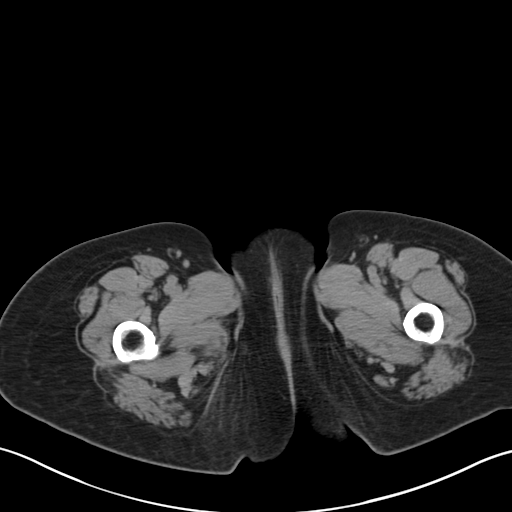
[im 5/90  bone]
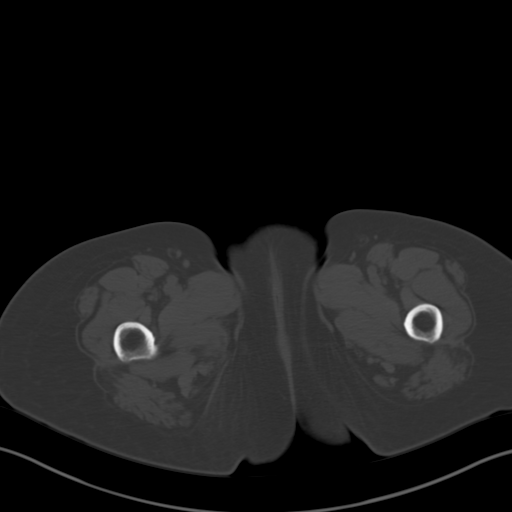
[im 15/90  soft-tissue]
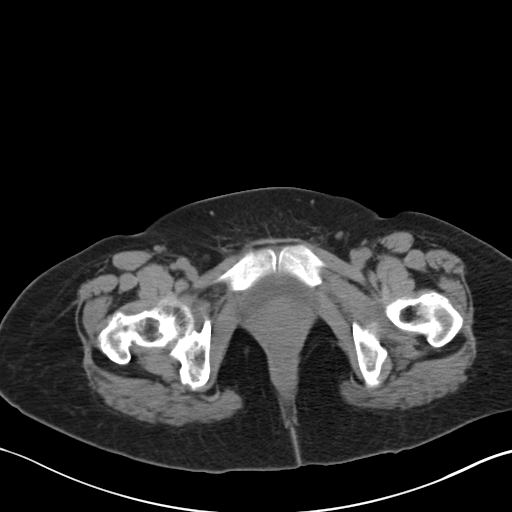
[im 19/90  soft-tissue]
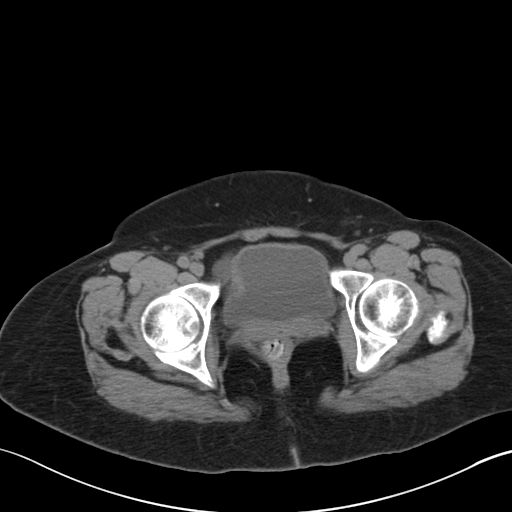
[im 24/90  soft-tissue]
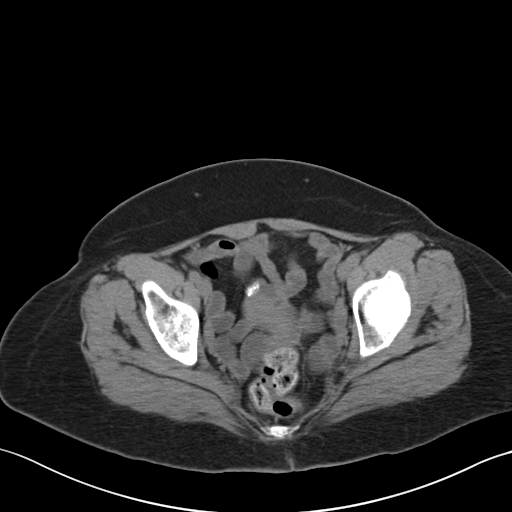
[im 33/90  soft-tissue]
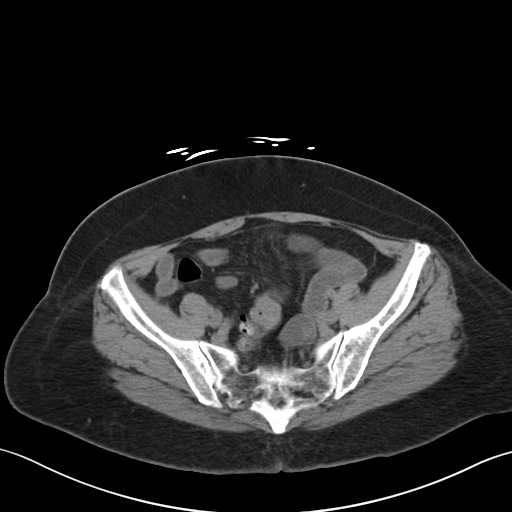
[im 38/90  soft-tissue]
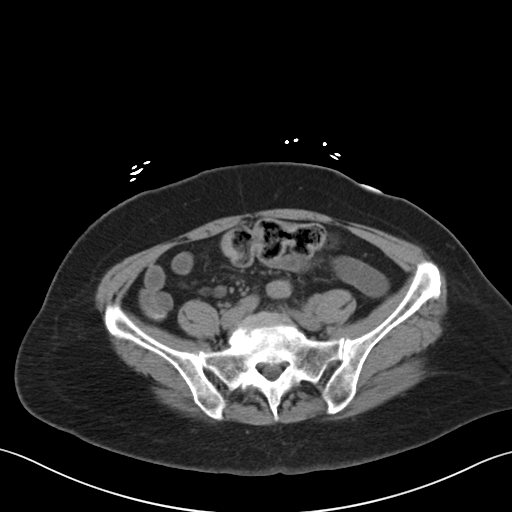
[im 47/90  soft-tissue]
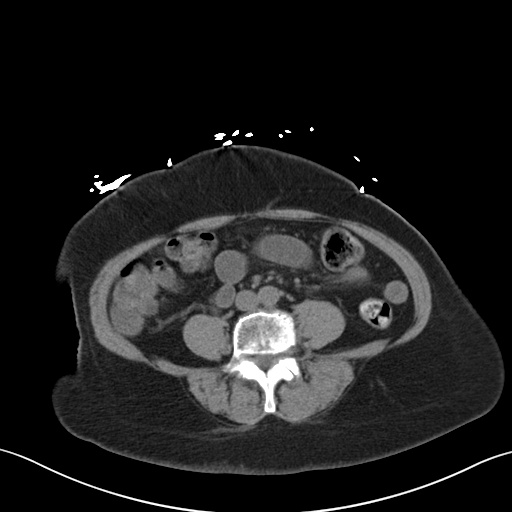
[im 52/90  soft-tissue]
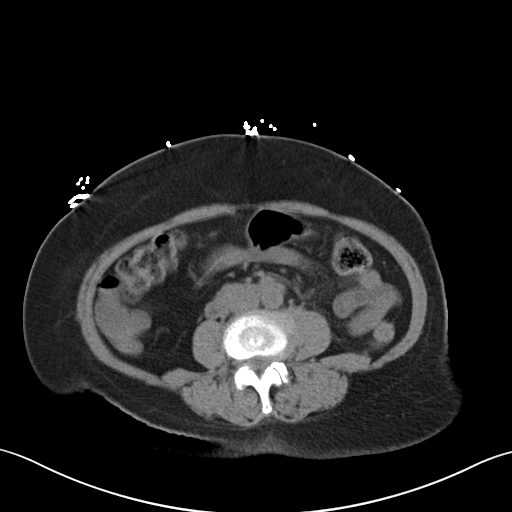
[im 57/90  soft-tissue]
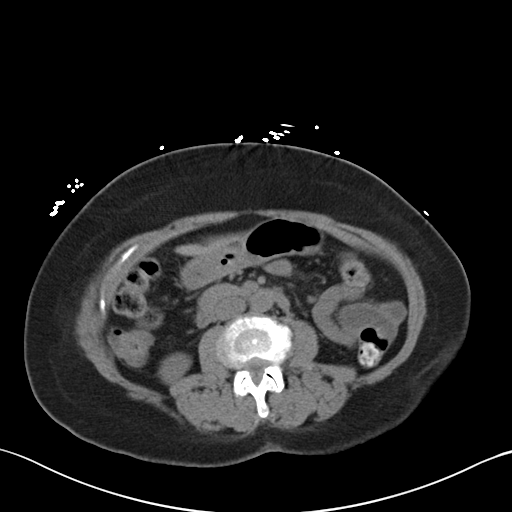
[im 57/90  bone]
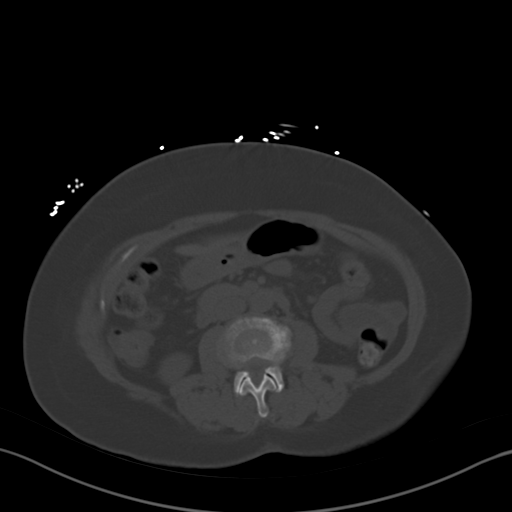
[im 66/90  soft-tissue]
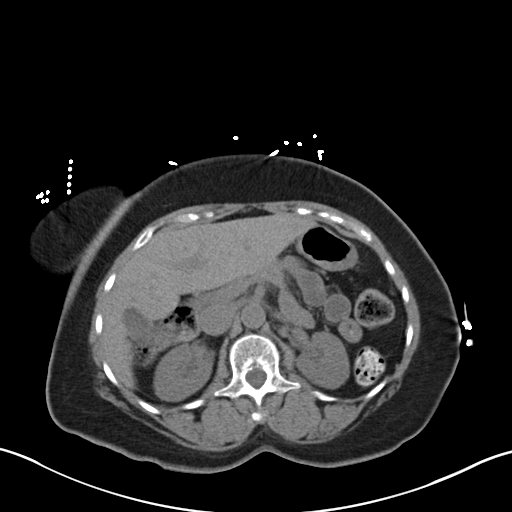
[im 71/90  soft-tissue]
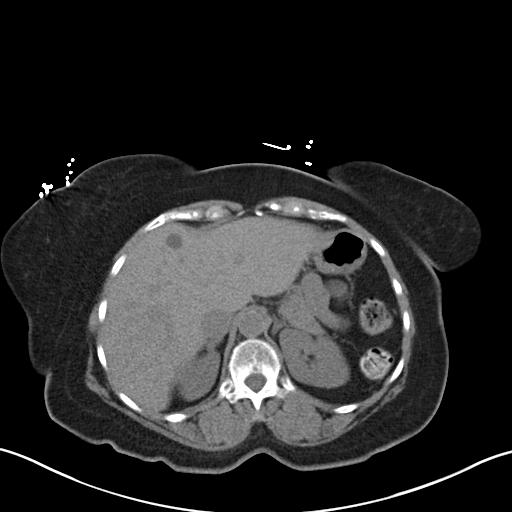
[im 75/90  soft-tissue]
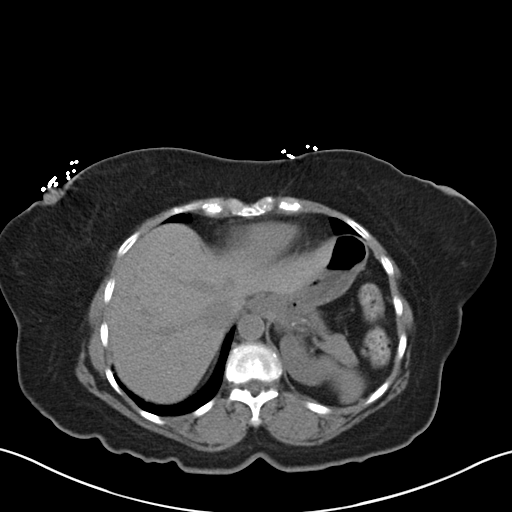
[im 85/90  soft-tissue]
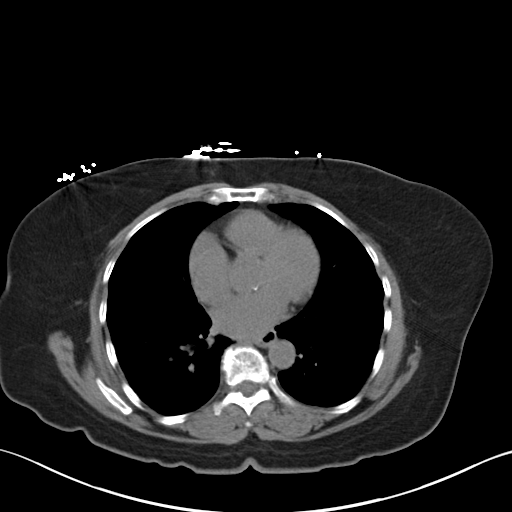

[Series 5: coronal · coronal · 0.74mm/px · 3 of 122 slices shown]
[im 41/122  soft-tissue]
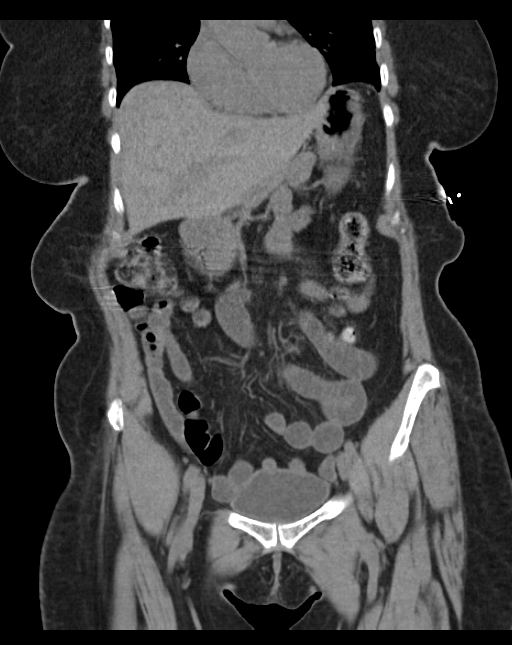
[im 54/122  soft-tissue]
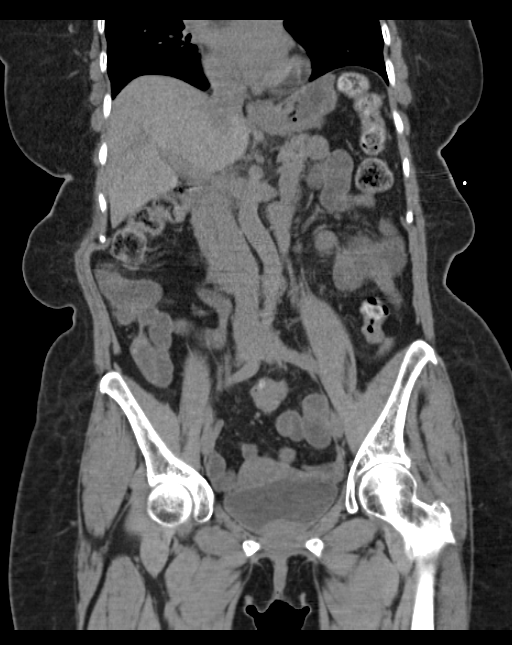
[im 68/122  soft-tissue]
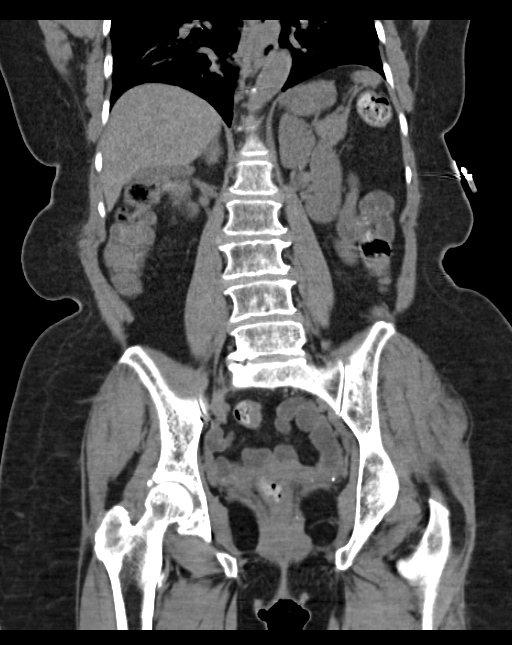

[16 of 46 positions shown; findings below may reference images not displayed]

FINDINGS: Lower chest:  Mild bilateral dependent atelectasis/ scarring.

Hepatobiliary: Little change in previously demonstrated liver cysts.
Normal appearing gallbladder.

Pancreas: No mass or inflammatory process identified on this
un-enhanced exam.

Spleen: Within normal limits in size and appearance.

Adrenals/Urinary Tract: Normal appearing adrenal glands, kidneys,
ureters and urinary bladder. No calculi or hydronephrosis. No
visible mass.

Stomach/Bowel: Mild sigmoid colon diverticulosis. No gastric or
small bowel abnormalities. Surgically absent appendix.

Vascular/Lymphatic: No pathologically enlarged lymph nodes. No
evidence of abdominal aortic aneurysm.

Reproductive: Small calcified uterine fibroid.  No adnexal mass.

Other: None.

Musculoskeletal: Lumbar and lower thoracic spine degenerative
changes.
IMPRESSION: No acute abnormality.

## 2016-11-23 DIAGNOSIS — Z1231 Encounter for screening mammogram for malignant neoplasm of breast: Secondary | ICD-10-CM | POA: Diagnosis not present

## 2016-11-23 DIAGNOSIS — M810 Age-related osteoporosis without current pathological fracture: Secondary | ICD-10-CM | POA: Diagnosis not present

## 2016-11-23 DIAGNOSIS — M8589 Other specified disorders of bone density and structure, multiple sites: Secondary | ICD-10-CM | POA: Diagnosis not present

## 2016-11-23 LAB — HM DEXA SCAN

## 2016-11-23 LAB — HM MAMMOGRAPHY

## 2016-11-28 ENCOUNTER — Encounter: Payer: Self-pay | Admitting: Internal Medicine

## 2016-11-28 LAB — HM DEXA SCAN: HM Dexa Scan: -3.1

## 2016-11-28 NOTE — Progress Notes (Unsigned)
Results entered and sent to scan  

## 2016-11-30 ENCOUNTER — Encounter: Payer: Self-pay | Admitting: Internal Medicine

## 2016-11-30 NOTE — Progress Notes (Signed)
Is it okay for patient to start Prolia?

## 2016-12-27 ENCOUNTER — Other Ambulatory Visit: Payer: Self-pay | Admitting: Internal Medicine

## 2016-12-27 ENCOUNTER — Telehealth: Payer: Self-pay

## 2016-12-27 DIAGNOSIS — M818 Other osteoporosis without current pathological fracture: Secondary | ICD-10-CM

## 2016-12-27 MED ORDER — FOSTEUM PLUS PO CAPS
1.0000 | ORAL_CAPSULE | Freq: Two times a day (BID) | ORAL | 11 refills | Status: DC
Start: 2016-12-27 — End: 2018-01-07

## 2016-12-27 NOTE — Telephone Encounter (Signed)
She needs to let me know who at [facility] she wants to see

## 2016-12-27 NOTE — Telephone Encounter (Signed)
She wants to have referral for oral surgeon and also a referral for ENT (ear problems)---she does not have a doctor's name yet---can you send referrals without a doctor's name?  Please advise, thanks

## 2016-12-27 NOTE — Telephone Encounter (Signed)
No, I can't refer to [facility] without a name  For oral surgery and ENT I need to know the diagnosis codes to attach to the referral

## 2016-12-27 NOTE — Telephone Encounter (Signed)
Patient does not want to start prolia, she will be having extensive dental work soon----but she is requesting a referral to [facility] for help with osteoporosis management---routing to dr Yetta Barre, can you request referral electronically to [facility] or will patient need to come pick up a printed form to carry there with her---please advise, I will call patient back--thanks

## 2016-12-28 NOTE — Telephone Encounter (Signed)
Talked with patient and advised that per dr jones---he would prefer to see patient in the office to look at ears and mouth before placing referrals, we need accurate diagnosis codes---patient has scheduled appt on Wednesday sept 5th--patient repeated back for understanding

## 2017-01-03 ENCOUNTER — Ambulatory Visit (INDEPENDENT_AMBULATORY_CARE_PROVIDER_SITE_OTHER): Payer: Medicare Other | Admitting: Internal Medicine

## 2017-01-03 ENCOUNTER — Other Ambulatory Visit (INDEPENDENT_AMBULATORY_CARE_PROVIDER_SITE_OTHER): Payer: Medicare Other

## 2017-01-03 ENCOUNTER — Encounter: Payer: Self-pay | Admitting: Internal Medicine

## 2017-01-03 VITALS — BP 124/60 | HR 58 | Temp 98.7°F | Ht 61.0 in | Wt 134.0 lb

## 2017-01-03 DIAGNOSIS — E118 Type 2 diabetes mellitus with unspecified complications: Secondary | ICD-10-CM | POA: Diagnosis not present

## 2017-01-03 DIAGNOSIS — J370 Chronic laryngitis: Secondary | ICD-10-CM | POA: Diagnosis not present

## 2017-01-03 DIAGNOSIS — E041 Nontoxic single thyroid nodule: Secondary | ICD-10-CM

## 2017-01-03 DIAGNOSIS — K029 Dental caries, unspecified: Secondary | ICD-10-CM | POA: Insufficient documentation

## 2017-01-03 DIAGNOSIS — I10 Essential (primary) hypertension: Secondary | ICD-10-CM | POA: Diagnosis not present

## 2017-01-03 LAB — BASIC METABOLIC PANEL
BUN: 12 mg/dL (ref 6–23)
CALCIUM: 9.6 mg/dL (ref 8.4–10.5)
CO2: 31 meq/L (ref 19–32)
Chloride: 101 mEq/L (ref 96–112)
Creatinine, Ser: 0.87 mg/dL (ref 0.40–1.20)
GFR: 80.11 mL/min (ref >60.00)
GLUCOSE: 114 mg/dL — AB (ref 70–99)
Potassium: 4.3 mEq/L (ref 3.5–5.1)
SODIUM: 138 meq/L (ref 135–145)

## 2017-01-03 LAB — HEMOGLOBIN A1C: Hgb A1c MFr Bld: 6.3 % (ref 4.6–6.5)

## 2017-01-03 LAB — THYROID PANEL WITH TSH
Free Thyroxine Index: 2 (ref 1.4–3.8)
T3 Uptake: 40 % — ABNORMAL HIGH (ref 22–35)
T4 TOTAL: 5.1 ug/dL (ref 5.1–11.9)
TSH: 0.8 mIU/L (ref 0.40–4.50)

## 2017-01-03 NOTE — Patient Instructions (Signed)

## 2017-01-03 NOTE — Progress Notes (Signed)
Subjective:  Patient ID: Brianna Mcdonald, female    DOB: 1932/07/29  Age: 83 y.o. MRN: [id number]  CC: Hypertension and Diabetes   HPI AUBREA MEIXNER presents for f/up - She complains of decaying teeth and wants to see an oral Careers adviser. She complains of chronic laryngitis and wants to see an ENT doctor. She tells me her blood pressure and blood sugars have been well controlled.  Outpatient Medications Prior to Visit  Medication Sig Dispense Refill  . albuterol (PROAIR HFA) 108 (90 Base) MCG/ACT inhaler Inhale 1 puff into the lungs every 6 (six) hours as needed for wheezing or shortness of breath (or with excersize). 18 g 0  . clotrimazole-betamethasone (LOTRISONE) cream     . Cyanocobalamin (VITAMIN B-12) 1000 MCG SUBL Place 1 tablet under the tongue daily.     Marland Kitchen desloratadine (CLARINEX) 5 MG tablet Take 1 tablet (5 mg total) by mouth daily. 90 tablet 1  . Dietary Management Product (FOSTEUM PLUS) CAPS Take 1 capsule by mouth 2 (two) times daily. 60 capsule 11  . fish oil-omega-3 fatty acids 1000 MG capsule Take 1 capsule by mouth daily.     . fluticasone (FLONASE) 50 MCG/ACT nasal spray USE TWO SPRAY(S) IN EACH NOSTRIL ONCE DAILY 16 g 11  . lansoprazole (PREVACID) 30 MG capsule TAKE ONE CAPSULE BY MOUTH ONCE DAILY 30 capsule 11  . metoprolol succinate (TOPROL-XL) 25 MG 24 hr tablet TAKE ONE TABLET BY MOUTH ONCE DAILY 30 tablet 11  . selenium 50 MCG TABS tablet Take 50 mcg by mouth daily. Patient states that she is takin g 200 mcg.    . traMADol (ULTRAM) 50 MG tablet Take 1 tablet (50 mg total) by mouth every 12 (twelve) hours as needed. 60 tablet 1  . promethazine (PHENERGAN) 12.5 MG tablet Take 1 tablet (12.5 mg total) by mouth every 6 (six) hours as needed for nausea or vomiting. 25 tablet 0  . Tetrahydrozoline HCl (VISION CLEAR OP) Apply 1 tablet to eye daily.     No facility-administered medications prior to visit.     ROS Review of Systems  Constitutional: Negative.   Negative for appetite change, diaphoresis, fatigue and unexpected weight change.  HENT: Positive for voice change. Negative for facial swelling and trouble swallowing.   Eyes: Negative.  Negative for visual disturbance.  Respiratory: Negative.  Negative for cough, chest tightness, shortness of breath and wheezing.   Cardiovascular: Negative.  Negative for chest pain, palpitations and leg swelling.  Gastrointestinal: Negative for abdominal pain, blood in stool, constipation, diarrhea, nausea and vomiting.  Endocrine: Negative.  Negative for polydipsia, polyphagia and polyuria.  Genitourinary: Negative.  Negative for difficulty urinating.  Musculoskeletal: Negative.  Negative for back pain and myalgias.  Skin: Negative.  Negative for color change and rash.  Allergic/Immunologic: Negative.   Neurological: Negative.  Negative for dizziness, weakness, light-headedness and headaches.  Hematological: Negative for adenopathy. Does not bruise/bleed easily.  Psychiatric/Behavioral: Negative.     Objective:  BP 124/60 (BP Location: Left Arm, Patient Position: Sitting, Cuff Size: Normal)   Pulse (!) 58   Temp 98.7 F (37.1 C) (Oral)   Ht 5\' 1"  (1.549 m)   Wt 134 lb (60.8 kg)   SpO2 99%   BMI 25.32 kg/m   BP Readings from Last 3 Encounters:  01/03/17 124/60  08/28/16 140/70  08/21/16 118/70    Wt Readings from Last 3 Encounters:  01/03/17 134 lb (60.8 kg)  08/28/16 136 lb (61.7 kg)  08/21/16  134 lb 6 oz (61 kg)    Physical Exam  Constitutional: She is oriented to person, place, and time. No distress.  She has a mildly raspy voice  HENT:  Mouth/Throat: Oropharynx is clear and moist. No trismus in the jaw. Dental caries present. No dental abscesses. No oropharyngeal exudate.  She has significant tooth decay and tooth loss. There is no evidence of infection at this time.  Eyes: Conjunctivae are normal. Right eye exhibits no discharge. Left eye exhibits no discharge. No scleral icterus.    Neck: Normal range of motion. Neck supple. No JVD present. No thyroid mass and no thyromegaly present.  Cardiovascular: Normal rate, regular rhythm and intact distal pulses.  Exam reveals no gallop and no friction rub.   No murmur heard. Pulmonary/Chest: Effort normal and breath sounds normal. No respiratory distress. She has no wheezes. She has no rales. She exhibits no tenderness.  Abdominal: Soft. Bowel sounds are normal. She exhibits no distension and no mass. There is no tenderness. There is no rebound and no guarding.  Musculoskeletal: Normal range of motion. She exhibits no edema, tenderness or deformity.  Lymphadenopathy:    She has no cervical adenopathy.  Neurological: She is alert and oriented to person, place, and time.  Skin: Skin is warm and dry. No rash noted. She is not diaphoretic. No erythema. No pallor.  Vitals reviewed.   Lab Results  Component Value Date   WBC 5.2 02/21/2016   HGB 12.8 02/21/2016   HCT 37.5 02/21/2016   PLT 188.0 02/21/2016   GLUCOSE 114 (H) 01/03/2017   CHOL 203 (H) 02/21/2016   TRIG 64.0 02/21/2016   HDL 67.80 02/21/2016   LDLDIRECT 144.4 10/30/2012   LDLCALC 122 (H) 02/21/2016   ALT 13 02/21/2016   AST 20 02/21/2016   NA 138 01/03/2017   K 4.3 01/03/2017   CL 101 01/03/2017   CREATININE 0.87 01/03/2017   BUN 12 01/03/2017   CO2 31 01/03/2017   TSH 0.80 01/03/2017   HGBA1C 6.3 01/03/2017   MICROALBUR <0.7 02/21/2016    Ct Abdomen Pelvis Wo Contrast  Result Date: 11/09/2015 CLINICAL DATA:  Left upper quadrant abdominal pain. One episode of vomiting last night. EXAM: CT ABDOMEN AND PELVIS WITHOUT CONTRAST TECHNIQUE: Multidetector CT imaging of the abdomen and pelvis was performed following the standard protocol without IV contrast. COMPARISON:  06/10/2007. FINDINGS: Lower chest:  Mild bilateral dependent atelectasis/ scarring. Hepatobiliary: Little change in previously demonstrated liver cysts. Normal appearing gallbladder. Pancreas: No  mass or inflammatory process identified on this un-enhanced exam. Spleen: Within normal limits in size and appearance. Adrenals/Urinary Tract: Normal appearing adrenal glands, kidneys, ureters and urinary bladder. No calculi or hydronephrosis. No visible mass. Stomach/Bowel: Mild sigmoid colon diverticulosis. No gastric or small bowel abnormalities. Surgically absent appendix. Vascular/Lymphatic: No pathologically enlarged lymph nodes. No evidence of abdominal aortic aneurysm. Reproductive: Small calcified uterine fibroid.  No adnexal mass. Other: None. Musculoskeletal: Lumbar and lower thoracic spine degenerative changes. IMPRESSION: No acute abnormality. Electronically Signed   By: Beckie SaltsSteven  Reid M.D.   On: 11/09/2015 09:38   Dg Chest 2 View  Result Date: 11/09/2015 CLINICAL DATA:  83 year old female with a history of left upper quadrant pain EXAM: CHEST  2 VIEW COMPARISON:  04/02/2013, 12/01/2007 FINDINGS: Cardiomediastinal silhouette unchanged in size and contour. No confluent airspace disease, pneumothorax, or pleural effusion. Unremarkable appearance of the upper abdomen. IMPRESSION: No radiographic evidence of acute cardiopulmonary disease, background chronic changes. Aortic atherosclerosis. Signed, Yvone NeuJaime S. Loreta AveWagner, DO [facility] Electronically Signed   By: Gilmer Mor D.O.   On: 11/09/2015 09:31    Assessment & Plan:   Sesilia was seen today for hypertension and diabetes.  Diagnoses and all orders for this visit:  Essential hypertension, benign- Her blood pressures well controlled. Electrolytes and renal function are normal. -     Basic metabolic panel; Future  Type 2 diabetes mellitus with complication, without long-term current use of insulin (HCC)- her A1c is down to 6.3%. Her blood sugars are well-controlled. -     Basic metabolic panel; Future -     Hemoglobin A1c; Future  Left thyroid nodule- there is no palpable nodule today  and no associated symptoms. Her TFTs are normal. -     Thyroid Panel With TSH; Future  Dental decay -     Ambulatory referral to Oral Maxillofacial Surgery  Laryngitis, chronic -     Ambulatory referral to ENT   I have discontinued Ms. Bilotti's Tetrahydrozoline HCl (VISION CLEAR OP) and promethazine. I am also having her maintain her fish oil-omega-3 fatty acids, Vitamin B-12, selenium, metoprolol succinate, traMADol, clotrimazole-betamethasone, lansoprazole, albuterol, desloratadine, fluticasone, and FOSTEUM PLUS.  No orders of the defined types were placed in this encounter.    Follow-up: Return in about 6 months (around 07/03/2017).  Sanda Linger, MD

## 2017-01-05 ENCOUNTER — Telehealth: Payer: Self-pay | Admitting: Internal Medicine

## 2017-01-05 NOTE — Telephone Encounter (Signed)
Patient requesting call back with lab results.

## 2017-01-09 NOTE — Telephone Encounter (Signed)
Copy mail to pt address on file.

## 2017-01-09 NOTE — Telephone Encounter (Signed)
Pt call back. I gave her MD response. She asked if a copy of the labs could be mailed to her.

## 2017-01-09 NOTE — Telephone Encounter (Signed)
Lab Results  Component Value Date   WBC 5.2 02/21/2016   HGB 12.8 02/21/2016   HCT 37.5 02/21/2016   PLT 188.0 02/21/2016   GLUCOSE 114 (H) 01/03/2017   CHOL 203 (H) 02/21/2016   TRIG 64.0 02/21/2016   HDL 67.80 02/21/2016   LDLDIRECT 144.4 10/30/2012   LDLCALC 122 (H) 02/21/2016   ALT 13 02/21/2016   AST 20 02/21/2016   NA 138 01/03/2017   K 4.3 01/03/2017   CL 101 01/03/2017   CREATININE 0.87 01/03/2017   BUN 12 01/03/2017   CO2 31 01/03/2017   TSH 0.80 01/03/2017   HGBA1C 6.3 01/03/2017   MICROALBUR <0.7 02/21/2016    Her labs were okay I don't see any concerns

## 2017-01-22 DIAGNOSIS — R42 Dizziness and giddiness: Secondary | ICD-10-CM | POA: Diagnosis not present

## 2017-01-22 DIAGNOSIS — R490 Dysphonia: Secondary | ICD-10-CM | POA: Diagnosis not present

## 2017-01-22 DIAGNOSIS — R0981 Nasal congestion: Secondary | ICD-10-CM | POA: Diagnosis not present

## 2017-01-22 DIAGNOSIS — H938X2 Other specified disorders of left ear: Secondary | ICD-10-CM | POA: Diagnosis not present

## 2017-01-23 ENCOUNTER — Encounter: Payer: Self-pay | Admitting: Internal Medicine

## 2017-01-25 ENCOUNTER — Other Ambulatory Visit: Payer: Self-pay | Admitting: Internal Medicine

## 2017-01-25 ENCOUNTER — Telehealth: Payer: Self-pay | Admitting: *Deleted

## 2017-01-25 DIAGNOSIS — M546 Pain in thoracic spine: Secondary | ICD-10-CM

## 2017-01-25 DIAGNOSIS — M5412 Radiculopathy, cervical region: Secondary | ICD-10-CM

## 2017-01-25 MED ORDER — TRAMADOL HCL 50 MG PO TABS: 50.0000 mg | ORAL_TABLET | Freq: Two times a day (BID) | ORAL | 3 refills | Status: DC | PRN

## 2017-01-25 NOTE — Telephone Encounter (Signed)
Notified pt MD ok refill has been faxed to [facility]...Raechel Chute

## 2017-01-25 NOTE — Telephone Encounter (Signed)
Received call pt requesting refill on her Tramadol. Check [place] registry last filled 05/17/2016...Raechel Chute

## 2017-01-25 NOTE — Telephone Encounter (Signed)
done

## 2017-01-31 DIAGNOSIS — J3089 Other allergic rhinitis: Secondary | ICD-10-CM | POA: Diagnosis not present

## 2017-01-31 DIAGNOSIS — H6983 Other specified disorders of Eustachian tube, bilateral: Secondary | ICD-10-CM | POA: Insufficient documentation

## 2017-01-31 DIAGNOSIS — J383 Other diseases of vocal cords: Secondary | ICD-10-CM | POA: Diagnosis not present

## 2017-02-20 ENCOUNTER — Encounter: Payer: Self-pay | Admitting: Internal Medicine

## 2017-02-20 ENCOUNTER — Ambulatory Visit (INDEPENDENT_AMBULATORY_CARE_PROVIDER_SITE_OTHER): Payer: Medicare Other | Admitting: Internal Medicine

## 2017-02-20 ENCOUNTER — Other Ambulatory Visit (INDEPENDENT_AMBULATORY_CARE_PROVIDER_SITE_OTHER): Payer: Medicare Other

## 2017-02-20 VITALS — BP 110/58 | HR 57 | Temp 97.8°F | Resp 16 | Ht 61.0 in | Wt 131.0 lb

## 2017-02-20 DIAGNOSIS — E785 Hyperlipidemia, unspecified: Secondary | ICD-10-CM

## 2017-02-20 DIAGNOSIS — I10 Essential (primary) hypertension: Secondary | ICD-10-CM | POA: Diagnosis not present

## 2017-02-20 DIAGNOSIS — E118 Type 2 diabetes mellitus with unspecified complications: Secondary | ICD-10-CM

## 2017-02-20 DIAGNOSIS — Z23 Encounter for immunization: Secondary | ICD-10-CM

## 2017-02-20 LAB — MICROALBUMIN / CREATININE URINE RATIO
Creatinine,U: 142.4 mg/dL
Microalb Creat Ratio: 0.5 mg/g (ref 0.0–30.0)
Microalb, Ur: <0.7 mg/dL (ref 0.0–1.9)

## 2017-02-20 LAB — LIPID PANEL
CHOLESTEROL: 181 mg/dL (ref 0–200)
HDL: 60.4 mg/dL (ref >39.00)
LDL CALC: 108 mg/dL — AB (ref 0–99)
NonHDL: 120.37
Total CHOL/HDL Ratio: 3
Triglycerides: 63 mg/dL (ref 0.0–149.0)
VLDL: 12.6 mg/dL (ref 0.0–40.0)

## 2017-02-20 NOTE — Progress Notes (Signed)
Subjective:  Patient ID: Brianna Mcdonald, female    DOB: 04/22/1933  Age: 83 y.o. MRN: [id number]  CC: Hypertension; Diabetes; and Hyperlipidemia   HPI Brianna Mcdonald presents for f/up - she feels well and offers no complaints.  Outpatient Medications Prior to Visit  Medication Sig Dispense Refill  . albuterol (PROAIR HFA) 108 (90 Base) MCG/ACT inhaler Inhale 1 puff into the lungs every 6 (six) hours as needed for wheezing or shortness of breath (or with excersize). 18 g 0  . Cyanocobalamin (VITAMIN B-12) 1000 MCG SUBL Place 1 tablet under the tongue daily.     Marland Kitchen. desloratadine (CLARINEX) 5 MG tablet Take 1 tablet (5 mg total) by mouth daily. 90 tablet 1  . fish oil-omega-3 fatty acids 1000 MG capsule Take 1 capsule by mouth daily.     . fluticasone (FLONASE) 50 MCG/ACT nasal spray USE TWO SPRAY(S) IN EACH NOSTRIL ONCE DAILY 16 g 11  . lansoprazole (PREVACID) 30 MG capsule TAKE ONE CAPSULE BY MOUTH ONCE DAILY 30 capsule 11  . metoprolol succinate (TOPROL-XL) 25 MG 24 hr tablet TAKE ONE TABLET BY MOUTH ONCE DAILY 30 tablet 11  . selenium 50 MCG TABS tablet Take 50 mcg by mouth daily. Patient states that she is takin g 200 mcg.    . clotrimazole-betamethasone (LOTRISONE) cream     . Dietary Management Product (FOSTEUM PLUS) CAPS Take 1 capsule by mouth 2 (two) times daily. (Patient not taking: Reported on 02/20/2017) 60 capsule 11  . traMADol (ULTRAM) 50 MG tablet Take 1 tablet (50 mg total) by mouth every 12 (twelve) hours as needed. 60 tablet 3   No facility-administered medications prior to visit.     ROS Review of Systems  Constitutional: Negative.  Negative for appetite change, diaphoresis, fatigue and unexpected weight change.  HENT: Negative.  Negative for trouble swallowing.   Eyes: Negative.   Respiratory: Negative.  Negative for cough, chest tightness, shortness of breath and wheezing.   Cardiovascular: Negative for chest pain, palpitations and leg swelling.    Gastrointestinal: Negative.  Negative for abdominal pain, constipation, diarrhea, nausea and vomiting.  Endocrine: Negative.   Genitourinary: Negative.  Negative for decreased urine volume, difficulty urinating, dysuria, hematuria and urgency.  Musculoskeletal: Negative.  Negative for arthralgias and myalgias.  Skin: Negative.   Allergic/Immunologic: Negative.   Neurological: Negative.  Negative for dizziness, weakness and headaches.  Hematological: Negative for adenopathy. Does not bruise/bleed easily.  Psychiatric/Behavioral: Negative.     Objective:  BP (!) 110/58 (BP Location: Left Arm, Patient Position: Sitting, Cuff Size: Normal)   Pulse (!) 57   Temp 97.8 F (36.6 C) (Oral)   Resp 16   Ht 5\' 1"  (1.549 m)   Wt 131 lb (59.4 kg)   SpO2 97%   BMI 24.75 kg/m   BP Readings from Last 3 Encounters:  02/20/17 (!) 110/58  01/03/17 124/60  08/28/16 140/70    Wt Readings from Last 3 Encounters:  02/20/17 131 lb (59.4 kg)  01/03/17 134 lb (60.8 kg)  08/28/16 136 lb (61.7 kg)    Physical Exam  Constitutional: She is oriented to person, place, and time. No distress.  HENT:  Mouth/Throat: Oropharynx is clear and moist. No oropharyngeal exudate.  Eyes: Conjunctivae are normal. Right eye exhibits no discharge. Left eye exhibits no discharge. No scleral icterus.  Neck: Normal range of motion. Neck supple. No JVD present. No thyromegaly present.  Cardiovascular: Normal rate, regular rhythm and intact distal pulses.  Exam reveals  no gallop and no friction rub.   No murmur heard. Pulmonary/Chest: Effort normal and breath sounds normal. No respiratory distress. She has no wheezes. She has no rales. She exhibits no tenderness.  Abdominal: Soft. Bowel sounds are normal. She exhibits no distension and no mass. There is no tenderness. There is no rebound and no guarding.  Musculoskeletal: Normal range of motion. She exhibits no edema, tenderness or deformity.  Lymphadenopathy:    She has  no cervical adenopathy.  Neurological: She is alert and oriented to person, place, and time.  Skin: Skin is warm and dry. She is not diaphoretic.  Vitals reviewed.   Lab Results  Component Value Date   WBC 5.2 02/21/2016   HGB 12.8 02/21/2016   HCT 37.5 02/21/2016   PLT 188.0 02/21/2016   GLUCOSE 114 (H) 01/03/2017   CHOL 181 02/20/2017   TRIG 63.0 02/20/2017   HDL 60.40 02/20/2017   LDLDIRECT 144.4 10/30/2012   LDLCALC 108 (H) 02/20/2017   ALT 13 02/21/2016   AST 20 02/21/2016   NA 138 01/03/2017   K 4.3 01/03/2017   CL 101 01/03/2017   CREATININE 0.87 01/03/2017   BUN 12 01/03/2017   CO2 31 01/03/2017   TSH 0.80 01/03/2017   HGBA1C 6.3 01/03/2017   MICROALBUR <0.7 02/20/2017    Ct Abdomen Pelvis Wo Contrast  Result Date: 11/09/2015 CLINICAL DATA:  Left upper quadrant abdominal pain. One episode of vomiting last night. EXAM: CT ABDOMEN AND PELVIS WITHOUT CONTRAST TECHNIQUE: Multidetector CT imaging of the abdomen and pelvis was performed following the standard protocol without IV contrast. COMPARISON:  06/10/2007. FINDINGS: Lower chest:  Mild bilateral dependent atelectasis/ scarring. Hepatobiliary: Little change in previously demonstrated liver cysts. Normal appearing gallbladder. Pancreas: No mass or inflammatory process identified on this un-enhanced exam. Spleen: Within normal limits in size and appearance. Adrenals/Urinary Tract: Normal appearing adrenal glands, kidneys, ureters and urinary bladder. No calculi or hydronephrosis. No visible mass. Stomach/Bowel: Mild sigmoid colon diverticulosis. No gastric or small bowel abnormalities. Surgically absent appendix. Vascular/Lymphatic: No pathologically enlarged lymph nodes. No evidence of abdominal aortic aneurysm. Reproductive: Small calcified uterine fibroid.  No adnexal mass. Other: None. Musculoskeletal: Lumbar and lower thoracic spine degenerative changes. IMPRESSION: No acute abnormality. Electronically Signed   By: Beckie Salts M.D.   On: 11/09/2015 09:38   Dg Chest 2 View  Result Date: 11/09/2015 CLINICAL DATA:  83 year old female with a history of left upper quadrant pain EXAM: CHEST  2 VIEW COMPARISON:  04/02/2013, 12/01/2007 FINDINGS: Cardiomediastinal silhouette unchanged in size and contour. No confluent airspace disease, pneumothorax, or pleural effusion. Unremarkable appearance of the upper abdomen. IMPRESSION: No radiographic evidence of acute cardiopulmonary disease, background chronic changes. Aortic atherosclerosis. Signed, Yvone Neu. Loreta Ave, DO [facility] Radiology Electronically Signed   By: Gilmer Mor D.O.   On: 11/09/2015 09:31    Assessment & Plan:   Aija was seen today for hypertension, diabetes and hyperlipidemia.  Diagnoses and all orders for this visit:  Essential hypertension, benign- Her blood pressure is well controlled.  Electrolytes and renal function are normal.  Type 2 diabetes mellitus with complication, without long-term current use of insulin (HCC)-her A1c is down to 6.3%.  Blood sugars are adequately well controlled. -     Microalbumin / creatinine urine ratio; Future  Hyperlipidemia with target LDL less than 100-she has achieved her LDL goal is doing well on the statin. -     Lipid panel; Future  Need for  influenza vaccination -     Flu vaccine HIGH DOSE PF (Fluzone High dose)   I am having Ms. Carboni maintain her fish oil-omega-3 fatty acids, Vitamin B-12, selenium, metoprolol succinate, clotrimazole-betamethasone, lansoprazole, albuterol, desloratadine, fluticasone, FOSTEUM PLUS, traMADol, and DYMISTA.  Meds ordered this encounter  Medications  . DYMISTA 137-50 MCG/ACT SUSP     Follow-up: Return in about 6 months (around 08/21/2017).  Sanda Linger, MD

## 2017-02-20 NOTE — Patient Instructions (Signed)

## 2017-03-01 ENCOUNTER — Telehealth: Payer: Self-pay | Admitting: Internal Medicine

## 2017-03-01 ENCOUNTER — Other Ambulatory Visit: Payer: Self-pay | Admitting: Internal Medicine

## 2017-03-01 NOTE — Telephone Encounter (Signed)
Pt called checking on her recent lab results.

## 2017-03-01 NOTE — Telephone Encounter (Signed)
Pt informed of lab result

## 2017-03-01 NOTE — Telephone Encounter (Signed)
Her cholesterol level was okay I don't see any concerns with her labs

## 2017-04-11 ENCOUNTER — Ambulatory Visit (INDEPENDENT_AMBULATORY_CARE_PROVIDER_SITE_OTHER): Payer: Medicare Other | Admitting: Internal Medicine

## 2017-04-11 ENCOUNTER — Encounter: Payer: Self-pay | Admitting: Internal Medicine

## 2017-04-11 VITALS — BP 132/64 | HR 76 | Temp 97.8°F | Resp 16 | Wt 133.0 lb

## 2017-04-11 DIAGNOSIS — J069 Acute upper respiratory infection, unspecified: Secondary | ICD-10-CM

## 2017-04-11 MED ORDER — DOXYCYCLINE HYCLATE 100 MG PO TABS: 100.0000 mg | ORAL_TABLET | Freq: Two times a day (BID) | ORAL | 0 refills | Status: DC

## 2017-04-11 NOTE — Patient Instructions (Addendum)
Take the antibiotic as prescribed.  Continue over the counter cold medications.   Call if no improvement

## 2017-04-11 NOTE — Assessment & Plan Note (Signed)
Symptoms for 10 + days Will start doxycycline BID x 10 days Discussed otc cold medications Rest, fluids  Call if no improvement

## 2017-04-11 NOTE — Progress Notes (Signed)
Subjective:    Patient ID: Brianna Mcdonald, female    DOB: 03-06-1933, 83 y.o.   MRN: [id number]  HPI She is here for an acute visit for cold symptoms.  Her symptoms started about 10 days ago.   She is experiencing coughing that is productive of sputum, sneezing, fever,  Headaches, lightheadedness, nasal congestion, sinus pressure, nausea and vomiting from coughing and some body aches.    She denies sob and wheeze.    She has taken a mucus release medication, tylenol.   Medications and allergies reviewed with patient and updated if appropriate.  Patient Active Problem List   Diagnosis Date Noted  . Dental decay 01/03/2017  . Laryngitis, chronic 01/03/2017  . Routine general medical examination at a health care facility 03/14/2015  . Osteoporosis 02/13/2012  . Cervical radiculitis 01/23/2012  . Left thyroid nodule 07/12/2011  . Type II diabetes mellitus with manifestations (HCC) 04/07/2011  . Hyperlipidemia with target LDL less than 100 12/26/2010  . Sleep apnea 08/23/2010  . Essential hypertension, benign 09/23/2008  . Allergic rhinitis 10/29/2007  . GERD 10/29/2007  . Irritable bowel syndrome 09/27/2007  . HEPATIC CYST 09/27/2007    Current Outpatient Medications on File Prior to Visit  Medication Sig Dispense Refill  . albuterol (PROAIR HFA) 108 (90 Base) MCG/ACT inhaler Inhale 1 puff into the lungs every 6 (six) hours as needed for wheezing or shortness of breath (or with excersize). 18 g 0  . clotrimazole-betamethasone (LOTRISONE) cream     . Cyanocobalamin (VITAMIN B-12) 1000 MCG SUBL Place 1 tablet under the tongue daily.     Marland Kitchen. desloratadine (CLARINEX) 5 MG tablet TAKE 1 TABLET BY MOUTH ONCE DAILY 90 tablet 1  . Dietary Management Product (FOSTEUM PLUS) CAPS Take 1 capsule by mouth 2 (two) times daily. 60 capsule 11  . DYMISTA 137-50 MCG/ACT SUSP     . fish oil-omega-3 fatty acids 1000 MG capsule Take 1 capsule by mouth daily.     . fluticasone (FLONASE) 50  MCG/ACT nasal spray USE TWO SPRAY(S) IN EACH NOSTRIL ONCE DAILY 16 g 11  . lansoprazole (PREVACID) 30 MG capsule TAKE ONE CAPSULE BY MOUTH ONCE DAILY 30 capsule 11  . metoprolol succinate (TOPROL-XL) 25 MG 24 hr tablet TAKE ONE TABLET BY MOUTH ONCE DAILY 30 tablet 11  . selenium 50 MCG TABS tablet Take 50 mcg by mouth daily. Patient states that she is takin g 200 mcg.    . traMADol (ULTRAM) 50 MG tablet Take 1 tablet (50 mg total) by mouth every 12 (twelve) hours as needed. 60 tablet 3   No current facility-administered medications on file prior to visit.     Past Medical History:  Diagnosis Date  . Allergy   . Bronchitis, not specified as acute or chronic   . Chest pain, unspecified   . Diabetes mellitus   . Edema   . GERD (gastroesophageal reflux disease)   . Hyperlipidemia   . Hypertension   . Irritable bowel syndrome   . Other specified disorders of liver   . Palpitations     Past Surgical History:  Procedure Laterality Date  . APPENDECTOMY    . DILATION AND CURETTAGE OF UTERUS     x 3  . WRIST SURGERY      Social History   Socioeconomic History  . Marital status: Widowed    Spouse name: None  . Number of children: None  . Years of education: None  . Highest education level:  None  Social Needs  . Financial resource strain: None  . Food insecurity - worry: None  . Food insecurity - inability: None  . Transportation needs - medical: None  . Transportation needs - non-medical: None  Occupational History  . Occupation: retired  Tobacco Use  . Smoking status: Never Smoker  . Smokeless tobacco: Never Used  Substance and Sexual Activity  . Alcohol use: No  . Drug use: No  . Sexual activity: Not Currently    Birth control/protection: Post-menopausal  Other Topics Concern  . None  Social History Narrative   Adopted 1 daughter   Lives alone   Widowed   retired    Family History  Problem Relation Age of Onset  . Asthma Mother   . Arthritis Mother         rheumatism  . Allergies Sister   . Drug abuse Other   . Allergies Other   . Arthritis Other   . Cancer Brother        tear ducts.     Review of Systems  Constitutional: Positive for chills and fever.  HENT: Positive for congestion, sinus pressure and sneezing. Negative for ear pain, postnasal drip, sinus pain and sore throat.   Respiratory: Positive for cough (productive). Negative for shortness of breath and wheezing.   Gastrointestinal: Positive for nausea (from cough) and vomiting (from cough). Negative for diarrhea.  Musculoskeletal: Positive for myalgias.  Neurological: Positive for light-headedness and headaches.       Objective:   Vitals:   04/11/17 1431  BP: 132/64  Pulse: 76  Resp: 16  Temp: 97.8 F (36.6 C)  SpO2: 94%   Filed Weights   04/11/17 1431  Weight: 133 lb (60.3 kg)   Body mass index is 25.13 kg/m.  Wt Readings from Last 3 Encounters:  04/11/17 133 lb (60.3 kg)  02/20/17 131 lb (59.4 kg)  01/03/17 134 lb (60.8 kg)     Physical Exam GENERAL APPEARANCE: Appears stated age, well appearing, NAD EYES: conjunctiva clear, no icterus HEENT: bilateral tympanic membranes and ear canals normal, oropharynx with mild erythema, no thyromegaly, trachea midline, no cervical or supraclavicular lymphadenopathy LUNGS: Clear to auscultation without wheeze or crackles, unlabored breathing, good air entry bilaterally CARDIOVASCULAR: Normal S1,S2 without murmurs, no edema SKIN: warm, dry        Assessment & Plan:   See Problem List for Assessment and Plan of chronic medical problems.

## 2017-05-02 ENCOUNTER — Other Ambulatory Visit: Payer: Self-pay | Admitting: Internal Medicine

## 2017-07-03 ENCOUNTER — Ambulatory Visit (INDEPENDENT_AMBULATORY_CARE_PROVIDER_SITE_OTHER): Payer: Medicare Other | Admitting: Internal Medicine

## 2017-07-03 ENCOUNTER — Encounter: Payer: Self-pay | Admitting: Internal Medicine

## 2017-07-03 VITALS — BP 132/62 | HR 47 | Temp 98.0°F | Resp 16 | Ht 61.0 in | Wt 132.2 lb

## 2017-07-03 DIAGNOSIS — E118 Type 2 diabetes mellitus with unspecified complications: Secondary | ICD-10-CM

## 2017-07-03 DIAGNOSIS — I10 Essential (primary) hypertension: Secondary | ICD-10-CM | POA: Diagnosis not present

## 2017-07-03 LAB — POCT GLYCOSYLATED HEMOGLOBIN (HGB A1C): HEMOGLOBIN A1C: 5.8

## 2017-07-03 LAB — POCT GLUCOSE (DEVICE FOR HOME USE): GLUCOSE FASTING, POC: 84 mg/dL (ref 70–99)

## 2017-07-03 NOTE — Progress Notes (Signed)
Subjective:  Patient ID: Brianna Mcdonald, female    DOB: [birth year]  Age: 84 y.o. MRN: [id number]  CC: Diabetes   HPI Brianna Mcdonald presents for f/up - she feels well and offers no complaints.  She tells me that her blood pressure and blood sugar have recently been well controlled.  Outpatient Medications Prior to Visit  Medication Sig Dispense Refill  . albuterol (PROAIR HFA) 108 (90 Base) MCG/ACT inhaler Inhale 1 puff into the lungs every 6 (six) hours as needed for wheezing or shortness of breath (or with excersize). 18 g 0  . clotrimazole-betamethasone (LOTRISONE) cream     . Cyanocobalamin (VITAMIN B-12) 1000 MCG SUBL Place 1 tablet under the tongue daily.     Marland Kitchen desloratadine (CLARINEX) 5 MG tablet TAKE 1 TABLET BY MOUTH ONCE DAILY 90 tablet 1  . Dietary Management Product (FOSTEUM PLUS) CAPS Take 1 capsule by mouth 2 (two) times daily. 60 capsule 11  . DYMISTA 137-50 MCG/ACT SUSP     . fish oil-omega-3 fatty acids 1000 MG capsule Take 1 capsule by mouth daily.     . fluticasone (FLONASE) 50 MCG/ACT nasal spray USE TWO SPRAY(S) IN EACH NOSTRIL ONCE DAILY 16 g 11  . lansoprazole (PREVACID) 30 MG capsule TAKE ONE CAPSULE BY MOUTH ONCE DAILY 30 capsule 11  . metoprolol succinate (TOPROL-XL) 25 MG 24 hr tablet TAKE ONE TABLET BY MOUTH ONCE DAILY 30 tablet 5  . selenium 50 MCG TABS tablet Take 50 mcg by mouth daily. Patient states that she is takin g 200 mcg.    . traMADol (ULTRAM) 50 MG tablet Take 1 tablet (50 mg total) by mouth every 12 (twelve) hours as needed. 60 tablet 3  . doxycycline (VIBRA-TABS) 100 MG tablet Take 1 tablet (100 mg total) by mouth 2 (two) times daily. 20 tablet 0  . latanoprost (XALATAN) 0.005 % ophthalmic solution      No facility-administered medications prior to visit.     ROS Review of Systems  Constitutional: Negative.  Negative for appetite change, diaphoresis, fatigue and unexpected weight change.  HENT: Negative.   Eyes: Negative for visual  disturbance.  Respiratory: Negative for cough, chest tightness, shortness of breath and wheezing.   Cardiovascular: Negative for chest pain, palpitations and leg swelling.  Gastrointestinal: Negative for abdominal pain, constipation, diarrhea and nausea.  Endocrine: Negative.  Negative for polydipsia, polyphagia and polyuria.  Genitourinary: Negative.  Negative for difficulty urinating, hematuria and urgency.  Musculoskeletal: Negative.  Negative for arthralgias and myalgias.  Skin: Negative.  Negative for color change and pallor.  Allergic/Immunologic: Negative.   Neurological: Negative.  Negative for dizziness, weakness and light-headedness.  Hematological: Negative for adenopathy. Does not bruise/bleed easily.  Psychiatric/Behavioral: Negative.     Objective:  BP 132/62 (BP Location: Left Arm, Patient Position: Sitting, Cuff Size: Normal)   Pulse (!) 47   Temp 98 F (36.7 C) (Oral)   Resp 16   Ht 5\' 1"  (1.549 m)   Wt 132 lb 4 oz (60 kg)   SpO2 96%   BMI 24.99 kg/m   BP Readings from Last 3 Encounters:  07/03/17 132/62  04/11/17 132/64  02/20/17 (!) 110/58    Wt Readings from Last 3 Encounters:  07/03/17 132 lb 4 oz (60 kg)  04/11/17 133 lb (60.3 kg)  02/20/17 131 lb (59.4 kg)    Physical Exam  Constitutional: She is oriented to person, place, and time. No distress.  HENT:  Mouth/Throat: Oropharynx is clear and  moist. No oropharyngeal exudate.  Eyes: Conjunctivae are normal. Left eye exhibits no discharge. No scleral icterus.  Neck: Normal range of motion. Neck supple. No JVD present. No thyromegaly present.  Cardiovascular: Normal rate, regular rhythm and normal heart sounds. Exam reveals no gallop.  No murmur heard. Pulmonary/Chest: Effort normal and breath sounds normal. No respiratory distress. She has no wheezes. She has no rales.  Abdominal: Soft. Bowel sounds are normal. She exhibits no distension and no mass. There is no tenderness. There is no guarding.    Musculoskeletal: She exhibits edema. She exhibits no tenderness or deformity.  There is trace pitting edema over both ankles.  Lymphadenopathy:    She has no cervical adenopathy.  Neurological: She is alert and oriented to person, place, and time.  Skin: Skin is warm and dry. No rash noted. She is not diaphoretic. No erythema.  Vitals reviewed.   Lab Results  Component Value Date   WBC 5.2 02/21/2016   HGB 12.8 02/21/2016   HCT 37.5 02/21/2016   PLT 188.0 02/21/2016   GLUCOSE 114 (H) 01/03/2017   CHOL 181 02/20/2017   TRIG 63.0 02/20/2017   HDL 60.40 02/20/2017   LDLDIRECT 144.4 10/30/2012   LDLCALC 108 (H) 02/20/2017   ALT 13 02/21/2016   AST 20 02/21/2016   NA 138 01/03/2017   K 4.3 01/03/2017   CL 101 01/03/2017   CREATININE 0.87 01/03/2017   BUN 12 01/03/2017   CO2 31 01/03/2017   TSH 0.80 01/03/2017   HGBA1C 5.8 07/03/2017   MICROALBUR <0.7 02/20/2017    Ct Abdomen Pelvis Wo Contrast  Result Date: 11/09/2015 CLINICAL DATA:  Left upper quadrant abdominal pain. One episode of vomiting last night. EXAM: CT ABDOMEN AND PELVIS WITHOUT CONTRAST TECHNIQUE: Multidetector CT imaging of the abdomen and pelvis was performed following the standard protocol without IV contrast. COMPARISON:  06/10/2007. FINDINGS: Lower chest:  Mild bilateral dependent atelectasis/ scarring. Hepatobiliary: Little change in previously demonstrated liver cysts. Normal appearing gallbladder. Pancreas: No mass or inflammatory process identified on this un-enhanced exam. Spleen: Within normal limits in size and appearance. Adrenals/Urinary Tract: Normal appearing adrenal glands, kidneys, ureters and urinary bladder. No calculi or hydronephrosis. No visible mass. Stomach/Bowel: Mild sigmoid colon diverticulosis. No gastric or small bowel abnormalities. Surgically absent appendix. Vascular/Lymphatic: No pathologically enlarged lymph nodes. No evidence of abdominal aortic aneurysm. Reproductive: Small calcified  uterine fibroid.  No adnexal mass. Other: None. Musculoskeletal: Lumbar and lower thoracic spine degenerative changes. IMPRESSION: No acute abnormality. Electronically Signed   By: Beckie SaltsSteven  Reid M.D.   On: 11/09/2015 09:38   Dg Chest 2 View  Result Date: 11/09/2015 CLINICAL DATA:  84 year old female with a history of left upper quadrant pain EXAM: CHEST  2 VIEW COMPARISON:  04/02/2013, 12/01/2007 FINDINGS: Cardiomediastinal silhouette unchanged in size and contour. No confluent airspace disease, pneumothorax, or pleural effusion. Unremarkable appearance of the upper abdomen. IMPRESSION: No radiographic evidence of acute cardiopulmonary disease, background chronic changes. Aortic atherosclerosis. Signed, Yvone NeuJaime S. Loreta AveWagner, DO [facility] Electronically Signed   By: Gilmer MorJaime  Wagner D.O.   On: 11/09/2015 09:31    Assessment & Plan:   Mora BellmanRuby was seen today for diabetes.  Diagnoses and all orders for this visit:  Type 2 diabetes mellitus with complication, without long-term current use of insulin (HCC)- Her A1c is down to 5.8%.  Her blood sugars are very well controlled.  Medical therapy is not indicated. -     POCT glycosylated hemoglobin (Hb  A1C) -     POCT Glucose (Device for Home Use) -     HM Diabetes Foot Exam  Essential hypertension, benign- Her blood pressure is adequately well controlled.   I have discontinued Khadeeja M. Milke's doxycycline. I am also having her maintain her fish oil-omega-3 fatty acids, Vitamin B-12, selenium, clotrimazole-betamethasone, lansoprazole, albuterol, fluticasone, FOSTEUM PLUS, traMADol, DYMISTA, desloratadine, metoprolol succinate, and latanoprost.  No orders of the defined types were placed in this encounter.    Follow-up: Return in about 6 months (around 01/03/2018).  Sanda Linger, MD

## 2017-07-03 NOTE — Patient Instructions (Signed)

## 2017-07-06 ENCOUNTER — Encounter: Payer: Self-pay | Admitting: Internal Medicine

## 2017-07-06 ENCOUNTER — Ambulatory Visit (INDEPENDENT_AMBULATORY_CARE_PROVIDER_SITE_OTHER): Payer: Medicare Other | Admitting: Internal Medicine

## 2017-07-06 VITALS — BP 122/72 | HR 54 | Temp 98.9°F | Resp 16 | Wt 130.0 lb

## 2017-07-06 DIAGNOSIS — J069 Acute upper respiratory infection, unspecified: Secondary | ICD-10-CM

## 2017-07-06 MED ORDER — BENZONATATE 200 MG PO CAPS: 200.0000 mg | ORAL_CAPSULE | Freq: Three times a day (TID) | ORAL | 0 refills | Status: DC | PRN

## 2017-07-06 MED FILL — BENZONATATE 200 MG CAPS: 200 | 10 days supply | Qty: 30 | Fill #0

## 2017-07-06 NOTE — Assessment & Plan Note (Signed)
Likely viral in nature No need for an antibiotic Tessalon perles otc cold medications - coricidin Allergy medications Rest, fluids  Call if no improvement or any worsening

## 2017-07-06 NOTE — Progress Notes (Signed)
Subjective:    Patient ID: Brianna Mcdonald, female    DOB: 1932-05-03, 84 y.o.   MRN: [id number]  HPI She is here for an acute visit for cold symptoms.  Her symptoms started 2 days ago.   She is experiencing chills, nasal congestion, hoarseness, cough with yellow sputum at times and mild headaches.  She denies fever, ear pain, sinus pain, sore throat, sob, wheeze, nausea and body aches.  She has had hot tea with lemon and honey. She takes her allergy medication daily.    Medications and allergies reviewed with patient and updated if appropriate.  Patient Active Problem List   Diagnosis Date Noted  . Upper respiratory tract infection 04/11/2017  . Dental decay 01/03/2017  . Laryngitis, chronic 01/03/2017  . Routine general medical examination at a health care facility 03/14/2015  . Osteoporosis 02/13/2012  . Cervical radiculitis 01/23/2012  . Left thyroid nodule 07/12/2011  . Type II diabetes mellitus with manifestations (HCC) 04/07/2011  . Hyperlipidemia with target LDL less than 100 12/26/2010  . Sleep apnea 08/23/2010  . Essential hypertension, benign 09/23/2008  . Allergic rhinitis 10/29/2007  . GERD 10/29/2007  . Irritable bowel syndrome 09/27/2007  . HEPATIC CYST 09/27/2007    Current Outpatient Medications on File Prior to Visit  Medication Sig Dispense Refill  . albuterol (PROAIR HFA) 108 (90 Base) MCG/ACT inhaler Inhale 1 puff into the lungs every 6 (six) hours as needed for wheezing or shortness of breath (or with excersize). 18 g 0  . clotrimazole-betamethasone (LOTRISONE) cream     . Cyanocobalamin (VITAMIN B-12) 1000 MCG SUBL Place 1 tablet under the tongue daily.     Marland Kitchen desloratadine (CLARINEX) 5 MG tablet TAKE 1 TABLET BY MOUTH ONCE DAILY 90 tablet 1  . Dietary Management Product (FOSTEUM PLUS) CAPS Take 1 capsule by mouth 2 (two) times daily. 60 capsule 11  . DYMISTA 137-50 MCG/ACT SUSP     . fish oil-omega-3 fatty acids 1000 MG capsule Take 1 capsule by  mouth daily.     . fluticasone (FLONASE) 50 MCG/ACT nasal spray USE TWO SPRAY(S) IN EACH NOSTRIL ONCE DAILY 16 g 11  . lansoprazole (PREVACID) 30 MG capsule TAKE ONE CAPSULE BY MOUTH ONCE DAILY 30 capsule 11  . latanoprost (XALATAN) 0.005 % ophthalmic solution     . metoprolol succinate (TOPROL-XL) 25 MG 24 hr tablet TAKE ONE TABLET BY MOUTH ONCE DAILY 30 tablet 5  . selenium 50 MCG TABS tablet Take 50 mcg by mouth daily. Patient states that she is takin g 200 mcg.    . traMADol (ULTRAM) 50 MG tablet Take 1 tablet (50 mg total) by mouth every 12 (twelve) hours as needed. 60 tablet 3   No current facility-administered medications on file prior to visit.     Past Medical History:  Diagnosis Date  . Allergy   . Bronchitis, not specified as acute or chronic   . Chest pain, unspecified   . Diabetes mellitus   . Edema   . GERD (gastroesophageal reflux disease)   . Hyperlipidemia   . Hypertension   . Irritable bowel syndrome   . Other specified disorders of liver   . Palpitations     Past Surgical History:  Procedure Laterality Date  . APPENDECTOMY    . DILATION AND CURETTAGE OF UTERUS     x 3  . WRIST SURGERY      Social History   Socioeconomic History  . Marital status: Widowed  Spouse name: None  . Number of children: None  . Years of education: None  . Highest education level: None  Social Needs  . Financial resource strain: None  . Food insecurity - worry: None  . Food insecurity - inability: None  . Transportation needs - medical: None  . Transportation needs - non-medical: None  Occupational History  . Occupation: retired  Tobacco Use  . Smoking status: Never Smoker  . Smokeless tobacco: Never Used  Substance and Sexual Activity  . Alcohol use: No  . Drug use: No  . Sexual activity: Not Currently    Birth control/protection: Post-menopausal  Other Topics Concern  . None  Social History Narrative   Adopted 1 daughter   Lives alone   Widowed   retired      Family History  Problem Relation Age of Onset  . Asthma Mother   . Arthritis Mother        rheumatism  . Allergies Sister   . Drug abuse Other   . Allergies Other   . Arthritis Other   . Cancer Brother        tear ducts.     Review of Systems  Constitutional: Positive for chills. Negative for fever.  HENT: Positive for congestion and voice change. Negative for ear pain, sinus pain and sore throat.   Respiratory: Positive for cough (productive of yellow sputum). Negative for shortness of breath and wheezing.   Gastrointestinal: Negative for diarrhea and nausea.  Musculoskeletal: Negative for myalgias.  Neurological: Positive for headaches (mild).       Objective:   Vitals:   07/06/17 1452  BP: 122/72  Pulse: (!) 54  Resp: 16  Temp: 98.9 F (37.2 C)  SpO2: 92%   Filed Weights   07/06/17 1452  Weight: 130 lb (59 kg)   Body mass index is 24.56 kg/m.  Wt Readings from Last 3 Encounters:  07/06/17 130 lb (59 kg)  07/03/17 132 lb 4 oz (60 kg)  04/11/17 133 lb (60.3 kg)     Physical Exam GENERAL APPEARANCE: Appears stated age, well appearing, NAD EYES: conjunctiva clear, no icterus HEENT: bilateral tympanic membranes and ear canals normal, oropharynx with no erythema, no thyromegaly, trachea midline, no cervical or supraclavicular lymphadenopathy LUNGS: Clear to auscultation without wheeze or crackles, unlabored breathing, good air entry bilaterally CARDIOVASCULAR: Normal S1,S2 without murmurs, no edema SKIN: warm, dry        Assessment & Plan:   See Problem List for Assessment and Plan of chronic medical problems.

## 2017-07-06 NOTE — Patient Instructions (Addendum)
Take the cough pills, benzoate, three times a day if needed for cough.  Take coricidin cold products for your cold symptoms.   Continue your allergy medications and nasal sprays.  Use your inhaler as needed.    Upper Respiratory Infection, Adult Most upper respiratory infections (URIs) are a viral infection of the air passages leading to the lungs. A URI affects the nose, throat, and upper air passages. The most common type of URI is nasopharyngitis and is typically referred to as "the common cold." URIs run their course and usually go away on their own. Most of the time, a URI does not require medical attention, but sometimes a bacterial infection in the upper airways can follow a viral infection. This is called a secondary infection. Sinus and middle ear infections are common types of secondary upper respiratory infections. Bacterial pneumonia can also complicate a URI. A URI can worsen asthma and chronic obstructive pulmonary disease (COPD). Sometimes, these complications can require emergency medical care and may be life threatening. What are the causes? Almost all URIs are caused by viruses. A virus is a type of germ and can spread from one person to another. What increases the risk? You may be at risk for a URI if:  You smoke.  You have chronic heart or lung disease.  You have a weakened defense (immune) system.  You are very young or very old.  You have nasal allergies or asthma.  You work in crowded or poorly ventilated areas.  You work in health care facilities or schools.  What are the signs or symptoms? Symptoms typically develop 2-3 days after you come in contact with a cold virus. Most viral URIs last 7-10 days. However, viral URIs from the influenza virus (flu virus) can last 14-18 days and are typically more severe. Symptoms may include:  Runny or stuffy (congested) nose.  Sneezing.  Cough.  Sore throat.  Headache.  Fatigue.  Fever.  Loss of  appetite.  Pain in your forehead, behind your eyes, and over your cheekbones (sinus pain).  Muscle aches.  How is this diagnosed? Your health care provider may diagnose a URI by:  Physical exam.  Tests to check that your symptoms are not due to another condition such as: ? Strep throat. ? Sinusitis. ? Pneumonia. ? Asthma.  How is this treated? A URI goes away on its own with time. It cannot be cured with medicines, but medicines may be prescribed or recommended to relieve symptoms. Medicines may help:  Reduce your fever.  Reduce your cough.  Relieve nasal congestion.  Follow these instructions at home:  Take medicines only as directed by your health care provider.  Gargle warm saltwater or take cough drops to comfort your throat as directed by your health care provider.  Use a warm mist humidifier or inhale steam from a shower to increase air moisture. This may make it easier to breathe.  Drink enough fluid to keep your urine clear or pale yellow.  Eat soups and other clear broths and maintain good nutrition.  Rest as needed.  Return to work when your temperature has returned to normal or as your health care provider advises. You may need to stay home longer to avoid infecting others. You can also use a face mask and careful hand washing to prevent spread of the virus.  Increase the usage of your inhaler if you have asthma.  Do not use any tobacco products, including cigarettes, chewing tobacco, or electronic cigarettes. If you need  help quitting, ask your health care provider. How is this prevented? The best way to protect yourself from getting a cold is to practice good hygiene.  Avoid oral or hand contact with people with cold symptoms.  Wash your hands often if contact occurs.  There is no clear evidence that vitamin C, vitamin E, echinacea, or exercise reduces the chance of developing a cold. However, it is always recommended to get plenty of rest, exercise,  and practice good nutrition. Contact a health care provider if:  You are getting worse rather than better.  Your symptoms are not controlled by medicine.  You have chills.  You have worsening shortness of breath.  You have brown or red mucus.  You have yellow or brown nasal discharge.  You have pain in your face, especially when you bend forward.  You have a fever.  You have swollen neck glands.  You have pain while swallowing.  You have white areas in the back of your throat. Get help right away if:  You have severe or persistent: ? Headache. ? Ear pain. ? Sinus pain. ? Chest pain.  You have chronic lung disease and any of the following: ? Wheezing. ? Prolonged cough. ? Coughing up blood. ? A change in your usual mucus.  You have a stiff neck.  You have changes in your: ? Vision. ? Hearing. ? Thinking. ? Mood. This information is not intended to replace advice given to you by your health care provider. Make sure you discuss any questions you have with your health care provider. Document Released: 10/11/2000 Document Revised: 12/19/2015 Document Reviewed: 07/23/2013 Elsevier Interactive Patient Education  [employer]2018 [employer]

## 2017-08-21 ENCOUNTER — Ambulatory Visit: Payer: 59 | Admitting: Internal Medicine

## 2017-08-29 ENCOUNTER — Other Ambulatory Visit: Payer: Self-pay | Admitting: Internal Medicine

## 2017-09-12 DIAGNOSIS — H2513 Age-related nuclear cataract, bilateral: Secondary | ICD-10-CM | POA: Diagnosis not present

## 2017-09-12 DIAGNOSIS — H52203 Unspecified astigmatism, bilateral: Secondary | ICD-10-CM | POA: Diagnosis not present

## 2017-09-12 DIAGNOSIS — H5203 Hypermetropia, bilateral: Secondary | ICD-10-CM | POA: Diagnosis not present

## 2017-09-12 DIAGNOSIS — H401131 Primary open-angle glaucoma, bilateral, mild stage: Secondary | ICD-10-CM | POA: Diagnosis not present

## 2017-09-12 DIAGNOSIS — H524 Presbyopia: Secondary | ICD-10-CM | POA: Diagnosis not present

## 2017-10-02 ENCOUNTER — Other Ambulatory Visit: Payer: Self-pay | Admitting: Internal Medicine

## 2017-10-02 DIAGNOSIS — J301 Allergic rhinitis due to pollen: Secondary | ICD-10-CM

## 2017-10-10 ENCOUNTER — Telehealth: Payer: Self-pay | Admitting: Internal Medicine

## 2017-10-10 NOTE — Telephone Encounter (Signed)
Contacted pt and informed that we will order labs the day of her appointment.

## 2017-10-10 NOTE — Telephone Encounter (Signed)
Brianna Mcdonald would like to know if she needs to come in sooner to get labs done before her September appt.  Brianna Mcdonald states she feels like she should have something done before.  Please follow up with Brianna Mcdonald in regard.

## 2017-10-29 ENCOUNTER — Other Ambulatory Visit: Payer: Self-pay | Admitting: Internal Medicine

## 2017-12-19 DIAGNOSIS — H401131 Primary open-angle glaucoma, bilateral, mild stage: Secondary | ICD-10-CM | POA: Diagnosis not present

## 2018-01-03 ENCOUNTER — Ambulatory Visit: Payer: 59 | Admitting: Internal Medicine

## 2018-01-03 ENCOUNTER — Ambulatory Visit: Payer: 59

## 2018-01-04 NOTE — Progress Notes (Addendum)
Subjective:   Brianna Mcdonald is a 84 y.o. female who presents for Medicare Annual (Subsequent) preventive examination.  Review of Systems:  No ROS.  Medicare Wellness Visit. Additional risk factors are reflected in the social history.  Cardiac Risk Factors include: advanced age (>88men, >35 women);dyslipidemia;hypertension;diabetes mellitus Sleep patterns: gets up 1 times nightly to void and sleeps 6 hours nightly.    Home Safety/Smoke Alarms: Feels safe in home. Smoke alarms in place.  Living environment; residence and Firearm Safety: 1-story house/ trailer, no firearms.Daughter lives with her.  no needs for DME, limited support system Seat Belt Safety/Bike Helmet: Wears seat belt.      Objective:     Vitals: BP 134/72   Pulse (!) 56   Temp 98.3 F (36.8 C)   Resp 18   Ht 5\' 1"  (1.549 m)   Wt 133 lb (60.3 kg)   SpO2 95%   BMI 25.13 kg/m   Body mass index is 25.13 kg/m.  Advanced Directives 01/07/2018 05/26/2016 05/25/2016 11/09/2015 03/14/2015  Does Patient Have a Medical Advance Directive? No Yes No No Yes  Type of Advance Directive - Healthcare Power of [place];Living will - - Living will;Healthcare Power of Attorney  Does patient want to make changes to medical advance directive? - Yes (MAU/Ambulatory/Procedural Areas - Information given) - - No - Patient declined  Copy of Healthcare Power of Attorney in Chart? - No - copy requested - - Yes  Would patient like information on creating a medical advance directive? Yes (ED - Information included in AVS) - No - Patient declined No - patient declined information -    Tobacco Social History   Tobacco Use  Smoking Status Never Smoker  Smokeless Tobacco Never Used     Counseling given: Not Answered  Past Medical History:  Diagnosis Date  . Allergy   . Bronchitis, not specified as acute or chronic   . Chest pain, unspecified   . Diabetes mellitus   . Edema   . GERD (gastroesophageal reflux disease)   .  Hyperlipidemia   . Hypertension   . Irritable bowel syndrome   . Other specified disorders of liver   . Palpitations    Past Surgical History:  Procedure Laterality Date  . APPENDECTOMY    . DILATION AND CURETTAGE OF UTERUS     x 3  . WRIST SURGERY     Family History  Problem Relation Age of Onset  . Asthma Mother   . Arthritis Mother        rheumatism  . Allergies Sister   . Drug abuse Other   . Allergies Other   . Arthritis Other   . Cancer Brother        tear ducts.    Social History   Socioeconomic History  . Marital status: Widowed    Spouse name: Not on file  . Number of children: 1  . Years of education: Not on file  . Highest education level: Not on file  Occupational History  . Occupation: retired  Engineer, production  . Financial resource strain: Not hard at all  . Food insecurity:    Worry: Never true    Inability: Never true  . Transportation needs:    Medical: No    Non-medical: No  Tobacco Use  . Smoking status: Never Smoker  . Smokeless tobacco: Never Used  Substance and Sexual Activity  . Alcohol use: No  . Drug use: No  . Sexual activity: Not Currently  Birth control/protection: Post-menopausal  Lifestyle  . Physical activity:    Days per week: 0 days    Minutes per session: 0 min  . Stress: To some extent  Relationships  . Social connections:    Talks on phone: More than three times a week    Gets together: More than three times a week    Attends religious service: 1 to 4 times per year    Active member of club or organization: Yes    Attends meetings of clubs or organizations: More than 4 times per year    Relationship status: Widowed  Other Topics Concern  . Not on file  Social History Narrative   Adopted 1 daughter   Widowed   retired    Outpatient Encounter Medications as of 01/07/2018  Medication Sig  . acetaminophen (TYLENOL) 325 MG tablet Take 650 mg by mouth every 6 (six) hours as needed.  Marland Kitchen albuterol (PROAIR HFA) 108 (90  Base) MCG/ACT inhaler Inhale 1 puff into the lungs every 6 (six) hours as needed for wheezing or shortness of breath (or with excersize).  . Cyanocobalamin (VITAMIN B-12) 1000 MCG SUBL Place 1 tablet under the tongue daily.   Marland Kitchen desloratadine (CLARINEX) 5 MG tablet TAKE 1 TABLET BY MOUTH ONCE DAILY  . fish oil-omega-3 fatty acids 1000 MG capsule Take 1 capsule by mouth daily.   . fluticasone (FLONASE) 50 MCG/ACT nasal spray USE 2 SPRAY(S) IN EACH NOSTRIL ONCE DAILY  . lansoprazole (PREVACID) 30 MG capsule TAKE 1 CAPSULE BY MOUTH ONCE DAILY  . latanoprost (XALATAN) 0.005 % ophthalmic solution   . metoprolol succinate (TOPROL-XL) 25 MG 24 hr tablet TAKE 1 TABLET BY MOUTH ONCE DAILY  . selenium 50 MCG TABS tablet Take 200 mcg by mouth daily. Patient states that she is takin g 200 mcg.  . [DISCONTINUED] benzonatate (TESSALON) 200 MG capsule Take 1 capsule (200 mg total) by mouth 3 (three) times daily as needed for cough. (Patient not taking: Reported on 01/07/2018)  . [DISCONTINUED] clotrimazole-betamethasone (LOTRISONE) cream   . [DISCONTINUED] Dietary Management Product (FOSTEUM PLUS) CAPS Take 1 capsule by mouth 2 (two) times daily. (Patient not taking: Reported on 01/07/2018)  . [DISCONTINUED] traMADol (ULTRAM) 50 MG tablet Take 1 tablet (50 mg total) by mouth every 12 (twelve) hours as needed. (Patient not taking: Reported on 01/07/2018)   No facility-administered encounter medications on file as of 01/07/2018.     Activities of Daily Living In your present state of health, do you have any difficulty performing the following activities: 01/07/2018  Hearing? N  Vision? N  Difficulty concentrating or making decisions? N  Walking or climbing stairs? N  Dressing or bathing? N  Doing errands, shopping? N  Preparing Food and eating ? N  Using the Toilet? N  In the past six months, have you accidently leaked urine? N  Do you have problems with loss of bowel control? N  Managing your Medications? N    Managing your Finances? N  Housekeeping or managing your Housekeeping? N  Some recent data might be hidden    Patient Care Team: Etta Grandchild, MD as PCP - General (Internal Medicine) Felecia Shelling, DPM as Consulting Physician (Podiatry) Virgina Organ, MD (Ophthalmology)    Assessment:   This is a routine wellness examination for Sheridyn. Physical assessment deferred to PCP.   Exercise Activities and Dietary recommendations Current Exercise Habits: The patient does not participate in regular exercise at present, Exercise limited by: orthopedic condition(s)  Diet (meal preparation, eat out, water intake, caffeinated beverages, dairy products, fruits and vegetables): in general, a "healthy" diet     Reviewed heart healthy and diabetic diet, Encouraged patient to increase daily water and healthy fluid intake.      Goals      Patient Stated   . patient states (pt-stated)     "move to [place] or [place]", states for better health care.       Other   . Patient Stated     Stay as healthy and as independent as possible.       Fall Risk Fall Risk  01/07/2018 07/03/2017 05/25/2016 04/07/2016 03/14/2015  Falls in the past year? No No No Yes No  Comment - - - Emmi Telephone Survey: data to providers prior to load -  Number falls in past yr: - - - 1 -  Comment - - - Emmi Telephone Survey Actual Response = 1 -  Injury with Fall? - - - No -  Risk for fall due to : Impaired balance/gait - - - -    Depression Screen PHQ 2/9 Scores 01/07/2018 07/03/2017 05/25/2016 03/14/2015  PHQ - 2 Score 1 0 0 0  PHQ- 9 Score 3 - - -     Cognitive Function MMSE - Mini Mental State Exam 01/07/2018  Orientation to time 5  Orientation to Place 5  Registration 3  Attention/ Calculation 3  Recall 2  Language- name 2 objects 2  Language- repeat 1  Language- follow 3 step command 3  Language- read & follow direction 1  Write a sentence 1  Copy design 1  Total score 27        Immunization History   Administered Date(s) Administered  . Influenza Split 01/23/2012  . Influenza Whole 02/05/2011  . Influenza, High Dose Seasonal PF 02/21/2016, 02/20/2017  . Influenza,inj,Quad PF,6+ Mos 01/17/2013, 01/20/2014, 03/09/2015  . Pneumococcal Conjugate-13 03/04/2013  . Pneumococcal Polysaccharide-23 12/16/2009, 08/24/2015  . Tdap 03/04/2013   Screening Tests Health Maintenance  Topic Date Due  . OPHTHALMOLOGY EXAM  09/06/2017  . INFLUENZA VACCINE  11/29/2017  . HEMOGLOBIN A1C  01/03/2018  . URINE MICROALBUMIN  02/20/2018  . FOOT EXAM  07/04/2018  . TETANUS/TDAP  03/05/2023  . DEXA SCAN  Completed  . PNA vac Low Risk Adult  Completed       Plan:     Continue doing brain stimulating activities (puzzles, reading, adult coloring books, staying active) to keep memory sharp.   Continue to eat heart healthy diet (full of fruits, vegetables, whole grains, lean protein, water--limit salt, fat, and sugar intake) and increase physical activity as tolerated.  I have personally reviewed and noted the following in the patient's chart:   . Medical and social history . Use of alcohol, tobacco or illicit drugs  . Current medications and supplements . Functional ability and status . Nutritional status . Physical activity . Advanced directives . List of other physicians . Vitals . Screenings to include cognitive, depression, and falls . Referrals and appointments  In addition, I have reviewed and discussed with patient certain preventive protocols, quality metrics, and best practice recommendations. A written personalized care plan for preventive services as well as general preventive health recommendations were provided to patient.     Wanda Plump, RN  01/07/2018   Medical screening examination/treatment/procedure(s) were performed by non-physician practitioner and as supervising physician I was immediately available for consultation/collaboration. I agree with above. Sanda Linger, MD

## 2018-01-07 ENCOUNTER — Other Ambulatory Visit (INDEPENDENT_AMBULATORY_CARE_PROVIDER_SITE_OTHER): Payer: Medicare Other

## 2018-01-07 ENCOUNTER — Encounter: Payer: Self-pay | Admitting: Internal Medicine

## 2018-01-07 ENCOUNTER — Ambulatory Visit (INDEPENDENT_AMBULATORY_CARE_PROVIDER_SITE_OTHER): Payer: Medicare Other | Admitting: Internal Medicine

## 2018-01-07 ENCOUNTER — Ambulatory Visit (INDEPENDENT_AMBULATORY_CARE_PROVIDER_SITE_OTHER): Payer: Medicare Other | Admitting: *Deleted

## 2018-01-07 ENCOUNTER — Telehealth: Payer: Self-pay | Admitting: Internal Medicine

## 2018-01-07 VITALS — BP 134/72 | HR 56 | Temp 98.3°F | Resp 18 | Ht 61.0 in | Wt 133.0 lb

## 2018-01-07 VITALS — BP 134/72 | HR 54 | Temp 98.3°F | Resp 16 | Ht 61.0 in | Wt 133.0 lb

## 2018-01-07 DIAGNOSIS — M150 Primary generalized (osteo)arthritis: Secondary | ICD-10-CM | POA: Diagnosis not present

## 2018-01-07 DIAGNOSIS — K7689 Other specified diseases of liver: Secondary | ICD-10-CM

## 2018-01-07 DIAGNOSIS — R7303 Prediabetes: Secondary | ICD-10-CM

## 2018-01-07 DIAGNOSIS — E785 Hyperlipidemia, unspecified: Secondary | ICD-10-CM | POA: Diagnosis not present

## 2018-01-07 DIAGNOSIS — E041 Nontoxic single thyroid nodule: Secondary | ICD-10-CM

## 2018-01-07 DIAGNOSIS — Z0000 Encounter for general adult medical examination without abnormal findings: Secondary | ICD-10-CM | POA: Diagnosis not present

## 2018-01-07 DIAGNOSIS — M159 Polyosteoarthritis, unspecified: Secondary | ICD-10-CM

## 2018-01-07 DIAGNOSIS — I10 Essential (primary) hypertension: Secondary | ICD-10-CM | POA: Diagnosis not present

## 2018-01-07 LAB — LIPID PANEL
Cholesterol: 211 mg/dL — ABNORMAL HIGH (ref 0–200)
HDL: 72.5 mg/dL (ref >39.00)
LDL CALC: 118 mg/dL — AB (ref 0–99)
NONHDL: 138.13
Total CHOL/HDL Ratio: 3
Triglycerides: 101 mg/dL (ref 0.0–149.0)
VLDL: 20.2 mg/dL (ref 0.0–40.0)

## 2018-01-07 LAB — CBC WITH DIFFERENTIAL/PLATELET
BASOS PCT: 0.4 % (ref 0.0–3.0)
Basophils Absolute: 0 10*3/uL (ref 0.0–0.1)
EOS PCT: 3.5 % (ref 0.0–5.0)
Eosinophils Absolute: 0.2 10*3/uL (ref 0.0–0.7)
HCT: 40.1 % (ref 36.0–46.0)
HEMOGLOBIN: 13.6 g/dL (ref 12.0–15.0)
LYMPHS ABS: 2.3 10*3/uL (ref 0.7–4.0)
Lymphocytes Relative: 44.7 % (ref 12.0–46.0)
MCHC: 33.9 g/dL (ref 30.0–36.0)
MCV: 96.2 fl (ref 78.0–100.0)
Monocytes Absolute: 0.6 10*3/uL (ref 0.1–1.0)
Monocytes Relative: 11.9 % (ref 3.0–12.0)
NEUTROS ABS: 2.1 10*3/uL (ref 1.4–7.7)
NEUTROS PCT: 39.5 % — AB (ref 43.0–77.0)
PLATELETS: 180 10*3/uL (ref 150.0–400.0)
RBC: 4.17 Mil/uL (ref 3.87–5.11)
RDW: 12.9 % (ref 11.5–15.5)
WBC: 5.2 10*3/uL (ref 4.0–10.5)

## 2018-01-07 LAB — COMPREHENSIVE METABOLIC PANEL
ALT: 14 U/L (ref 0–35)
AST: 18 U/L (ref 0–37)
Albumin: 4.1 g/dL (ref 3.5–5.2)
Alkaline Phosphatase: 56 U/L (ref 39–117)
BUN: 20 mg/dL (ref 6–23)
CHLORIDE: 103 meq/L (ref 96–112)
CO2: 32 meq/L (ref 19–32)
Calcium: 9.9 mg/dL (ref 8.4–10.5)
Creatinine, Ser: 0.87 mg/dL (ref 0.40–1.20)
GFR: 79.91 mL/min (ref >60.00)
GLUCOSE: 88 mg/dL (ref 70–99)
POTASSIUM: 4.7 meq/L (ref 3.5–5.1)
SODIUM: 139 meq/L (ref 135–145)
Total Bilirubin: 0.5 mg/dL (ref 0.2–1.2)
Total Protein: 7.8 g/dL (ref 6.0–8.3)

## 2018-01-07 LAB — HEMOGLOBIN A1C: Hgb A1c MFr Bld: 5.8 % (ref 4.6–6.5)

## 2018-01-07 LAB — TSH: TSH: 1.23 u[IU]/mL (ref 0.35–4.50)

## 2018-01-07 MED ORDER — TRAMADOL HCL 50 MG PO TABS: 50.0000 mg | ORAL_TABLET | Freq: Two times a day (BID) | ORAL | 1 refills | Status: DC | PRN

## 2018-01-07 NOTE — Patient Instructions (Signed)

## 2018-01-07 NOTE — Telephone Encounter (Signed)
Copied from CRM [id number]. Topic: General - Other >> Jan 07, 2018  4:40 PM Darletta Moll L wrote: Reason for CRM: Patient needs a call back to discuss labs and would like bone density, pneumonia vaccine, and tetanus. She says her after visit summary says she's supposed to have a urinalysis but I dont see that.

## 2018-01-07 NOTE — Patient Instructions (Addendum)
Continue doing brain stimulating activities (puzzles, reading, adult coloring books, staying active) to keep memory sharp.   Continue to eat heart healthy diet (full of fruits, vegetables, whole grains, lean protein, water--limit salt, fat, and sugar intake) and increase physical activity as tolerated.   Brianna Mcdonald , Thank you for taking time to come for your Medicare Wellness Visit. I appreciate your ongoing commitment to your health goals. Please review the following plan we discussed and let me know if I can assist you in the future.   These are the goals we discussed: Goals      Patient Stated   . patient states (pt-stated)     "move to [place] or [place]", states for better health care.       Other   . Patient Stated     Stay as healthy and as independent as possible.       This is a list of the screening recommended for you and due dates:  Health Maintenance  Topic Date Due  . Eye exam for diabetics  09/06/2017  . Flu Shot  11/29/2017  . Hemoglobin A1C  01/03/2018  . Urine Protein Check  02/20/2018  . Complete foot exam   07/04/2018  . Tetanus Vaccine  03/05/2023  . DEXA scan (bone density measurement)  Completed  . Pneumonia vaccines  Completed   Health Maintenance, Female Adopting a healthy lifestyle and getting preventive care can go a long way to promote health and wellness. Talk with your health care provider about what schedule of regular examinations is right for you. This is a good chance for you to check in with your provider about disease prevention and staying healthy. In between checkups, there are plenty of things you can do on your own. Experts have done a lot of research about which lifestyle changes and preventive measures are most likely to keep you healthy. Ask your health care provider for more information. Weight and diet Eat a healthy diet  Be sure to include plenty of vegetables, fruits, low-fat dairy products, and lean protein.  Do not eat a lot  of foods high in solid fats, added sugars, or salt.  Get regular exercise. This is one of the most important things you can do for your health. ? Most adults should exercise for at least 150 minutes each week. The exercise should increase your heart rate and make you sweat (moderate-intensity exercise). ? Most adults should also do strengthening exercises at least twice a week. This is in addition to the moderate-intensity exercise.  Maintain a healthy weight  Body mass index (BMI) is a measurement that can be used to identify possible weight problems. It estimates body fat based on height and weight. Your health care provider can help determine your BMI and help you achieve or maintain a healthy weight.  For females 73 years of age and older: ? A BMI below 18.5 is considered underweight. ? A BMI of 18.5 to 24.9 is normal. ? A BMI of 25 to 29.9 is considered overweight. ? A BMI of 30 and above is considered obese.  Watch levels of cholesterol and blood lipids  You should start having your blood tested for lipids and cholesterol at 82 years of age, then have this test every 5 years.  You may need to have your cholesterol levels checked more often if: ? Your lipid or cholesterol levels are high. ? You are older than 82 years of age. ? You are at high risk for heart  disease.  Cancer screening Lung Cancer  Lung cancer screening is recommended for adults 46-25 years old who are at high risk for lung cancer because of a history of smoking.  A yearly low-dose CT scan of the lungs is recommended for people who: ? Currently smoke. ? Have quit within the past 15 years. ? Have at least a 30-pack-year history of smoking. A pack year is smoking an average of one pack of cigarettes a day for 1 year.  Yearly screening should continue until it has been 15 years since you quit.  Yearly screening should stop if you develop a health problem that would prevent you from having lung cancer  treatment.  Breast Cancer  Practice breast self-awareness. This means understanding how your breasts normally appear and feel.  It also means doing regular breast self-exams. Let your health care provider know about any changes, no matter how small.  If you are in your 20s or 30s, you should have a clinical breast exam (CBE) by a health care provider every 1-3 years as part of a regular health exam.  If you are 70 or older, have a CBE every year. Also consider having a breast X-ray (mammogram) every year.  If you have a family history of breast cancer, talk to your health care provider about genetic screening.  If you are at high risk for breast cancer, talk to your health care provider about having an MRI and a mammogram every year.  Breast cancer gene (BRCA) assessment is recommended for women who have family members with BRCA-related cancers. BRCA-related cancers include: ? Breast. ? Ovarian. ? Tubal. ? Peritoneal cancers.  Results of the assessment will determine the need for genetic counseling and BRCA1 and BRCA2 testing.  Cervical Cancer Your health care provider may recommend that you be screened regularly for cancer of the pelvic organs (ovaries, uterus, and vagina). This screening involves a pelvic examination, including checking for microscopic changes to the surface of your cervix (Pap test). You may be encouraged to have this screening done every 3 years, beginning at age 74.  For women ages 73-65, health care providers may recommend pelvic exams and Pap testing every 3 years, or they may recommend the Pap and pelvic exam, combined with testing for human papilloma virus (HPV), every 5 years. Some types of HPV increase your risk of cervical cancer. Testing for HPV may also be done on women of any age with unclear Pap test results.  Other health care providers may not recommend any screening for nonpregnant women who are considered low risk for pelvic cancer and who do not have  symptoms. Ask your health care provider if a screening pelvic exam is right for you.  If you have had past treatment for cervical cancer or a condition that could lead to cancer, you need Pap tests and screening for cancer for at least 20 years after your treatment. If Pap tests have been discontinued, your risk factors (such as having a new sexual partner) need to be reassessed to determine if screening should resume. Some women have medical problems that increase the chance of getting cervical cancer. In these cases, your health care provider may recommend more frequent screening and Pap tests.  Colorectal Cancer  This type of cancer can be detected and often prevented.  Routine colorectal cancer screening usually begins at 82 years of age and continues through 82 years of age.  Your health care provider may recommend screening at an earlier age if you have  risk factors for colon cancer.  Your health care provider may also recommend using home test kits to check for hidden blood in the stool.  A small camera at the end of a tube can be used to examine your colon directly (sigmoidoscopy or colonoscopy). This is done to check for the earliest forms of colorectal cancer.  Routine screening usually begins at age 62.  Direct examination of the colon should be repeated every 5-10 years through 82 years of age. However, you may need to be screened more often if early forms of precancerous polyps or small growths are found.  Skin Cancer  Check your skin from head to toe regularly.  Tell your health care provider about any new moles or changes in moles, especially if there is a change in a mole's shape or color.  Also tell your health care provider if you have a mole that is larger than the size of a pencil eraser.  Always use sunscreen. Apply sunscreen liberally and repeatedly throughout the day.  Protect yourself by wearing long sleeves, pants, a wide-brimmed hat, and sunglasses whenever you  are outside.  Heart disease, diabetes, and high blood pressure  High blood pressure causes heart disease and increases the risk of stroke. High blood pressure is more likely to develop in: ? People who have blood pressure in the high end of the normal range (130-139/85-89 mm Hg). ? People who are overweight or obese. ? People who are African American.  If you are 12-66 years of age, have your blood pressure checked every 3-5 years. If you are 68 years of age or older, have your blood pressure checked every year. You should have your blood pressure measured twice-once when you are at a hospital or clinic, and once when you are not at a hospital or clinic. Record the average of the two measurements. To check your blood pressure when you are not at a hospital or clinic, you can use: ? An automated blood pressure machine at a pharmacy. ? A home blood pressure monitor.  If you are between 52 years and 26 years old, ask your health care provider if you should take aspirin to prevent strokes.  Have regular diabetes screenings. This involves taking a blood sample to check your fasting blood sugar level. ? If you are at a normal weight and have a low risk for diabetes, have this test once every three years after 82 years of age. ? If you are overweight and have a high risk for diabetes, consider being tested at a younger age or more often. Preventing infection Hepatitis B  If you have a higher risk for hepatitis B, you should be screened for this virus. You are considered at high risk for hepatitis B if: ? You were born in a country where hepatitis B is common. Ask your health care provider which countries are considered high risk. ? Your parents were born in a high-risk country, and you have not been immunized against hepatitis B (hepatitis B vaccine). ? You have HIV or AIDS. ? You use needles to inject street drugs. ? You live with someone who has hepatitis B. ? You have had sex with someone who  has hepatitis B. ? You get hemodialysis treatment. ? You take certain medicines for conditions, including cancer, organ transplantation, and autoimmune conditions.  Hepatitis C  Blood testing is recommended for: ? Everyone born from [birth year] through 1963. ? Anyone with known risk factors for hepatitis C.  Sexually transmitted infections (  STIs)  You should be screened for sexually transmitted infections (STIs) including gonorrhea and chlamydia if: ? You are sexually active and are younger than 82 years of age. ? You are older than 82 years of age and your health care provider tells you that you are at risk for this type of infection. ? Your sexual activity has changed since you were last screened and you are at an increased risk for chlamydia or gonorrhea. Ask your health care provider if you are at risk.  If you do not have HIV, but are at risk, it may be recommended that you take a prescription medicine daily to prevent HIV infection. This is called pre-exposure prophylaxis (PrEP). You are considered at risk if: ? You are sexually active and do not regularly use condoms or know the HIV status of your partner(s). ? You take drugs by injection. ? You are sexually active with a partner who has HIV.  Talk with your health care provider about whether you are at high risk of being infected with HIV. If you choose to begin PrEP, you should first be tested for HIV. You should then be tested every 3 months for as long as you are taking PrEP. Pregnancy  If you are premenopausal and you may become pregnant, ask your health care provider about preconception counseling.  If you may become pregnant, take 400 to 800 micrograms (mcg) of folic acid every day.  If you want to prevent pregnancy, talk to your health care provider about birth control (contraception). Osteoporosis and menopause  Osteoporosis is a disease in which the bones lose minerals and strength with aging. This can result in serious bone  fractures. Your risk for osteoporosis can be identified using a bone density scan.  If you are 47 years of age or older, or if you are at risk for osteoporosis and fractures, ask your health care provider if you should be screened.  Ask your health care provider whether you should take a calcium or vitamin D supplement to lower your risk for osteoporosis.  Menopause may have certain physical symptoms and risks.  Hormone replacement therapy may reduce some of these symptoms and risks. Talk to your health care provider about whether hormone replacement therapy is right for you. Follow these instructions at home:  Schedule regular health, dental, and eye exams.  Stay current with your immunizations.  Do not use any tobacco products including cigarettes, chewing tobacco, or electronic cigarettes.  If you are pregnant, do not drink alcohol.  If you are breastfeeding, limit how much and how often you drink alcohol.  Limit alcohol intake to no more than 1 drink per day for nonpregnant women. One drink equals 12 ounces of beer, 5 ounces of Evelene Roussin, or 1 ounces of hard liquor.  Do not use street drugs.  Do not share needles.  Ask your health care provider for help if you need support or information about quitting drugs.  Tell your health care provider if you often feel depressed.  Tell your health care provider if you have ever been abused or do not feel safe at home. This information is not intended to replace advice given to you by your health care provider. Make sure you discuss any questions you have with your health care provider. Document Released: 10/31/2010 Document Revised: 09/23/2015 Document Reviewed: 01/19/2015 Elsevier Interactive Patient Education  [employer].  It is important to avoid accidents which may result in broken bones.  Here are a few ideas on  how to make your home safer so you will be less likely to trip or fall.  1. Use nonskid mats or non slip strips in  your shower or tub, on your bathroom floor and around sinks.  If you know that you have spilled water, wipe it up! 2. In the bathroom, it is important to have properly installed grab bars on the walls or on the edge of the tub.  Towel racks are NOT strong enough for you to hold onto or to pull on for support. 3. Stairs and hallways should have enough light.  Add lamps or night lights if you need ore light. 4. It is good to have handrails on both sides of the stairs if possible.  Always fix broken handrails right away. 5. It is important to see the edges of steps.  Paint the edges of outdoor steps white so you can see them better.  Put colored tape on the edge of inside steps. 6. Throw-rugs are dangerous because they can slide.  Removing the rugs is the best idea, but if they must stay, add adhesive carpet tape to prevent slipping. 7. Do not keep things on stairs or in the halls.  Remove small furniture that blocks the halls as it may cause you to trip.  Keep telephone and electrical cords out of the way where you walk. 8. Always were sturdy, rubber-soled shoes for good support.  Never wear just socks, especially on the stairs.  Socks may cause you to slip or fall.  Do not wear full-length housecoats as you can easily trip on the bottom.  9. Place the things you use the most on the shelves that are the easiest to reach.  If you use a stepstool, make sure it is in good condition.  If you feel unsteady, DO NOT climb, ask for help. 10. If a health professional advises you to use a cane or walker, do not be ashamed.  These items can keep you from falling and breaking your bones.

## 2018-01-07 NOTE — Progress Notes (Signed)
Subjective:  Patient ID: Brianna Mcdonald, female    DOB: 06/08/1932  Age: 84 y.o. MRN: [id number]  CC: Hypertension; Osteoarthritis; and Hyperlipidemia   HPI OTTIE NEGLIA presents for f/up - She complains of chronic, bilateral, worsening knee pain with no history of swelling or injury. This was previously treat with tramadol, she requests a refill.  Outpatient Medications Prior to Visit  Medication Sig Dispense Refill  . acetaminophen (TYLENOL) 325 MG tablet Take 650 mg by mouth every 6 (six) hours as needed.    Marland Kitchen albuterol (PROAIR HFA) 108 (90 Base) MCG/ACT inhaler Inhale 1 puff into the lungs every 6 (six) hours as needed for wheezing or shortness of breath (or with excersize). 18 g 0  . Cyanocobalamin (VITAMIN B-12) 1000 MCG SUBL Place 1 tablet under the tongue daily.     Marland Kitchen desloratadine (CLARINEX) 5 MG tablet TAKE 1 TABLET BY MOUTH ONCE DAILY 90 tablet 1  . fish oil-omega-3 fatty acids 1000 MG capsule Take 1 capsule by mouth daily.     . fluticasone (FLONASE) 50 MCG/ACT nasal spray USE 2 SPRAY(S) IN EACH NOSTRIL ONCE DAILY 48 g 1  . lansoprazole (PREVACID) 30 MG capsule TAKE 1 CAPSULE BY MOUTH ONCE DAILY 30 capsule 11  . latanoprost (XALATAN) 0.005 % ophthalmic solution     . metoprolol succinate (TOPROL-XL) 25 MG 24 hr tablet TAKE 1 TABLET BY MOUTH ONCE DAILY 30 tablet 5  . selenium 50 MCG TABS tablet Take 200 mcg by mouth daily. Patient states that she is takin g 200 mcg.     No facility-administered medications prior to visit.     ROS Review of Systems  Constitutional: Negative for diaphoresis, fatigue and fever.  HENT: Negative.   Eyes: Negative for visual disturbance.  Respiratory: Negative for cough, chest tightness, shortness of breath and wheezing.   Cardiovascular: Negative for chest pain, palpitations and leg swelling.  Gastrointestinal: Negative for abdominal pain, constipation, diarrhea and vomiting.  Endocrine: Negative for cold intolerance and heat  intolerance.  Genitourinary: Negative.  Negative for difficulty urinating.  Musculoskeletal: Positive for arthralgias. Negative for back pain and neck pain.  Skin: Negative.  Negative for color change and pallor.  Neurological: Negative.  Negative for dizziness, weakness and light-headedness.  Hematological: Negative for adenopathy. Does not bruise/bleed easily.  Psychiatric/Behavioral: Negative.     Objective:  BP 134/72 (BP Location: Left Arm, Patient Position: Sitting, Cuff Size: Normal)   Pulse (!) 54   Temp 98.3 F (36.8 C) (Oral)   Resp 16   Ht 5\' 1"  (1.549 m)   Wt 133 lb (60.3 kg)   SpO2 95%   BMI 25.13 kg/m   BP Readings from Last 3 Encounters:  01/07/18 134/72  01/07/18 134/72  07/06/17 122/72    Wt Readings from Last 3 Encounters:  01/07/18 133 lb (60.3 kg)  01/07/18 133 lb (60.3 kg)  07/06/17 130 lb (59 kg)    Physical Exam  Constitutional: She is oriented to person, place, and time. No distress.  HENT:  Mouth/Throat: Oropharynx is clear and moist. No oropharyngeal exudate.  Eyes: Conjunctivae are normal. No scleral icterus.  Neck: Normal range of motion. Neck supple. No JVD present. No thyromegaly present.  Cardiovascular: Normal rate and regular rhythm.  No murmur heard. Pulmonary/Chest: Effort normal and breath sounds normal. No respiratory distress. She has no wheezes. She has no rales.  Abdominal: Soft. Bowel sounds are normal. She exhibits no mass. There is no hepatosplenomegaly. There is no tenderness.  Musculoskeletal: Normal range of motion. She exhibits no edema, tenderness or deformity.  Neurological: She is alert and oriented to person, place, and time.  Skin: Skin is warm and dry. She is not diaphoretic. No pallor.  Psychiatric: She has a normal mood and affect. Her behavior is normal. Judgment and thought content normal.  Vitals reviewed.   Lab Results  Component Value Date   WBC 5.2 01/07/2018   HGB 13.6 01/07/2018   HCT 40.1 01/07/2018     PLT 180.0 01/07/2018   GLUCOSE 88 01/07/2018   CHOL 211 (H) 01/07/2018   TRIG 101.0 01/07/2018   HDL 72.50 01/07/2018   LDLDIRECT 144.4 10/30/2012   LDLCALC 118 (H) 01/07/2018   ALT 14 01/07/2018   AST 18 01/07/2018   NA 139 01/07/2018   K 4.7 01/07/2018   CL 103 01/07/2018   CREATININE 0.87 01/07/2018   BUN 20 01/07/2018   CO2 32 01/07/2018   TSH 1.23 01/07/2018   HGBA1C 5.8 01/07/2018   MICROALBUR <0.7 02/20/2017    Ct Abdomen Pelvis Wo Contrast  Result Date: 11/09/2015 CLINICAL DATA:  Left upper quadrant abdominal pain. One episode of vomiting last night. EXAM: CT ABDOMEN AND PELVIS WITHOUT CONTRAST TECHNIQUE: Multidetector CT imaging of the abdomen and pelvis was performed following the standard protocol without IV contrast. COMPARISON:  06/10/2007. FINDINGS: Lower chest:  Mild bilateral dependent atelectasis/ scarring. Hepatobiliary: Little change in previously demonstrated liver cysts. Normal appearing gallbladder. Pancreas: No mass or inflammatory process identified on this un-enhanced exam. Spleen: Within normal limits in size and appearance. Adrenals/Urinary Tract: Normal appearing adrenal glands, kidneys, ureters and urinary bladder. No calculi or hydronephrosis. No visible mass. Stomach/Bowel: Mild sigmoid colon diverticulosis. No gastric or small bowel abnormalities. Surgically absent appendix. Vascular/Lymphatic: No pathologically enlarged lymph nodes. No evidence of abdominal aortic aneurysm. Reproductive: Small calcified uterine fibroid.  No adnexal mass. Other: None. Musculoskeletal: Lumbar and lower thoracic spine degenerative changes. IMPRESSION: No acute abnormality. Electronically Signed   By: Beckie Salts M.D.   On: 11/09/2015 09:38   Dg Chest 2 View  Result Date: 11/09/2015 CLINICAL DATA:  84 year old female with a history of left upper quadrant pain EXAM: CHEST  2 VIEW COMPARISON:  04/02/2013, 12/01/2007 FINDINGS: Cardiomediastinal silhouette unchanged in size and  contour. No confluent airspace disease, pneumothorax, or pleural effusion. Unremarkable appearance of the upper abdomen. IMPRESSION: No radiographic evidence of acute cardiopulmonary disease, background chronic changes. Aortic atherosclerosis. Signed, Yvone Neu. Loreta Ave, DO [facility] Electronically Signed   By: Gilmer Mor D.O.   On: 11/09/2015 09:31    Assessment & Plan:   Jenia was seen today for hypertension, osteoarthritis and hyperlipidemia.  Diagnoses and all orders for this visit:  Essential hypertension, benign- Her BP is well controlled. -     CBC with Differential/Platelet; Future -     Comprehensive metabolic panel; Future  Simple hepatic cyst- LFT's are normal. No complications noted. -     Comprehensive metabolic panel; Future  Hyperlipidemia with target LDL less than 100- Statin tx is not indicated -     Lipid panel; Future  Left thyroid nodule- Her TSH is normal and she is euthyroid -     TSH; Future  Prediabetes- Her blood sugars are adequately well controlled -     Hemoglobin A1c; Future  Primary osteoarthritis involving multiple joints -     traMADol (ULTRAM) 50 MG tablet; Take 1 tablet (50 mg total) by mouth every 12 (twelve) hours as  needed.   I am having Lorice M. Cashen start on traMADol. I am also having her maintain her fish oil-omega-3 fatty acids, Vitamin B-12, selenium, albuterol, latanoprost, desloratadine, fluticasone, lansoprazole, metoprolol succinate, and acetaminophen.  Meds ordered this encounter  Medications  . traMADol (ULTRAM) 50 MG tablet    Sig: Take 1 tablet (50 mg total) by mouth every 12 (twelve) hours as needed.    Dispense:  180 tablet    Refill:  1     Follow-up: No follow-ups on file.  Sanda Linger, MD

## 2018-01-08 NOTE — Telephone Encounter (Signed)
Informed pt that DEXA, Tdap, Pneumo Vacc, and Urin are not due. Pt stated understanding.

## 2018-01-14 NOTE — Telephone Encounter (Signed)
Pt called requesting lab work results.

## 2018-01-14 NOTE — Telephone Encounter (Signed)
Lab Results  Component Value Date   WBC 5.2 01/07/2018   HGB 13.6 01/07/2018   HCT 40.1 01/07/2018   PLT 180.0 01/07/2018   GLUCOSE 88 01/07/2018   CHOL 211 (H) 01/07/2018   TRIG 101.0 01/07/2018   HDL 72.50 01/07/2018   LDLDIRECT 144.4 10/30/2012   LDLCALC 118 (H) 01/07/2018   ALT 14 01/07/2018   AST 18 01/07/2018   NA 139 01/07/2018   K 4.7 01/07/2018   CL 103 01/07/2018   CREATININE 0.87 01/07/2018   BUN 20 01/07/2018   CO2 32 01/07/2018   TSH 1.23 01/07/2018   HGBA1C 5.8 01/07/2018   MICROALBUR <0.7 02/20/2017    Her labs were all okay. Her A1c is at 5.8%.  This is excellent blood sugar control. I do not see any concerns here.

## 2018-01-14 NOTE — Telephone Encounter (Signed)
Pt informed of her lab results

## 2018-01-14 NOTE — Telephone Encounter (Signed)
Can you release the lab results/interpretation when you have a chance?

## 2018-01-23 ENCOUNTER — Ambulatory Visit (INDEPENDENT_AMBULATORY_CARE_PROVIDER_SITE_OTHER): Payer: Medicare Other | Admitting: Nurse Practitioner

## 2018-01-23 ENCOUNTER — Encounter: Payer: Self-pay | Admitting: Nurse Practitioner

## 2018-01-23 VITALS — BP 140/70 | HR 76 | Temp 99.4°F | Ht 61.0 in | Wt 133.0 lb

## 2018-01-23 DIAGNOSIS — J301 Allergic rhinitis due to pollen: Secondary | ICD-10-CM

## 2018-01-23 DIAGNOSIS — J069 Acute upper respiratory infection, unspecified: Secondary | ICD-10-CM

## 2018-01-23 MED ORDER — BENZONATATE 100 MG PO CAPS: 100.0000 mg | ORAL_CAPSULE | Freq: Two times a day (BID) | ORAL | 0 refills | Status: DC | PRN

## 2018-01-23 MED ORDER — FLUTICASONE PROPIONATE 50 MCG/ACT NA SUSP: NASAL | 1 refills | Status: DC

## 2018-01-23 MED FILL — FLUTICASONE PROP 50 MCG SPR: 50 | 30 days supply | Qty: 16 | Fill #0

## 2018-01-23 MED FILL — BENZONATATE 100 MG CAP: 100 | 10 days supply | Qty: 20 | Fill #0

## 2018-01-23 NOTE — Progress Notes (Signed)
Name: Brianna Mcdonald   MRN: [id number]    DOB: 07/02/1932   Date:01/23/2018       Progress Note  Subjective  Chief Complaint  Chief Complaint  Patient presents with  . Cough    x 5 days    HPI  Brianna Mcdonald is here today for evaluation of an acute complaint of cold symptoms, which began this past Thursday or Friday. She reports chills, headaches, nasal congestion, sneezing, dry cough, body aches. she's tried ginger, cinnamon, cranberry tea at home, has not tried any medications She does not feel any worse today than when symptoms started, but no better either. She reports similar symptoms this past spring and she was treated for a viral infection, she says she was given tessalon which really helped her cough.   Patient Active Problem List   Diagnosis Date Noted  . Prediabetes 01/07/2018  . Primary osteoarthritis involving multiple joints 01/07/2018  . Laryngitis, chronic 01/03/2017  . Routine general medical examination at a health care facility 03/14/2015  . Osteoporosis 02/13/2012  . Cervical radiculitis 01/23/2012  . Left thyroid nodule 07/12/2011  . Hyperlipidemia with target LDL less than 100 12/26/2010  . Sleep apnea 08/23/2010  . Essential hypertension, benign 09/23/2008  . Allergic rhinitis 10/29/2007  . GERD 10/29/2007  . Irritable bowel syndrome 09/27/2007  . Simple hepatic cyst 09/27/2007    Social History   Tobacco Use  . Smoking status: Never Smoker  . Smokeless tobacco: Never Used  Substance Use Topics  . Alcohol use: No     Current Outpatient Medications:  .  acetaminophen (TYLENOL) 325 MG tablet, Take 650 mg by mouth every 6 (six) hours as needed., Disp: , Rfl:  .  albuterol (PROAIR HFA) 108 (90 Base) MCG/ACT inhaler, Inhale 1 puff into the lungs every 6 (six) hours as needed for wheezing or shortness of breath (or with excersize)., Disp: 18 g, Rfl: 0 .  Cyanocobalamin (VITAMIN B-12) 1000 MCG SUBL, Place 1 tablet under the tongue daily. , Disp:  , Rfl:  .  desloratadine (CLARINEX) 5 MG tablet, TAKE 1 TABLET BY MOUTH ONCE DAILY, Disp: 90 tablet, Rfl: 1 .  fish oil-omega-3 fatty acids 1000 MG capsule, Take 1 capsule by mouth daily. , Disp: , Rfl:  .  fluticasone (FLONASE) 50 MCG/ACT nasal spray, USE 2 SPRAY(S) IN EACH NOSTRIL ONCE DAILY, Disp: 48 g, Rfl: 1 .  lansoprazole (PREVACID) 30 MG capsule, TAKE 1 CAPSULE BY MOUTH ONCE DAILY, Disp: 30 capsule, Rfl: 11 .  latanoprost (XALATAN) 0.005 % ophthalmic solution, , Disp: , Rfl:  .  metoprolol succinate (TOPROL-XL) 25 MG 24 hr tablet, TAKE 1 TABLET BY MOUTH ONCE DAILY, Disp: 30 tablet, Rfl: 5 .  selenium 50 MCG TABS tablet, Take 200 mcg by mouth daily. Patient states that she is takin g 200 mcg., Disp: , Rfl:  .  traMADol (ULTRAM) 50 MG tablet, Take 1 tablet (50 mg total) by mouth every 12 (twelve) hours as needed., Disp: 180 tablet, Rfl: 1  Allergies  Allergen Reactions  . Aspirin Shortness Of Breath  . Moxifloxacin Shortness Of Breath  . Penicillins Shortness Of Breath and Itching    Did PCN reaction causing immediate rash, facial/tongue/throat swelling, SOB or lightheadedness with hypotension: yes Did PCN reaction causing severe rash involving mucus membranes or skin necrosis: no Did PCN reaction that required hospitalization: was already prepared for allergic reaction, did not have to go to hospital Did PCN reaction occurring within the last 10 years: no  If all of the above answers are "NO", then may proceed with Cephalosporin use.   Marland Kitchen. Propofol Anaphylaxis    Stopped breathing  . Hydrochlorothiazide Nausea And Vomiting  . Metformin And Related     Leg and abdominal cramps  . Sulfonamide Derivatives Other (See Comments)    unknown    ROS  No other specific complaints in a complete review of systems (except as listed in HPI above).  Objective  Vitals:   01/23/18 1517  BP: 140/70  Pulse: 76  Temp: 99.4 F (37.4 C)  TempSrc: Oral  SpO2: 97%  Weight: 133 lb (60.3 kg)   Height: 5\' 1"  (1.549 m)    Body mass index is 25.13 kg/m.  Nursing Note and Vital Signs reviewed.  Physical Exam  Constitutional: Patient appears well-developed and well-nourished.  No distress.  HEENT: head atraumatic, normocephalic, pupils equal and reactive to light, EOM's intact, TM's without erythema or bulging, no maxillary or frontal sinus tenderness , neck supple without lymphadenopathy, oropharynx pink and moist without exudate Cardiovascular: Normal rate, regular rhythm, distal pulses intact Pulmonary/Chest: Effort normal and breath sounds clear. No respiratory distress or retractions. Neurological: She is alert and oriented to person, place, and time. No cranial nerve deficit. Coordination, balance, strength, speech and gait are normal.  Skin: Skin is warm and dry. No rash noted. No erythema.  Psychiatric: Patient has a normal mood and affect. behavior is normal. Judgment and thought content normal.  Assessment & Plan  Allergic rhinitis due to pollen - fluticasone (FLONASE) 50 MCG/ACT nasal spray; USE 1 SPRAY(S) IN EACH NOSTRIL ONCE DAILY  Dispense: 48 g; Refill: 1  Viral URI We discussed that at this point, symptoms are likely viral in nature Discussed home management of symptoms, Red flags and when to present for emergency care or RTC including fever >101.78F, chest pain, shortness of breath, new/worsening/un-resolving symptoms and provided additional information in AVS. Tessalon sent, flonase refill sent-dosing and side effects discussed - benzonatate (TESSALON) 100 MG capsule; Take 1 capsule (100 mg total) by mouth 2 (two) times daily as needed for cough.  Dispense: 20 capsule; Refill: 0 - fluticasone (FLONASE) 50 MCG/ACT nasal spray; USE 1 SPRAY(S) IN EACH NOSTRIL ONCE DAILY  Dispense: 48 g; Refill: 1

## 2018-01-23 NOTE — Patient Instructions (Addendum)
I have sent a prescription for tessalon perles as needed for your cough. I have sent a prescription for flonase nasal spray to your pharmacy- you may start with 2 sprays in each nostril daily for 1 week. Saline nasal spray used frequently. For drainage may use Allegra, Claritin or Zyrtec. If you need stronger medicine to stop drainage may take Chlor-Trimeton 2-4 mg every 4 hours. This may cause drowsiness. Honey to soothe your throat.  Please follow up for fevers over 101, if your symptoms get worse, or if your symptoms dont get better with the antibiotic.  Upper Respiratory Infection, Adult Most upper respiratory infections (URIs) are caused by a virus. A URI affects the nose, throat, and upper air passages. The most common type of URI is often called "the common cold." Follow these instructions at home:  Take medicines only as told by your doctor.  Gargle warm saltwater or take cough drops to comfort your throat as told by your doctor.  Use a warm mist humidifier or inhale steam from a shower to increase air moisture. This may make it easier to breathe.  Drink enough fluid to keep your pee (urine) clear or pale yellow.  Eat soups and other clear broths.  Have a healthy diet.  Rest as needed.  Go back to work when your fever is gone or your doctor says it is okay. ? You may need to stay home longer to avoid giving your URI to others. ? You can also wear a face mask and wash your hands often to prevent spread of the virus.  Use your inhaler more if you have asthma.  Do not use any tobacco products, including cigarettes, chewing tobacco, or electronic cigarettes. If you need help quitting, ask your doctor. Contact a doctor if:  You are getting worse, not better.  Your symptoms are not helped by medicine.  You have chills.  You are getting more short of breath.  You have brown or red mucus.  You have yellow or brown discharge from your nose.  You have pain in your face,  especially when you bend forward.  You have a fever.  You have puffy (swollen) neck glands.  You have pain while swallowing.  You have white areas in the back of your throat. Get help right away if:  You have very bad or constant: ? Headache. ? Ear pain. ? Pain in your forehead, behind your eyes, and over your cheekbones (sinus pain). ? Chest pain.  You have long-lasting (chronic) lung disease and any of the following: ? Wheezing. ? Long-lasting cough. ? Coughing up blood. ? A change in your usual mucus.  You have a stiff neck.  You have changes in your: ? Vision. ? Hearing. ? Thinking. ? Mood. This information is not intended to replace advice given to you by your health care provider. Make sure you discuss any questions you have with your health care provider. Document Released: 10/04/2007 Document Revised: 12/19/2015 Document Reviewed: 07/23/2013 Elsevier Interactive Patient Education  2018 [employer].

## 2018-03-30 ENCOUNTER — Other Ambulatory Visit: Payer: Self-pay | Admitting: Internal Medicine

## 2018-03-31 ENCOUNTER — Other Ambulatory Visit: Payer: Self-pay | Admitting: Internal Medicine

## 2018-06-25 DIAGNOSIS — H401131 Primary open-angle glaucoma, bilateral, mild stage: Secondary | ICD-10-CM | POA: Diagnosis not present

## 2018-06-25 DIAGNOSIS — H2513 Age-related nuclear cataract, bilateral: Secondary | ICD-10-CM | POA: Diagnosis not present

## 2018-06-25 DIAGNOSIS — H5203 Hypermetropia, bilateral: Secondary | ICD-10-CM | POA: Diagnosis not present

## 2018-06-25 DIAGNOSIS — H52203 Unspecified astigmatism, bilateral: Secondary | ICD-10-CM | POA: Diagnosis not present

## 2018-06-25 DIAGNOSIS — H524 Presbyopia: Secondary | ICD-10-CM | POA: Diagnosis not present

## 2018-07-30 ENCOUNTER — Ambulatory Visit: Payer: Medicare Other | Admitting: Internal Medicine

## 2018-09-30 ENCOUNTER — Other Ambulatory Visit: Payer: Self-pay | Admitting: Internal Medicine

## 2018-10-02 ENCOUNTER — Ambulatory Visit: Payer: Medicare Other | Admitting: Internal Medicine

## 2018-10-27 ENCOUNTER — Other Ambulatory Visit: Payer: Self-pay | Admitting: Internal Medicine

## 2018-10-27 DIAGNOSIS — J301 Allergic rhinitis due to pollen: Secondary | ICD-10-CM

## 2018-10-27 DIAGNOSIS — J069 Acute upper respiratory infection, unspecified: Secondary | ICD-10-CM

## 2018-10-31 ENCOUNTER — Ambulatory Visit: Payer: Medicare Other | Admitting: Internal Medicine

## 2018-12-10 DIAGNOSIS — H5203 Hypermetropia, bilateral: Secondary | ICD-10-CM | POA: Diagnosis not present

## 2018-12-10 DIAGNOSIS — H2513 Age-related nuclear cataract, bilateral: Secondary | ICD-10-CM | POA: Diagnosis not present

## 2018-12-10 DIAGNOSIS — H524 Presbyopia: Secondary | ICD-10-CM | POA: Diagnosis not present

## 2018-12-10 DIAGNOSIS — H52203 Unspecified astigmatism, bilateral: Secondary | ICD-10-CM | POA: Diagnosis not present

## 2018-12-10 DIAGNOSIS — H401131 Primary open-angle glaucoma, bilateral, mild stage: Secondary | ICD-10-CM | POA: Diagnosis not present

## 2019-01-14 ENCOUNTER — Ambulatory Visit: Payer: Medicare Other | Admitting: Internal Medicine

## 2019-02-28 ENCOUNTER — Ambulatory Visit (INDEPENDENT_AMBULATORY_CARE_PROVIDER_SITE_OTHER): Payer: Medicare Other | Admitting: *Deleted

## 2019-02-28 DIAGNOSIS — Z0000 Encounter for general adult medical examination without abnormal findings: Secondary | ICD-10-CM | POA: Diagnosis not present

## 2019-02-28 DIAGNOSIS — Z599 Problem related to housing and economic circumstances, unspecified: Secondary | ICD-10-CM

## 2019-02-28 DIAGNOSIS — Z598 Other problems related to housing and economic circumstances: Secondary | ICD-10-CM

## 2019-02-28 NOTE — Progress Notes (Addendum)
Subjective:   Brianna Mcdonald is a 85 y.o. female who presents for Medicare Annual (Subsequent) preventive examination. I connected with patient by a telephone and verified that I am speaking with the correct person using two identifiers. Patient stated full name and DOB. Patient gave permission to continue with telephonic visit. Patient's location was at home and Nurse's location was at [facility] office. Participants during this visit included patient and nurse.  Review of Systems:   Cardiac Risk Factors include: advanced age (>5055men, 42>65 women) Sleep patterns: feels rested on waking, gets up 1-2 times nightly to void and sleeps 6-7 hours nightly.    Home Safety/Smoke Alarms: Feels safe in home. Smoke alarms in place.  Living environment; residence and Firearm Safety: 1-story house/ trailer. Daughter and grandchildren live with her, no needs for DME, good support systemSeat Belt Safety/Bike Helmet: Wears seat belt.      Objective:     Vitals: There were no vitals taken for this visit.  There is no height or weight on file to calculate BMI.  Advanced Directives 02/28/2019 01/07/2018 05/26/2016 05/25/2016 11/09/2015 03/14/2015  Does Patient Have a Medical Advance Directive? No No Yes No No Yes  Type of Advance Directive - Web designer- Healthcare Power of Attorney;Living will - - Living will;Healthcare Power of Attorney  Does patient want to make changes to medical advance directive? - - Yes (MAU/Ambulatory/Procedural Areas - Information given) - - No - Patient declined  Copy of Healthcare Power of Attorney in Chart? - - No - copy requested - - Yes  Would patient like information on creating a medical advance directive? Yes (ED - Information included in AVS) Yes (ED - Information included in AVS) - No - Patient declined No - patient declined information -    Tobacco Social History   Tobacco Use  Smoking Status Never Smoker  Smokeless Tobacco Never Used     Counseling given: Not Answered  Past  Medical History:  Diagnosis Date  . Allergy   . Bronchitis, not specified as acute or chronic   . Chest pain, unspecified   . Diabetes mellitus   . Edema   . GERD (gastroesophageal reflux disease)   . Hyperlipidemia   . Hypertension   . Irritable bowel syndrome   . Other specified disorders of liver   . Palpitations    Past Surgical History:  Procedure Laterality Date  . APPENDECTOMY    . DILATION AND CURETTAGE OF UTERUS     x 3  . WRIST SURGERY     Family History  Problem Relation Age of Onset  . Asthma Mother   . Arthritis Mother        rheumatism  . Allergies Sister   . Drug abuse Other   . Allergies Other   . Arthritis Other   . Cancer Brother        tear ducts.    Social History   Socioeconomic History  . Marital status: Widowed    Spouse name: Not on file  . Number of children: 1  . Years of education: Not on file  . Highest education level: Not on file  Occupational History  . Occupation: retired  Engineer, productionocial Needs  . Financial resource strain: Not very hard  . Food insecurity    Worry: Never true    Inability: Never true  . Transportation needs    Medical: No    Non-medical: No  Tobacco Use  . Smoking status: Never Smoker  . Smokeless tobacco: Never  Used  Substance and Sexual Activity  . Alcohol use: No  . Drug use: No  . Sexual activity: Not Currently    Birth control/protection: Post-menopausal  Lifestyle  . Physical activity    Days per week: 0 days    Minutes per session: 0 min  . Stress: To some extent  Relationships  . Social connections    Talks on phone: More than three times a week    Gets together: More than three times a week    Attends religious service: More than 4 times per year    Active member of club or organization: Yes    Attends meetings of clubs or organizations: More than 4 times per year    Relationship status: Widowed  Other Topics Concern  . Not on file  Social History Narrative   Adopted 1 daughter who lives with  her. Patient states this has brought a lot of stress to her life but states presently she feels safe in her home environment. She seeks counsel from her church.    Widowed   retired    Outpatient Encounter Medications as of 02/28/2019  Medication Sig  . acetaminophen (TYLENOL) 325 MG tablet Take 650 mg by mouth every 6 (six) hours as needed.  Marland Kitchen albuterol (PROAIR HFA) 108 (90 Base) MCG/ACT inhaler Inhale 1 puff into the lungs every 6 (six) hours as needed for wheezing or shortness of breath (or with excersize).  . Cyanocobalamin (VITAMIN B-12) 1000 MCG SUBL Place 1 tablet under the tongue daily.   Marland Kitchen desloratadine (CLARINEX) 5 MG tablet Take 1 tablet by mouth once daily  . fish oil-omega-3 fatty acids 1000 MG capsule Take 1 capsule by mouth daily.   . fluticasone (FLONASE) 50 MCG/ACT nasal spray Use 2 spray(s) in each nostril once daily  . lansoprazole (PREVACID) 30 MG capsule TAKE 1 CAPSULE BY MOUTH ONCE DAILY (Patient taking differently: as needed. )  . latanoprost (XALATAN) 0.005 % ophthalmic solution   . metoprolol succinate (TOPROL-XL) 25 MG 24 hr tablet Take 1 tablet by mouth once daily  . selenium 50 MCG TABS tablet Take 200 mcg by mouth daily. Patient states that she is takin g 200 mcg.  . traMADol (ULTRAM) 50 MG tablet Take 1 tablet (50 mg total) by mouth every 12 (twelve) hours as needed.  . [DISCONTINUED] benzonatate (TESSALON) 100 MG capsule Take 1 capsule (100 mg total) by mouth 2 (two) times daily as needed for cough. (Patient not taking: Reported on 02/28/2019)   No facility-administered encounter medications on file as of 02/28/2019.     Activities of Daily Living In your present state of health, do you have any difficulty performing the following activities: 02/28/2019  Hearing? N  Vision? N  Difficulty concentrating or making decisions? N  Walking or climbing stairs? N  Dressing or bathing? N  Doing errands, shopping? N  Preparing Food and eating ? N  Using the Toilet?  N  In the past six months, have you accidently leaked urine? N  Do you have problems with loss of bowel control? N  Managing your Medications? N  Managing your Finances? N  Housekeeping or managing your Housekeeping? N  Some recent data might be hidden    Patient Care Team: Janith Lima, MD as PCP - General (Internal Medicine) Edrick Kins, DPM as Consulting Physician (Podiatry) Christean Grief, MD (Ophthalmology)    Assessment:   This is a routine wellness examination for Brianna Mcdonald. Physical assessment deferred to PCP.  Exercise Activities and Dietary recommendations Current Exercise Habits: Home exercise routine, Type of exercise: walking, Time (Minutes): 35, Frequency (Times/Week): 4, Weekly Exercise (Minutes/Week): 140, Intensity: Mild, Exercise limited by: orthopedic condition(s) Diet (meal preparation, eat out, water intake, caffeinated beverages, dairy products, fruits and vegetables): in general, a "healthy" diet  , well balanced.  Reviewed heart healthy diet. Encouraged patient to increase daily water and healthy fluid intake.  Goals      Patient Stated   . patient states (pt-stated)     "move to [place] or [place]", states for better health care.       Other   . Patient Stated     Stay as healthy and as independent as possible.       Fall Risk Fall Risk  02/28/2019 01/07/2018 07/03/2017 05/25/2016 04/07/2016  Falls in the past year? 0 No No No Yes  Comment - - - - Emmi Telephone Survey: data to providers prior to load  Number falls in past yr: 0 - - - 1  Comment - - - - Emmi Telephone Survey Actual Response = 1  Injury with Fall? 0 - - - No  Risk for fall due to : - Impaired balance/gait - - -   Is the patient's home free of loose throw rugs in walkways, pet beds, electrical cords, etc?   yes      Grab bars in the bathroom? yes      Handrails on the stairs?   yes      Adequate lighting?   yes  Depression Screen PHQ 2/9 Scores 02/28/2019 01/07/2018 07/03/2017  05/25/2016  PHQ - 2 Score 1 1 0 0  PHQ- 9 Score - 3 - -     Cognitive Function MMSE - Mini Mental State Exam 01/07/2018  Orientation to time 5  Orientation to Place 5  Registration 3  Attention/ Calculation 3  Recall 2  Language- name 2 objects 2  Language- repeat 1  Language- follow 3 step command 3  Language- read & follow direction 1  Write a sentence 1  Copy design 1  Total score 27     6CIT Screen 02/28/2019  What Year? 0 points  What month? 0 points  What time? 0 points  Count back from 20 0 points  Months in reverse 0 points  Repeat phrase 4 points  Total Score 4    Immunization History  Administered Date(s) Administered  . Influenza Split 01/23/2012  . Influenza Whole 02/05/2011  . Influenza, High Dose Seasonal PF 02/21/2016, 02/20/2017  . Influenza,inj,Quad PF,6+ Mos 01/17/2013, 01/20/2014, 03/09/2015  . Pneumococcal Conjugate-13 03/04/2013  . Pneumococcal Polysaccharide-23 12/16/2009, 08/24/2015  . Tdap 03/04/2013   Screening Tests Health Maintenance  Topic Date Due  . OPHTHALMOLOGY EXAM  09/06/2017  . URINE MICROALBUMIN  02/20/2018  . FOOT EXAM  07/04/2018  . HEMOGLOBIN A1C  07/08/2018  . INFLUENZA VACCINE  07/31/2019 (Originally 11/30/2018)  . TETANUS/TDAP  03/05/2023  . DEXA SCAN  Completed  . PNA vac Low Risk Adult  Completed      Plan:    Reviewed health maintenance screenings with patient today and relevant education, vaccines, and/or referrals were provided.   I have personally reviewed and noted the following in the patient's chart:   . Medical and social history . Use of alcohol, tobacco or illicit drugs  . Current medications and supplements . Functional ability and status . Nutritional status . Physical activity . Advanced directives . List of other physicians . Screenings  to include cognitive, depression, and falls . Referrals and appointments  In addition, I have reviewed and discussed with patient certain preventive protocols,  quality metrics, and best practice recommendations. A written personalized care plan for preventive services as well as general preventive health recommendations were provided to patient.     Wanda Plump, RN  02/28/2019    Medical screening examination/treatment/procedure(s) were performed by non-physician practitioner and as supervising physician I was immediately available for consultation/collaboration. I agree with above. Pincus Sanes, MD

## 2019-02-28 NOTE — Patient Instructions (Signed)
The [facility] provides information and referral services to aging adults 65+ in [place]. If there are waiting lists for community services, or if services are not available, [facility] connects clients with [facility] volunteers close to them who provide services such as respite care, wheelchair ramp construction, friendly visits, and transportation assistance. [facility]'s Call Center fields more than 350 calls each month.  AAIRS*- and SHIIP*-certified Call Center Specialists are ready to lend a compassionate ear and seek resources Monday through Friday, 9:00 am - 5:00 pm. The Call Center has met the needs of aging adults in all of [place] 100 counties. No religious affiliation is required; the only eligibility criterion is that clients be 65+ or older. Contact [facility] for help. *[employer]erral Systems *Seniors' Health Insurance Information Program Need help? Call 4041293615 today!  www.auntbertha.com or down load app on smart phone  [employer]unt [employer]renice [employer] website lists multiple social resources for individuals such as: food, health, money, house hold goods, transit, medical supplies, job training and legal services.   Continue to eat heart healthy diet (full of fruits, vegetables, whole grains, lean protein, water--limit salt, fat, and sugar intake) and increase physical activity as tolerated.  Continue doing brain stimulating activities (puzzles, reading, adult coloring books, staying active) to keep memory sharp.    Brianna Mcdonald , Thank you for taking time to come for your Medicare Wellness Visit. I appreciate your ongoing commitment to your health goals. Please review the following plan we discussed and let me know if I can assist you in the future.   These are the goals we discussed: Goals      Patient Stated   . patient states (pt-stated)     "move to [place] or [place]", states for better health care.       Other   . Patient Stated     Stay as healthy and as  independent as possible.       This is a list of the screening recommended for you and due dates:  Health Maintenance  Topic Date Due  . Eye exam for diabetics  09/06/2017  . Urine Protein Check  02/20/2018  . Complete foot exam   07/04/2018  . Hemoglobin A1C  07/08/2018  . Flu Shot  07/31/2019*  . Tetanus Vaccine  03/05/2023  . DEXA scan (bone density measurement)  Completed  . Pneumonia vaccines  Completed  *Topic was postponed. The date shown is not the original due date.    Preventive Care 83 Years and Older, Female Preventive care refers to lifestyle choices and visits with your health care provider that can promote health and wellness. This includes:  A yearly physical exam. This is also called an annual well check.  Regular dental and eye exams.  Immunizations.  Screening for certain conditions.  Healthy lifestyle choices, such as diet and exercise. What can I expect for my preventive care visit? Physical exam Your health care provider will check:  Height and weight. These may be used to calculate body mass index (BMI), which is a measurement that tells if you are at a healthy weight.  Heart rate and blood pressure.  Your skin for abnormal spots. Counseling Your health care provider may ask you questions about:  Alcohol, tobacco, and drug use.  Emotional well-being.  Home and relationship well-being.  Sexual activity.  Eating habits.  History of falls.  Memory and ability to understand (cognition).  Work and work Statistician.  Pregnancy and menstrual history. What immunizations do  I need?  Influenza (flu) vaccine  This is recommended every year. Tetanus, diphtheria, and pertussis (Tdap) vaccine  You may need a Td booster every 10 years. Varicella (chickenpox) vaccine  You may need this vaccine if you have not already been vaccinated. Zoster (shingles) vaccine  You may need this after age 65. Pneumococcal conjugate (PCV13) vaccine  One dose is recommended after age 83.  dose is recommended after age 83. Pneumococcal polysaccharide (PPSV23) vaccine  One dose is recommended after age 83. dose is recommended after age 83. Measles, mumps, and rubella (MMR) vaccine  You may need at least one dose of MMR if you were born in 1957 or later. You may also need a second dose. Meningococcal conjugate (MenACWY) vaccine  You may need this if you have certain conditions. Hepatitis A vaccine  You may need this if you have certain conditions or if you travel or work in places where you may be exposed to hepatitis A. Hepatitis B vaccine  You may need this if you have certain conditions or if you travel or work in places where you may be exposed to hepatitis B. Haemophilus influenzae type b (Hib) vaccine  You may need this if you have certain conditions. You may receive vaccines as individual doses or as more than one vaccine together in one shot (combination vaccines). Talk with your health care provider about the risks and benefits of combination vaccines. What tests do I need? Blood tests  Lipid and cholesterol levels. These may be checked every 5 years, or more frequently depending on your overall health.  Hepatitis C test.  Hepatitis B test. Screening  Lung cancer screening. You may have this screening every year starting at age 83 if you have a 30-pack-year history of smoking and currently smoke or have quit within the past 15 years.  Colorectal cancer screening. All adults should have this screening starting at age 83 and continuing until age 29. Your health care provider may recommend screening at age 83 if you are at increased risk. You will have tests every 1-10 years, depending on your results and the type of screening test.  Diabetes screening. This is done by checking your blood sugar (glucose) after you have not eaten for a while (fasting). You may have this done every 1-3 years.  Mammogram. This may be done every 1-2 years. Talk with your health care provider about how often you  should have regular mammograms.  BRCA-related cancer screening. This may be done if you have a family history of breast, ovarian, tubal, or peritoneal cancers. Other tests  Sexually transmitted disease (STD) testing.  Bone density scan. This is done to screen for osteoporosis. You may have this done starting at age 61. Follow these instructions at home: Eating and drinking  Eat a diet that includes fresh fruits and vegetables, whole grains, lean protein, and low-fat dairy products. Limit your intake of foods with high amounts of sugar, saturated fats, and salt.  Take vitamin and mineral supplements as recommended by your health care provider.  Do not drink alcohol if your health care provider tells you not to drink.  If you drink alcohol: ? Limit how much you have to 0-1 drink a day. ? Be aware of how much alcohol is in your drink. In the [place] one drink equals one 12 oz bottle of beer (355 mL), one 5 oz glass of wine (148 mL), or one 1 oz glass of hard liquor (44 mL). Lifestyle  Take daily care of your teeth and gums.  Stay active. Exercise for at least  30 minutes on 5 or more days each week.  Do not use any products that contain nicotine or tobacco, such as cigarettes, e-cigarettes, and chewing tobacco. If you need help quitting, ask your health care provider.  If you are sexually active, practice safe sex. Use a condom or other form of protection in order to prevent STIs (sexually transmitted infections).  Talk with your health care provider about taking a low-dose aspirin or statin. What's next?  Go to your health care provider once a year for a well check visit.  Ask your health care provider how often you should have your eyes and teeth checked.  Stay up to date on all vaccines. This information is not intended to replace advice given to you by your health care provider. Make sure you discuss any questions you have with your health care provider. Document Released:  05/14/2015 Document Revised: 04/11/2018 Document Reviewed: 04/11/2018 Elsevier Patient Education  2020 [employer].

## 2019-03-04 ENCOUNTER — Other Ambulatory Visit: Payer: Self-pay | Admitting: Internal Medicine

## 2019-03-05 ENCOUNTER — Telehealth: Payer: Self-pay

## 2019-03-05 NOTE — Telephone Encounter (Signed)
Copied from [place] [id number]. Topic: Referral - Status >> Mar 05, 2019  3:54 PM Brianna Mcdonald D wrote: 65/09[birth year]125 Spoke with patient about community resources for food, utilities and home repairs. [name] [phone]

## 2019-03-18 ENCOUNTER — Telehealth: Payer: Self-pay

## 2019-03-18 NOTE — Telephone Encounter (Signed)
Copied from [place] [id number]. Topic: Referral - Status >> Mar 18, 2019  6:24 PM Simone Curia D wrote: 46/95/0722 Spoke with patient about community resources for food, utilities and home repairs. Patient travels by [place] and plans on going to [place].  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  [name] [phone]

## 2019-03-24 ENCOUNTER — Telehealth: Payer: Self-pay

## 2019-03-24 NOTE — Telephone Encounter (Signed)
Copied from [place] [id number]. Topic: Referral - Status >> Mar 24, 2019  7:09 PM Simone Curia D wrote: 64/38/3818 Spoke with patient about [employer] and [employer]. Both organizations take applications over the phone and have to speak to the person receiving services. Patient stated she would call tomorrow. [name] [phone]

## 2019-03-31 ENCOUNTER — Other Ambulatory Visit: Payer: Self-pay | Admitting: Internal Medicine

## 2019-07-14 ENCOUNTER — Telehealth: Payer: Self-pay

## 2019-07-14 NOTE — Telephone Encounter (Signed)
1.Medication Requested:metoprolol succinate (TOPROL-XL) 25 MG 24 hr tablet  2. Pharmacy (Name, Street, [place]): [id number] - [place] ([place]), [place] - [address]  3. On Med List:  Yes   4. Last Visit with PCP: 9.15.20   5. Next visit date with PCP: no appt made at this time   Agent: Please be advised that RX refills may take up to 3 business days. We ask that you follow-up with your pharmacy.

## 2019-07-15 NOTE — Telephone Encounter (Signed)
Pt will have to have an appt scheduled.

## 2019-07-16 ENCOUNTER — Other Ambulatory Visit: Payer: Self-pay | Admitting: Internal Medicine

## 2019-07-16 NOTE — Telephone Encounter (Signed)
Pt contacted and informed that an appt would need to be made before we could send in any refills.  Pt will call back to schedule.

## 2019-07-21 ENCOUNTER — Ambulatory Visit (INDEPENDENT_AMBULATORY_CARE_PROVIDER_SITE_OTHER): Payer: Medicare Other | Admitting: Internal Medicine

## 2019-07-21 ENCOUNTER — Other Ambulatory Visit: Payer: Self-pay

## 2019-07-21 ENCOUNTER — Encounter: Payer: Self-pay | Admitting: Internal Medicine

## 2019-07-21 VITALS — BP 146/70 | HR 80 | Temp 98.3°F | Resp 16 | Ht 61.0 in | Wt 141.0 lb

## 2019-07-21 DIAGNOSIS — E785 Hyperlipidemia, unspecified: Secondary | ICD-10-CM

## 2019-07-21 DIAGNOSIS — I10 Essential (primary) hypertension: Secondary | ICD-10-CM | POA: Diagnosis not present

## 2019-07-21 DIAGNOSIS — R7303 Prediabetes: Secondary | ICD-10-CM | POA: Diagnosis not present

## 2019-07-21 DIAGNOSIS — E041 Nontoxic single thyroid nodule: Secondary | ICD-10-CM | POA: Diagnosis not present

## 2019-07-21 DIAGNOSIS — Z0000 Encounter for general adult medical examination without abnormal findings: Secondary | ICD-10-CM

## 2019-07-21 LAB — HEPATIC FUNCTION PANEL
ALT: 9 U/L (ref 0–35)
AST: 18 U/L (ref 0–37)
Albumin: 3.9 g/dL (ref 3.5–5.2)
Alkaline Phosphatase: 60 U/L (ref 39–117)
Bilirubin, Direct: 0.1 mg/dL (ref 0.0–0.3)
Total Bilirubin: 0.5 mg/dL (ref 0.2–1.2)
Total Protein: 7.9 g/dL (ref 6.0–8.3)

## 2019-07-21 LAB — CBC WITH DIFFERENTIAL/PLATELET
Basophils Absolute: 0 10*3/uL (ref 0.0–0.1)
Basophils Relative: 0.5 % (ref 0.0–3.0)
Eosinophils Absolute: 0.1 10*3/uL (ref 0.0–0.7)
Eosinophils Relative: 1.4 % (ref 0.0–5.0)
HCT: 38.7 % (ref 36.0–46.0)
Hemoglobin: 13.1 g/dL (ref 12.0–15.0)
Lymphocytes Relative: 38.5 % (ref 12.0–46.0)
Lymphs Abs: 2.6 10*3/uL (ref 0.7–4.0)
MCHC: 33.7 g/dL (ref 30.0–36.0)
MCV: 95.8 fl (ref 78.0–100.0)
Monocytes Absolute: 0.6 10*3/uL (ref 0.1–1.0)
Monocytes Relative: 9.3 % (ref 3.0–12.0)
Neutro Abs: 3.4 10*3/uL (ref 1.4–7.7)
Neutrophils Relative %: 50.3 % (ref 43.0–77.0)
Platelets: 214 10*3/uL (ref 150.0–400.0)
RBC: 4.04 Mil/uL (ref 3.87–5.11)
RDW: 13.2 % (ref 11.5–15.5)
WBC: 6.7 10*3/uL (ref 4.0–10.5)

## 2019-07-21 LAB — LIPID PANEL
Cholesterol: 211 mg/dL — ABNORMAL HIGH (ref 0–200)
HDL: 66.6 mg/dL (ref >39.00)
LDL Cholesterol: 129 mg/dL — ABNORMAL HIGH (ref 0–99)
NonHDL: 144.4
Total CHOL/HDL Ratio: 3
Triglycerides: 76 mg/dL (ref 0.0–149.0)
VLDL: 15.2 mg/dL (ref 0.0–40.0)

## 2019-07-21 LAB — BASIC METABOLIC PANEL
BUN: 13 mg/dL (ref 6–23)
CO2: 31 mEq/L (ref 19–32)
Calcium: 10 mg/dL (ref 8.4–10.5)
Chloride: 101 mEq/L (ref 96–112)
Creatinine, Ser: 0.83 mg/dL (ref 0.40–1.20)
GFR: 79.09 mL/min (ref >60.00)
Glucose, Bld: 96 mg/dL (ref 70–99)
Potassium: 3.6 mEq/L (ref 3.5–5.1)
Sodium: 136 mEq/L (ref 135–145)

## 2019-07-21 LAB — HEMOGLOBIN A1C: Hgb A1c MFr Bld: 6.1 % (ref 4.6–6.5)

## 2019-07-21 LAB — TSH: TSH: 1.23 u[IU]/mL (ref 0.35–4.50)

## 2019-07-21 MED ORDER — METOPROLOL SUCCINATE ER 25 MG PO TB24: 25.0000 mg | ORAL_TABLET | Freq: Every day | ORAL | 1 refills | Status: DC

## 2019-07-21 NOTE — Patient Instructions (Signed)

## 2019-07-21 NOTE — Progress Notes (Signed)
Subjective:  Patient ID: Brianna Mcdonald, female    DOB: Apr 21, 1933  Age: 86 y.o. MRN: [id number]  CC: Hyperlipidemia and Hypertension   HPI Brianna Mcdonald presents for f/up- She is in her usual state of health.  She continues to exercise and denies any recent episodes of chest pain, shortness of breath, palpitations, edema, or fatigue.  Outpatient Medications Prior to Visit  Medication Sig Dispense Refill  . acetaminophen (TYLENOL) 325 MG tablet Take 650 mg by mouth every 6 (six) hours as needed.    Marland Kitchen albuterol (PROAIR HFA) 108 (90 Base) MCG/ACT inhaler Inhale 1 puff into the lungs every 6 (six) hours as needed for wheezing or shortness of breath (or with excersize). 18 g 0  . Cyanocobalamin (VITAMIN B-12) 1000 MCG SUBL Place 1 tablet under the tongue daily.     Marland Kitchen desloratadine (CLARINEX) 5 MG tablet Take 1 tablet by mouth once daily 90 tablet 1  . fish oil-omega-3 fatty acids 1000 MG capsule Take 1 capsule by mouth daily.     . fluticasone (FLONASE) 50 MCG/ACT nasal spray Use 2 spray(s) in each nostril once daily 48 g 1  . lansoprazole (PREVACID) 30 MG capsule Take 1 capsule by mouth once daily 90 capsule 1  . latanoprost (XALATAN) 0.005 % ophthalmic solution     . selenium 50 MCG TABS tablet Take 200 mcg by mouth daily. Patient states that she is takin g 200 mcg.    . traMADol (ULTRAM) 50 MG tablet Take 1 tablet (50 mg total) by mouth every 12 (twelve) hours as needed. 180 tablet 1  . metoprolol succinate (TOPROL-XL) 25 MG 24 hr tablet Take 1 tablet by mouth once daily 90 tablet 0   No facility-administered medications prior to visit.    ROS Review of Systems  Constitutional: Negative.  Negative for appetite change, diaphoresis, fatigue and unexpected weight change.  HENT: Negative.   Eyes: Negative for visual disturbance.  Respiratory: Negative for cough, chest tightness, shortness of breath and wheezing.   Cardiovascular: Negative for chest pain, palpitations and leg  swelling.  Gastrointestinal: Negative for abdominal pain, constipation, diarrhea, nausea and vomiting.  Endocrine: Negative.   Genitourinary: Negative.   Musculoskeletal: Negative for arthralgias and myalgias.  Skin: Negative for color change, pallor and rash.  Neurological: Negative.  Negative for dizziness, weakness and light-headedness.  Hematological: Negative for adenopathy. Does not bruise/bleed easily.  Psychiatric/Behavioral: Negative.     Objective:  BP (!) 146/70 (BP Location: Left Arm, Patient Position: Sitting, Cuff Size: Normal)   Pulse 80   Temp 98.3 F (36.8 C) (Oral)   Resp 16   Ht 5\' 1"  (1.549 m)   Wt 141 lb (64 kg)   SpO2 97%   BMI 26.64 kg/m   BP Readings from Last 3 Encounters:  07/21/19 (!) 146/70  01/23/18 140/70  01/07/18 134/72    Wt Readings from Last 3 Encounters:  07/21/19 141 lb (64 kg)  01/23/18 133 lb (60.3 kg)  01/07/18 133 lb (60.3 kg)    Physical Exam Vitals reviewed.  Constitutional:      Appearance: Normal appearance.  HENT:     Nose: Nose normal.     Mouth/Throat:     Mouth: Mucous membranes are moist.  Eyes:     General: No scleral icterus.    Conjunctiva/sclera: Conjunctivae normal.  Neck:     Thyroid: No thyroid mass, thyromegaly or thyroid tenderness.  Cardiovascular:     Rate and Rhythm: Normal rate and  regular rhythm.     Heart sounds: No murmur.  Pulmonary:     Effort: Pulmonary effort is normal.     Breath sounds: No stridor. No wheezing, rhonchi or rales.  Abdominal:     General: Abdomen is flat. Bowel sounds are normal. There is no distension.     Palpations: Abdomen is soft. There is no hepatomegaly, splenomegaly or mass.  Musculoskeletal:        General: Normal range of motion.     Cervical back: Neck supple.     Right lower leg: No edema.     Left lower leg: No edema.  Lymphadenopathy:     Cervical: No cervical adenopathy.  Skin:    General: Skin is warm and dry.     Coloration: Skin is not pale.    Neurological:     General: No focal deficit present.     Mental Status: She is alert.  Psychiatric:        Mood and Affect: Mood normal.        Behavior: Behavior normal.     Lab Results  Component Value Date   WBC 6.7 07/21/2019   HGB 13.1 07/21/2019   HCT 38.7 07/21/2019   PLT 214.0 07/21/2019   GLUCOSE 96 07/21/2019   CHOL 211 (H) 07/21/2019   TRIG 76.0 07/21/2019   HDL 66.60 07/21/2019   LDLDIRECT 144.4 10/30/2012   LDLCALC 129 (H) 07/21/2019   ALT 9 07/21/2019   AST 18 07/21/2019   NA 136 07/21/2019   K 3.6 07/21/2019   CL 101 07/21/2019   CREATININE 0.83 07/21/2019   BUN 13 07/21/2019   CO2 31 07/21/2019   TSH 1.23 07/21/2019   HGBA1C 6.1 07/21/2019   MICROALBUR <0.7 02/20/2017    CT Abdomen Pelvis Wo Contrast  Result Date: 11/09/2015 CLINICAL DATA:  Left upper quadrant abdominal pain. One episode of vomiting last night. EXAM: CT ABDOMEN AND PELVIS WITHOUT CONTRAST TECHNIQUE: Multidetector CT imaging of the abdomen and pelvis was performed following the standard protocol without IV contrast. COMPARISON:  06/10/2007. FINDINGS: Lower chest:  Mild bilateral dependent atelectasis/ scarring. Hepatobiliary: Little change in previously demonstrated liver cysts. Normal appearing gallbladder. Pancreas: No mass or inflammatory process identified on this un-enhanced exam. Spleen: Within normal limits in size and appearance. Adrenals/Urinary Tract: Normal appearing adrenal glands, kidneys, ureters and urinary bladder. No calculi or hydronephrosis. No visible mass. Stomach/Bowel: Mild sigmoid colon diverticulosis. No gastric or small bowel abnormalities. Surgically absent appendix. Vascular/Lymphatic: No pathologically enlarged lymph nodes. No evidence of abdominal aortic aneurysm. Reproductive: Small calcified uterine fibroid.  No adnexal mass. Other: None. Musculoskeletal: Lumbar and lower thoracic spine degenerative changes. IMPRESSION: No acute abnormality. Electronically Signed    By: Beckie Salts M.D.   On: 11/09/2015 09:38   DG Chest 2 View  Result Date: 11/09/2015 CLINICAL DATA:  86 year old female with a history of left upper quadrant pain EXAM: CHEST  2 VIEW COMPARISON:  04/02/2013, 12/01/2007 FINDINGS: Cardiomediastinal silhouette unchanged in size and contour. No confluent airspace disease, pneumothorax, or pleural effusion. Unremarkable appearance of the upper abdomen. IMPRESSION: No radiographic evidence of acute cardiopulmonary disease, background chronic changes. Aortic atherosclerosis. Signed, Yvone Neu. Loreta Ave, DO [facility] Radiology Electronically Signed   By: Gilmer Mor D.O.   On: 11/09/2015 09:31   ECHOCARDIOGRAM COMPLETE  Result Date: 11/09/2015                            *  [place]*                  *[facility]*                          [address]                        [place], [place] [address]                            [phone] ------------------------------------------------------------------- Transthoracic Echocardiography Patient:    Arlice ColtBrockington, Orville M MR #:       [id number] Study Date: 11/09/2015 Gender:     F Age:        [age] Height:     154.9 cm Weight:     64.4 kg BSA:        1.68 m^2 Pt. Status: Room:       1415  ADMITTING    Hartley BarefootRegalado, Belkys A  ORDERING     Zoila ShutterKenneth Hilty MD  REFERRING    Zoila ShutterKenneth Hilty MD  ATTENDING    Tilden FossaRees, Elizabeth [phone]  PERFORMING   [facility], Inpatient  SONOGRAPHER  Leta Junglingiffany Cooper, RDCS cc: ------------------------------------------------------------------- LV EF: 60% -   65% ------------------------------------------------------------------- Indications:     Abnormal blood test 790.99. Elevated Troponin. Chest pain 786.51. ------------------------------------------------------------------- History:   PMH:  Gastroenteritis.  Risk factors:  Hypertension. Diabetes mellitus. Dyslipidemia. ------------------------------------------------------------------- Study  Conclusions - Left ventricle: The cavity size was normal. Wall thickness was   normal. Systolic function was normal. The estimated ejection   fraction was in the range of 60% to 65%. Wall motion was normal;   there were no regional wall motion abnormalities. Doppler   parameters are consistent with abnormal left ventricular   relaxation (grade 1 diastolic dysfunction). ------------------------------------------------------------------- Study data:  No prior study was available for comparison.  Study status:  Routine.  Procedure:  The patient reported no pain pre or post test. Transthoracic echocardiography. Image quality was adequate.  Study completion:  There were no complications. Transthoracic echocardiography.  M-mode, complete 2D, spectral Doppler, and color Doppler.  Birthdate:  Patient birthdate: 03-15-1933.  Age:  Patient is 86 yr old.  Sex:  Gender: female. BMI: 26.8 kg/m^2.  Blood pressure:     117/61  Patient status: Inpatient.  Study date:  Study date: 11/09/2015. Study time: 01:48 PM.  Location:  Emergency department. ------------------------------------------------------------------- ------------------------------------------------------------------- Left ventricle:  The cavity size was normal. Wall thickness was normal. Systolic function was normal. The estimated ejection fraction was in the range of 60% to 65%. Wall motion was normal; there were no regional wall motion abnormalities. Doppler parameters are consistent with abnormal left ventricular relaxation (grade 1 diastolic dysfunction). ------------------------------------------------------------------- Aortic valve:   Structurally normal valve.   Cusp separation was normal.  Doppler:  Transvalvular velocity was within the normal range. There was no stenosis. There was no regurgitation. ------------------------------------------------------------------- Aorta:  Aortic root: The aortic root was normal in size. Ascending aorta: The ascending aorta  was normal in size. ------------------------------------------------------------------- Mitral valve:   Mildly thickened leaflets .  Doppler:  There was trivial regurgitation.    Peak gradient (D): 5 mm Hg. ------------------------------------------------------------------- Left atrium:  The atrium was normal in size. ------------------------------------------------------------------- Right ventricle:  The cavity size was normal. Systolic function was normal. ------------------------------------------------------------------- Pulmonic valve:    The valve appears to be grossly normal. Doppler:  There was trivial regurgitation. ------------------------------------------------------------------- Tricuspid valve:   Structurally normal valve.   Leaflet separation was normal.  Doppler:  Transvalvular velocity was within the normal range. There was mild regurgitation. ------------------------------------------------------------------- Pulmonary artery:   Systolic pressure was at the upper limits of normal. ------------------------------------------------------------------- Right atrium:  The atrium was normal in size. ------------------------------------------------------------------- Pericardium:  There was no pericardial effusion. ------------------------------------------------------------------- Measurements  Left ventricle                         Value        Reference  LV ID, ED, PLAX chordal        (L)     32.9  mm     43 - 52  LV ID, ES, PLAX chordal                23    mm     23 - 38  LV fx shortening, PLAX chordal         30    %      >=29  LV PW thickness, ED                    6.79  mm     ---------  IVS/LV PW ratio, ED            (H)     1.74         <=1.3  Stroke volume, 2D                      55    ml     ---------  Stroke volume/bsa, 2D                  33    ml/m^2 ---------  LV e&', lateral                         6.96  cm/s   ---------  LV E/e&', lateral                       16.38        ---------  LV  e&', medial                          7.62  cm/s   ---------  LV E/e&', medial                        14.96        ---------  LV e&', average                         7.29  cm/s   ---------  LV E/e&', average                       15.64        ---------   Ventricular septum                     Value        Reference  IVS thickness, ED                      11.8  mm     ---------   LVOT  Value        Reference  LVOT ID, S                             16    mm     ---------  LVOT area                              2.01  cm^2   ---------  LVOT peak velocity, S                  136   cm/s   ---------  LVOT mean velocity, S                  88.7  cm/s   ---------  LVOT VTI, S                            27.5  cm     ---------  LVOT peak gradient, S                  7     mm Hg  ---------   Aorta                                  Value        Reference  Aortic root ID, ED                     30    mm     ---------   Left atrium                            Value        Reference  LA ID, A-P, ES                         22    mm     ---------  LA ID/bsa, A-P                         1.31  cm/m^2 <=2.2  LA volume, S                           25.2  ml     ---------  LA volume/bsa, S                       15    ml/m^2 ---------  LA volume, ES, 1-p A4C                 18.8  ml     ---------  LA volume/bsa, ES, 1-p A4C             11.2  ml/m^2 ---------  LA volume, ES, 1-p A2C                 30.6  ml     ---------  LA volume/bsa, ES, 1-p A2C             18.2  ml/m^2 ---------   Mitral valve  Value        Reference  Mitral E-wave peak velocity            114   cm/s   ---------  Mitral A-wave peak velocity            152   cm/s   ---------  Mitral deceleration time       (H)     246   ms     150 - 230  Mitral peak gradient, D                5     mm Hg  ---------  Mitral E/A ratio, peak                 0.8          ---------   Pulmonary arteries                     Value         Reference  PA pressure, S, DP                     30    mm Hg  <=30   Tricuspid valve                        Value        Reference  Tricuspid regurg peak velocity         258   cm/s   ---------  Tricuspid peak RV-RA gradient          27    mm Hg  ---------   Systemic veins                         Value        Reference  Estimated CVP                          3     mm Hg  ---------   Right ventricle                        Value        Reference  TAPSE                                  17.5  mm     ---------  RV pressure, S, DP                     30    mm Hg  <=30  RV s&', lateral, S                      9.68  cm/s   --------- Legend: (L)  and  (H)  mark values outside specified reference range. ------------------------------------------------------------------- Prepared and Electronically Authenticated by Kristeen Miss, M.D. 2017-07-11T16:15:10  VAS [place] LOWER EXTREMITY VENOUS (DVT)  Result Date: 11/10/2015                     [facility]*                  *[facility]*  [address]                        [place], [place] [address]                            [phone] ------------------------------------------------------------------- Noninvasive Vascular Lab Bilateral Lower Extremity Venous Duplex Evaluation Patient:    Lessly, Stigler MR #:       [id number] Study Date: 11/09/2015 Gender:     F Age:        54 Height: Weight: BSA: Pt. Status: Room:       1415  ADMITTING    Regalado, Belkys A  ORDERING     Regalado, Belkys A  REFERRING    Regalado, Belkys A  ATTENDING    Susa Raring K  SONOGRAPHER  Jenetta Loges, RVT, RDMS Reports also to: ------------------------------------------------------------------- History and indications: Indications 729.5 Pain in limb.  History Diagnostic evaluation. ------------------------------------------------------------------- Study information: Study status:  Routine.  Procedure:  A vascular evaluation was performed  with the patient in the supine position. The right common femoral, right femoral, right profunda femoral, right popliteal, right peroneal, right posterior tibial, left common femoral, left femoral, left profunda femoral, left popliteal, left peroneal, and left posterior tibial veins were studied. Image quality was good.  Bilateral lower extremity venous duplex evaluation.     Doppler flow study including B-mode compression maneuvers of all visualized segments, color flow Doppler and selected views of pulsed wave Doppler.  Birthdate:  Patient birthdate: 1932-12-04.  Age:  Patient is 86 yr old.  Sex:  Gender: female.  Study date:  Study date: 11/09/2015. Study time: 03:15 PM.  Location:  Bedside.  Patient status:  Inpatient. Venous flow: +--------------------------+-------+------------------------------+ Location                  OverallFlow properties                +--------------------------+-------+------------------------------+ Right common femoral      Patent Phasic; spontaneous;                                            compressible                   +--------------------------+-------+------------------------------+ Right femoral             Patent Compressible                   +--------------------------+-------+------------------------------+ Right profunda femoral    Patent Compressible                   +--------------------------+-------+------------------------------+ Right popliteal           Patent Phasic; spontaneous;                                            compressible                   +--------------------------+-------+------------------------------+ Right posterior tibial    Patent Compressible                   +--------------------------+-------+------------------------------+ Right peroneal            Patent Compressible                   +--------------------------+-------+------------------------------+  Right saphenofemoral      Patent  Compressible                   junction                                                        +--------------------------+-------+------------------------------+ Left common femoral       Patent Phasic; spontaneous;                                            compressible                   +--------------------------+-------+------------------------------+ Left femoral              Patent Compressible                   +--------------------------+-------+------------------------------+ Left profunda femoral     Patent Compressible                   +--------------------------+-------+------------------------------+ Left popliteal            Patent Phasic; spontaneous;                                            compressible                   +--------------------------+-------+------------------------------+ Left posterior tibial     Patent Compressible                   +--------------------------+-------+------------------------------+ Left peroneal             Patent Compressible                   +--------------------------+-------+------------------------------+ Left saphenofemoral       Patent Compressible                   junction                                                        +--------------------------+-------+------------------------------+ ------------------------------------------------------------------- Summary: - No evidence of deep vein or superficial thrombosis involving the   right lower extremity and left lower extremity. - No evidence of Baker&'s cyst on the right or left. Other specific details can be found in the table(s) above. Prepared and Electronically Authenticated by Gretta Began, MD 2017-07-12T15:28:04   Assessment & Plan:   Brianna Mcdonald was seen today for hyperlipidemia and hypertension.  Diagnoses and all orders for this visit:  Essential hypertension, benign- Her blood pressure is adequately well controlled.  Electrolytes and renal  are normal. -     CBC with Differential/Platelet; Future -     Basic metabolic panel; Future -     Basic metabolic panel -     CBC with Differential/Platelet  Left thyroid nodule- There are no palpable nodules, she appears euthyroid and has a normal TSH level.  This is benign. -  TSH; Future -     TSH  Hyperlipidemia with target LDL less than 100- Statin therapy is not indicated at her age. -     Lipid panel; Future -     Hepatic function panel; Future -     Hepatic function panel -     Lipid panel  Prediabetes- Her A1c is at 6.1%.  Medical therapy is not indicated. -     Basic metabolic panel; Future -     Hemoglobin A1c; Future -     Hemoglobin A1c -     Basic metabolic panel  Routine general medical examination at a health care facility  Other orders -     metoprolol succinate (TOPROL-XL) 25 MG 24 hr tablet; Take 1 tablet (25 mg total) by mouth daily.   I have changed Brianna Mcdonald's metoprolol succinate. I am also having her maintain her fish oil-omega-3 fatty acids, Vitamin B-12, selenium, albuterol, latanoprost, acetaminophen, traMADol, fluticasone, desloratadine, and lansoprazole.  Meds ordered this encounter  Medications  . metoprolol succinate (TOPROL-XL) 25 MG 24 hr tablet    Sig: Take 1 tablet (25 mg total) by mouth daily.    Dispense:  90 tablet    Refill:  1     Follow-up: Return in about 6 months (around 01/21/2020).  Sanda Linger, MD

## 2019-07-22 ENCOUNTER — Encounter: Payer: Self-pay | Admitting: Internal Medicine

## 2019-07-24 ENCOUNTER — Telehealth: Payer: Self-pay | Admitting: Internal Medicine

## 2019-07-24 NOTE — Telephone Encounter (Signed)
New Message:   Pt is calling and wanting the results of her labs. She states her phone does not ring so I asked to patient to call back tomorrow at 9:00 and hopefully someone will be available to go over those results with her. Please advise.

## 2019-07-25 NOTE — Telephone Encounter (Signed)
Tried to call pt, no vm to lvm.   May inform pt of following: Per Dr. Yetta Barre, all labs came back normal and a letter has been sent with the details of the labs.

## 2019-09-09 ENCOUNTER — Telehealth: Payer: Self-pay

## 2019-09-09 NOTE — Telephone Encounter (Deleted)
ERROR

## 2019-09-10 ENCOUNTER — Ambulatory Visit: Payer: Medicare Other | Admitting: Family

## 2019-09-17 ENCOUNTER — Ambulatory Visit (INDEPENDENT_AMBULATORY_CARE_PROVIDER_SITE_OTHER): Payer: Medicare Other | Admitting: Internal Medicine

## 2019-09-17 ENCOUNTER — Encounter: Payer: Self-pay | Admitting: Internal Medicine

## 2019-09-17 ENCOUNTER — Other Ambulatory Visit: Payer: Self-pay

## 2019-09-17 VITALS — BP 138/68 | HR 80 | Temp 98.3°F | Resp 16 | Ht 61.0 in | Wt 139.0 lb

## 2019-09-17 DIAGNOSIS — M8949 Other hypertrophic osteoarthropathy, multiple sites: Secondary | ICD-10-CM

## 2019-09-17 DIAGNOSIS — I10 Essential (primary) hypertension: Secondary | ICD-10-CM

## 2019-09-17 DIAGNOSIS — M159 Polyosteoarthritis, unspecified: Secondary | ICD-10-CM

## 2019-09-17 MED ORDER — NABUMETONE 500 MG PO TABS: 500.0000 mg | ORAL_TABLET | Freq: Two times a day (BID) | ORAL | 1 refills | Status: DC | PRN

## 2019-09-17 NOTE — Patient Instructions (Signed)

## 2019-09-17 NOTE — Progress Notes (Signed)
Subjective:  Patient ID: Brianna Mcdonald, female    DOB: 05-28-1932  Age: 86 y.o. MRN: [id number]  CC: Hypertension and Osteoarthritis  This visit occurred during the SARS-CoV-2 public health emergency.  Safety protocols were in place, including screening questions prior to the visit, additional usage of staff PPE, and extensive cleaning of exam room while observing appropriate contact time as indicated for disinfecting solutions.    HPI Brianna Mcdonald presents for f/up - She complains of arthritis pain in her large joints.  She was previously prescribed tramadol but she has decided not to take it.  She is not getting much symptom relief with Tylenol.  She has a history of allergy to aspirin but tells me she can take other NSAIDs.  Outpatient Medications Prior to Visit  Medication Sig Dispense Refill  . acetaminophen (TYLENOL) 325 MG tablet Take 650 mg by mouth every 6 (six) hours as needed.    Marland Kitchen albuterol (PROAIR HFA) 108 (90 Base) MCG/ACT inhaler Inhale 1 puff into the lungs every 6 (six) hours as needed for wheezing or shortness of breath (or with excersize). 18 g 0  . Cyanocobalamin (VITAMIN B-12) 1000 MCG SUBL Place 1 tablet under the tongue daily.     Marland Kitchen desloratadine (CLARINEX) 5 MG tablet Take 1 tablet by mouth once daily 90 tablet 1  . fish oil-omega-3 fatty acids 1000 MG capsule Take 1 capsule by mouth daily.     . fluticasone (FLONASE) 50 MCG/ACT nasal spray Use 2 spray(s) in each nostril once daily 48 g 1  . lansoprazole (PREVACID) 30 MG capsule Take 1 capsule by mouth once daily 90 capsule 1  . latanoprost (XALATAN) 0.005 % ophthalmic solution     . metoprolol succinate (TOPROL-XL) 25 MG 24 hr tablet Take 1 tablet (25 mg total) by mouth daily. 90 tablet 1  . selenium 50 MCG TABS tablet Take 200 mcg by mouth daily. Patient states that she is takin g 200 mcg.    . traMADol (ULTRAM) 50 MG tablet Take 1 tablet (50 mg total) by mouth every 12 (twelve) hours as needed. 180  tablet 1   No facility-administered medications prior to visit.    ROS Review of Systems  Constitutional: Negative.  Negative for diaphoresis, fatigue and unexpected weight change.  HENT: Negative.   Eyes: Negative for visual disturbance.  Respiratory: Negative for cough, chest tightness, shortness of breath and wheezing.   Cardiovascular: Negative for chest pain, palpitations and leg swelling.  Gastrointestinal: Negative for abdominal pain, constipation, diarrhea, nausea and vomiting.  Endocrine: Negative.   Genitourinary: Negative.  Negative for difficulty urinating and hematuria.  Musculoskeletal: Positive for arthralgias. Negative for myalgias and neck pain.  Skin: Negative.  Negative for color change.  Neurological: Negative.  Negative for dizziness, weakness and light-headedness.  Hematological: Negative for adenopathy. Does not bruise/bleed easily.  Psychiatric/Behavioral: Negative.     Objective:  BP 138/68 (BP Location: Left Arm, Patient Position: Sitting, Cuff Size: Normal)   Pulse 80   Temp 98.3 F (36.8 C) (Oral)   Resp 16   Ht 5\' 1"  (1.549 m)   Wt 139 lb (63 kg)   SpO2 96%   BMI 26.26 kg/m   BP Readings from Last 3 Encounters:  09/17/19 138/68  07/21/19 (!) 146/70  01/23/18 140/70    Wt Readings from Last 3 Encounters:  09/17/19 139 lb (63 kg)  07/21/19 141 lb (64 kg)  01/23/18 133 lb (60.3 kg)    Physical Exam Vitals  reviewed.  Constitutional:      Appearance: Normal appearance.  HENT:     Nose: Nose normal.     Mouth/Throat:     Mouth: Mucous membranes are moist.  Eyes:     General: No scleral icterus.    Conjunctiva/sclera: Conjunctivae normal.  Cardiovascular:     Rate and Rhythm: Normal rate and regular rhythm.     Heart sounds: No murmur.  Pulmonary:     Effort: Pulmonary effort is normal.     Breath sounds: No stridor. No wheezing, rhonchi or rales.  Abdominal:     General: Abdomen is flat.     Palpations: There is no mass.      Tenderness: There is no abdominal tenderness. There is no guarding.  Musculoskeletal:        General: Tenderness present. No swelling. Normal range of motion.     Cervical back: Neck supple.     Right lower leg: No edema.     Left lower leg: No edema.  Lymphadenopathy:     Cervical: No cervical adenopathy.  Skin:    General: Skin is warm and dry.  Neurological:     General: No focal deficit present.     Mental Status: She is alert and oriented to person, place, and time. Mental status is at baseline.     Lab Results  Component Value Date   WBC 6.7 07/21/2019   HGB 13.1 07/21/2019   HCT 38.7 07/21/2019   PLT 214.0 07/21/2019   GLUCOSE 96 07/21/2019   CHOL 211 (H) 07/21/2019   TRIG 76.0 07/21/2019   HDL 66.60 07/21/2019   LDLDIRECT 144.4 10/30/2012   LDLCALC 129 (H) 07/21/2019   ALT 9 07/21/2019   AST 18 07/21/2019   NA 136 07/21/2019   K 3.6 07/21/2019   CL 101 07/21/2019   CREATININE 0.83 07/21/2019   BUN 13 07/21/2019   CO2 31 07/21/2019   TSH 1.23 07/21/2019   HGBA1C 6.1 07/21/2019   MICROALBUR <0.7 02/20/2017    CT Abdomen Pelvis Wo Contrast  Result Date: 11/09/2015 CLINICAL DATA:  Left upper quadrant abdominal pain. One episode of vomiting last night. EXAM: CT ABDOMEN AND PELVIS WITHOUT CONTRAST TECHNIQUE: Multidetector CT imaging of the abdomen and pelvis was performed following the standard protocol without IV contrast. COMPARISON:  06/10/2007. FINDINGS: Lower chest:  Mild bilateral dependent atelectasis/ scarring. Hepatobiliary: Little change in previously demonstrated liver cysts. Normal appearing gallbladder. Pancreas: No mass or inflammatory process identified on this un-enhanced exam. Spleen: Within normal limits in size and appearance. Adrenals/Urinary Tract: Normal appearing adrenal glands, kidneys, ureters and urinary bladder. No calculi or hydronephrosis. No visible mass. Stomach/Bowel: Mild sigmoid colon diverticulosis. No gastric or small bowel  abnormalities. Surgically absent appendix. Vascular/Lymphatic: No pathologically enlarged lymph nodes. No evidence of abdominal aortic aneurysm. Reproductive: Small calcified uterine fibroid.  No adnexal mass. Other: None. Musculoskeletal: Lumbar and lower thoracic spine degenerative changes. IMPRESSION: No acute abnormality. Electronically Signed   By: Beckie Salts M.D.   On: 11/09/2015 09:38   DG Chest 2 View  Result Date: 11/09/2015 CLINICAL DATA:  86 year old female with a history of left upper quadrant pain EXAM: CHEST  2 VIEW COMPARISON:  04/02/2013, 12/01/2007 FINDINGS: Cardiomediastinal silhouette unchanged in size and contour. No confluent airspace disease, pneumothorax, or pleural effusion. Unremarkable appearance of the upper abdomen. IMPRESSION: No radiographic evidence of acute cardiopulmonary disease, background chronic changes. Aortic atherosclerosis. Signed, Yvone Neu. Loreta Ave, DO [facility] Electronically Signed  By: Gilmer MorJaime  Wagner D.O.   On: 11/09/2015 09:31   ECHOCARDIOGRAM COMPLETE  Result Date: 11/09/2015                            Tressie Ellis**                  *[facility]*                          [address]                        [place], [place] [address]                            [phone] ------------------------------------------------------------------- Transthoracic Echocardiography Patient:    Brianna ColtBrockington, Octivia M MR #:       [id number] Study Date: 11/09/2015 Gender:     F Age:        [age] Height:     154.9 cm Weight:     64.4 kg BSA:        1.68 m^2 Pt. Status: Room:       1415  ADMITTING    Hartley BarefootRegalado, Belkys A  ORDERING     Zoila ShutterKenneth Hilty MD  REFERRING    Zoila ShutterKenneth Hilty MD  ATTENDING    Tilden FossaRees, Elizabeth [phone]  PERFORMING   [facility], Inpatient  SONOGRAPHER  Leta Junglingiffany Cooper, RDCS cc: ------------------------------------------------------------------- LV EF: 60% -   65%  ------------------------------------------------------------------- Indications:     Abnormal blood test 790.99. Elevated Troponin. Chest pain 786.51. ------------------------------------------------------------------- History:   PMH:  Gastroenteritis.  Risk factors:  Hypertension. Diabetes mellitus. Dyslipidemia. ------------------------------------------------------------------- Study Conclusions - Left ventricle: The cavity size was normal. Wall thickness was   normal. Systolic function was normal. The estimated ejection   fraction was in the range of 60% to 65%. Wall motion was normal;   there were no regional wall motion abnormalities. Doppler   parameters are consistent with abnormal left ventricular   relaxation (grade 1 diastolic dysfunction). ------------------------------------------------------------------- Study data:  No prior study was available for comparison.  Study status:  Routine.  Procedure:  The patient reported no pain pre or post test. Transthoracic echocardiography. Image quality was adequate.  Study completion:  There were no complications. Transthoracic echocardiography.  M-mode, complete 2D, spectral Doppler, and color Doppler.  Birthdate:  Patient birthdate: [birth year].  Age:  Patient is 86 yr old.  Sex:  Gender: female. BMI: 26.8 kg/m^2.  Blood pressure:     117/61  Patient status: Inpatient.  Study date:  Study date: 11/09/2015. Study time: 01:48 PM.  Location:  Emergency department. ------------------------------------------------------------------- ------------------------------------------------------------------- Left ventricle:  The cavity size was normal. Wall thickness was normal. Systolic function was normal. The estimated ejection fraction was in the range of 60% to 65%. Wall motion was normal; there were no regional wall motion abnormalities. Doppler parameters are consistent with abnormal left ventricular relaxation (grade 1 diastolic dysfunction).  ------------------------------------------------------------------- Aortic valve:   Structurally normal valve.   Cusp separation was normal.  Doppler:  Transvalvular velocity was within the normal range. There was no stenosis. There was no regurgitation. ------------------------------------------------------------------- Aorta:  Aortic root: The aortic root was normal in size. Ascending aorta: The ascending aorta was normal in size. ------------------------------------------------------------------- Mitral valve:   Mildly thickened leaflets .  Doppler:  There was trivial regurgitation.  Peak gradient (D): 5 mm Hg. ------------------------------------------------------------------- Left atrium:  The atrium was normal in size. ------------------------------------------------------------------- Right ventricle:  The cavity size was normal. Systolic function was normal. ------------------------------------------------------------------- Pulmonic valve:    The valve appears to be grossly normal. Doppler:  There was trivial regurgitation. ------------------------------------------------------------------- Tricuspid valve:   Structurally normal valve.   Leaflet separation was normal.  Doppler:  Transvalvular velocity was within the normal range. There was mild regurgitation. ------------------------------------------------------------------- Pulmonary artery:   Systolic pressure was at the upper limits of normal. ------------------------------------------------------------------- Right atrium:  The atrium was normal in size. ------------------------------------------------------------------- Pericardium:  There was no pericardial effusion. ------------------------------------------------------------------- Measurements  Left ventricle                         Value        Reference  LV ID, ED, PLAX chordal        (L)     32.9  mm     43 - 52  LV ID, ES, PLAX chordal                23    mm     23 - 38  LV fx shortening,  PLAX chordal         30    %      >=29  LV PW thickness, ED                    6.79  mm     ---------  IVS/LV PW ratio, ED            (H)     1.74         <=1.3  Stroke volume, 2D                      55    ml     ---------  Stroke volume/bsa, 2D                  33    ml/m^2 ---------  LV e&', lateral                         6.96  cm/s   ---------  LV E/e&', lateral                       16.38        ---------  LV e&', medial                          7.62  cm/s   ---------  LV E/e&', medial                        14.96        ---------  LV e&', average                         7.29  cm/s   ---------  LV E/e&', average                       15.64        ---------   Ventricular septum                     Value        Reference  IVS thickness, ED                      11.8  mm     ---------   LVOT                                   Value        Reference  LVOT ID, S                             16    mm     ---------  LVOT area                              2.01  cm^2   ---------  LVOT peak velocity, S                  136   cm/s   ---------  LVOT mean velocity, S                  88.7  cm/s   ---------  LVOT VTI, S                            27.5  cm     ---------  LVOT peak gradient, S                  7     mm Hg  ---------   Aorta                                  Value        Reference  Aortic root ID, ED                     30    mm     ---------   Left atrium                            Value        Reference  LA ID, A-P, ES                         22    mm     ---------  LA ID/bsa, A-P                         1.31  cm/m^2 <=2.2  LA volume, S                           25.2  ml     ---------  LA volume/bsa, S                       15    ml/m^2 ---------  LA volume, ES, 1-p A4C                 18.8  ml     ---------  LA volume/bsa, ES, 1-p A4C             11.2  ml/m^2 ---------  LA  volume, ES, 1-p A2C                 30.6  ml     ---------  LA volume/bsa, ES, 1-p A2C             18.2  ml/m^2 ---------   Mitral valve                            Value        Reference  Mitral E-wave peak velocity            114   cm/s   ---------  Mitral A-wave peak velocity            152   cm/s   ---------  Mitral deceleration time       (H)     246   ms     150 - 230  Mitral peak gradient, D                5     mm Hg  ---------  Mitral E/A ratio, peak                 0.8          ---------   Pulmonary arteries                     Value        Reference  PA pressure, S, DP                     30    mm Hg  <=30   Tricuspid valve                        Value        Reference  Tricuspid regurg peak velocity         258   cm/s   ---------  Tricuspid peak RV-RA gradient          27    mm Hg  ---------   Systemic veins                         Value        Reference  Estimated CVP                          3     mm Hg  ---------   Right ventricle                        Value        Reference  TAPSE                                  17.5  mm     ---------  RV pressure, S, DP                     30    mm Hg  <=30  RV s&', lateral, S                      9.68  cm/s   --------- Legend: (L)  and  (H)  mark values outside specified reference range. ------------------------------------------------------------------- Prepared  and Electronically Authenticated by Mertie Moores, M.D. 2017-07-11T16:15:10  VAS [place] LOWER EXTREMITY VENOUS (DVT)  Result Date: 11/10/2015                     *[facility]*                          [address]                        [place], [place] [address]                            [phone] ------------------------------------------------------------------- Noninvasive Vascular Lab Bilateral Lower Extremity Venous Duplex Evaluation Patient:    Britainy, Kozub MR #:       [id number] Study Date: 11/09/2015 Gender:     F Age:        69 Height: Weight: BSA: Pt. Status: Room:       [place], Belkys A  ORDERING     Regalado, Belkys A  REFERRING    Regalado,  Belkys A  ATTENDING    Lala Lund K  SONOGRAPHER  Janifer Adie, RVT, RDMS Reports also to: ------------------------------------------------------------------- History and indications: Indications 729.5 Pain in limb.  History Diagnostic evaluation. ------------------------------------------------------------------- Study information: Study status:  Routine.  Procedure:  A vascular evaluation was performed with the patient in the supine position. The right common femoral, right femoral, right profunda femoral, right popliteal, right peroneal, right posterior tibial, left common femoral, left femoral, left profunda femoral, left popliteal, left peroneal, and left posterior tibial veins were studied. Image quality was good.  Bilateral lower extremity venous duplex evaluation.     Doppler flow study including B-mode compression maneuvers of all visualized segments, color flow Doppler and selected views of pulsed wave Doppler.  Birthdate:  Patient birthdate: 09/10/1932.  Age:  Patient is 86 yr old.  Sex:  Gender: female.  Study date:  Study date: 11/09/2015. Study time: 03:15 PM.  Location:  Bedside.  Patient status:  Inpatient. Venous flow: +--------------------------+-------+------------------------------+ Location                  OverallFlow properties                +--------------------------+-------+------------------------------+ Right common femoral      Patent Phasic; spontaneous;                                            compressible                   +--------------------------+-------+------------------------------+ Right femoral             Patent Compressible                   +--------------------------+-------+------------------------------+ Right profunda femoral    Patent Compressible                   +--------------------------+-------+------------------------------+ Right popliteal           Patent Phasic; spontaneous;  compressible                   +--------------------------+-------+------------------------------+ Right posterior tibial    Patent Compressible                   +--------------------------+-------+------------------------------+ Right peroneal            Patent Compressible                   +--------------------------+-------+------------------------------+ Right saphenofemoral      Patent Compressible                   junction                                                        +--------------------------+-------+------------------------------+ Left common femoral       Patent Phasic; spontaneous;                                            compressible                   +--------------------------+-------+------------------------------+ Left femoral              Patent Compressible                   +--------------------------+-------+------------------------------+ Left profunda femoral     Patent Compressible                   +--------------------------+-------+------------------------------+ Left popliteal            Patent Phasic; spontaneous;                                            compressible                   +--------------------------+-------+------------------------------+ Left posterior tibial     Patent Compressible                   +--------------------------+-------+------------------------------+ Left peroneal             Patent Compressible                   +--------------------------+-------+------------------------------+ Left saphenofemoral       Patent Compressible                   junction                                                        +--------------------------+-------+------------------------------+ ------------------------------------------------------------------- Summary: - No evidence of deep vein or superficial thrombosis involving the   right lower extremity and left lower extremity. - No evidence of  Baker&'s cyst on the right or left. Other specific details can be found in the table(s) above. Prepared and Electronically Authenticated by Gretta Began, MD 2017-07-12T15:28:04   Assessment & Plan:   Carletha was seen today for hypertension and osteoarthritis.  Diagnoses and all orders for this visit:  Essential hypertension, benign- Her blood pressure is adequately well controlled.  Primary osteoarthritis involving multiple joints- She will continue taking Tylenol as needed.  I also think it would be safe and effective for her to take nabumetone. -     nabumetone (RELAFEN) 500 MG tablet; Take 1 tablet (500 mg total) by mouth 2 (two) times daily as needed.   I have discontinued Arnita M. Achille's traMADol. I am also having her start on nabumetone. Additionally, I am having her maintain her fish oil-omega-3 fatty acids, Vitamin B-12, selenium, albuterol, latanoprost, acetaminophen, fluticasone, desloratadine, lansoprazole, and metoprolol succinate.  Meds ordered this encounter  Medications  . nabumetone (RELAFEN) 500 MG tablet    Sig: Take 1 tablet (500 mg total) by mouth 2 (two) times daily as needed.    Dispense:  180 tablet    Refill:  1     Follow-up: Return in about 6 months (around 03/19/2020).  Sanda Linger, MD

## 2019-09-18 ENCOUNTER — Telehealth: Payer: Self-pay | Admitting: Emergency Medicine

## 2019-09-18 NOTE — Telephone Encounter (Signed)
[facility] pharmacy called about a drug allergy for the medication nabumetone (RELAFEN) 500 MG tablet. Pt is allergic to aspirin. Please give them a call back thanks.

## 2019-09-22 NOTE — Telephone Encounter (Signed)
Tried to call pt on home number - vm not set up to leave a message.   Tried to call pt on mobile number - lvm for pt to call back.   RE: Please ask patient to confirm the type of reaction she had to aspirin. On her allergy list it states "shortness of breath".

## 2019-09-23 NOTE — Telephone Encounter (Signed)
F/u  Type of reaction to aspirin--- is shortness of breath  The patient voiced she does not want to take anything that would affect her heart.   Please advise.

## 2019-09-24 NOTE — Telephone Encounter (Signed)
Tried to call pt, not able to lvm.   Called pharmacy and requested that they dx the nabumetone 500 mg tablet

## 2019-10-02 ENCOUNTER — Other Ambulatory Visit: Payer: Self-pay | Admitting: Internal Medicine

## 2020-03-17 ENCOUNTER — Telehealth: Payer: Self-pay | Admitting: Internal Medicine

## 2020-03-17 NOTE — Telephone Encounter (Signed)
LVM for pt to rtn my call to schedule AWV with [facility]. Please schedule appt if pt calls the office.   Thanks, [phone]

## 2020-03-22 ENCOUNTER — Ambulatory Visit (INDEPENDENT_AMBULATORY_CARE_PROVIDER_SITE_OTHER): Payer: Medicare Other | Admitting: Internal Medicine

## 2020-03-22 ENCOUNTER — Encounter: Payer: Self-pay | Admitting: Internal Medicine

## 2020-03-22 ENCOUNTER — Other Ambulatory Visit: Payer: Self-pay

## 2020-03-22 VITALS — BP 122/74 | HR 62 | Temp 98.4°F | Ht 61.0 in | Wt 133.0 lb

## 2020-03-22 DIAGNOSIS — I10 Essential (primary) hypertension: Secondary | ICD-10-CM | POA: Diagnosis not present

## 2020-03-22 DIAGNOSIS — R7303 Prediabetes: Secondary | ICD-10-CM

## 2020-03-22 DIAGNOSIS — Z23 Encounter for immunization: Secondary | ICD-10-CM

## 2020-03-22 DIAGNOSIS — M8949 Other hypertrophic osteoarthropathy, multiple sites: Secondary | ICD-10-CM

## 2020-03-22 DIAGNOSIS — M159 Polyosteoarthritis, unspecified: Secondary | ICD-10-CM

## 2020-03-22 LAB — BASIC METABOLIC PANEL
BUN: 17 mg/dL (ref 6–23)
CO2: 32 mEq/L (ref 19–32)
Calcium: 9.9 mg/dL (ref 8.4–10.5)
Chloride: 102 mEq/L (ref 96–112)
Creatinine, Ser: 0.83 mg/dL (ref 0.40–1.20)
GFR: 64.25 mL/min (ref >60.00)
Glucose, Bld: 97 mg/dL (ref 70–99)
Potassium: 4.7 mEq/L (ref 3.5–5.1)
Sodium: 139 mEq/L (ref 135–145)

## 2020-03-22 LAB — HEMOGLOBIN A1C: Hgb A1c MFr Bld: 5.8 % (ref 4.6–6.5)

## 2020-03-22 MED ORDER — TRAMADOL HCL 50 MG PO TABS: 50.0000 mg | ORAL_TABLET | Freq: Four times a day (QID) | ORAL | 5 refills | Status: DC | PRN

## 2020-03-22 NOTE — Progress Notes (Signed)
Subjective:  Patient ID: Brianna Mcdonald, female    DOB: [birth year]  Age: 86 y.o. MRN: [id number]  CC: Osteoarthritis and Hypertension  This visit occurred during the SARS-CoV-2 public health emergency.  Safety protocols were in place, including screening questions prior to the visit, additional usage of staff PPE, and extensive cleaning of exam room while observing appropriate contact time as indicated for disinfecting solutions.    HPI ALICESON DOLBOW presents for f/up - She continues to complain of pain in her large joints.  She is gotten minimal symptom relief with Tylenol.  The pain interferes with her sleep and activities of daily living.  She is active and denies any recent episodes of chest pain, shortness of breath, palpitations, edema, or fatigue.  Outpatient Medications Prior to Visit  Medication Sig Dispense Refill  . acetaminophen (TYLENOL) 325 MG tablet Take 650 mg by mouth every 6 (six) hours as needed.    Marland Kitchen albuterol (PROAIR HFA) 108 (90 Base) MCG/ACT inhaler Inhale 1 puff into the lungs every 6 (six) hours as needed for wheezing or shortness of breath (or with excersize). 18 g 0  . Cyanocobalamin (VITAMIN B-12) 1000 MCG SUBL Place 1 tablet under the tongue daily.     Marland Kitchen desloratadine (CLARINEX) 5 MG tablet Take 1 tablet by mouth once daily 90 tablet 1  . fish oil-omega-3 fatty acids 1000 MG capsule Take 1 capsule by mouth daily.     . fluticasone (FLONASE) 50 MCG/ACT nasal spray Use 2 spray(s) in each nostril once daily 48 g 1  . lansoprazole (PREVACID) 30 MG capsule Take 1 capsule by mouth once daily 90 capsule 1  . latanoprost (XALATAN) 0.005 % ophthalmic solution     . metoprolol succinate (TOPROL-XL) 25 MG 24 hr tablet Take 1 tablet (25 mg total) by mouth daily. 90 tablet 1  . selenium 50 MCG TABS tablet Take 200 mcg by mouth daily. Patient states that she is takin g 200 mcg.     No facility-administered medications prior to visit.    ROS Review of Systems    Constitutional: Negative for diaphoresis and fatigue.  HENT: Negative.   Eyes: Negative.   Respiratory: Negative for cough, chest tightness, shortness of breath and wheezing.   Cardiovascular: Negative for chest pain, palpitations and leg swelling.  Gastrointestinal: Negative for abdominal pain, constipation, diarrhea, nausea and vomiting.  Endocrine: Negative.  Negative for polyuria.  Genitourinary: Negative.  Negative for difficulty urinating.  Musculoskeletal: Positive for arthralgias. Negative for myalgias.  Skin: Negative.  Negative for color change and pallor.  Neurological: Negative.  Negative for weakness and light-headedness.  Hematological: Negative for adenopathy. Does not bruise/bleed easily.  Psychiatric/Behavioral: Negative.     Objective:  BP 122/74   Pulse 62   Temp 98.4 F (36.9 C) (Oral)   Ht 5\' 1"  (1.549 m)   Wt 133 lb (60.3 kg)   SpO2 97%   BMI 25.13 kg/m   BP Readings from Last 3 Encounters:  03/22/20 122/74  09/17/19 138/68  07/21/19 (!) 146/70    Wt Readings from Last 3 Encounters:  03/22/20 133 lb (60.3 kg)  09/17/19 139 lb (63 kg)  07/21/19 141 lb (64 kg)    Physical Exam Vitals reviewed.  HENT:     Nose: Nose normal.     Mouth/Throat:     Mouth: Mucous membranes are moist.  Eyes:     General: No scleral icterus.    Conjunctiva/sclera: Conjunctivae normal.  Cardiovascular:  Rate and Rhythm: Normal rate and regular rhythm.     Heart sounds: No murmur heard.   Pulmonary:     Effort: Pulmonary effort is normal.     Breath sounds: No stridor. No wheezing, rhonchi or rales.  Abdominal:     General: Abdomen is flat. Bowel sounds are normal. There is no distension.     Palpations: Abdomen is soft. There is no hepatomegaly, splenomegaly or mass.     Tenderness: There is no abdominal tenderness.  Musculoskeletal:        General: Deformity (DJD) present. No swelling or tenderness. Normal range of motion.     Cervical back: Neck supple.      Right lower leg: No edema.     Left lower leg: No edema.  Lymphadenopathy:     Cervical: No cervical adenopathy.  Skin:    General: Skin is warm and dry.  Neurological:     General: No focal deficit present.     Mental Status: She is alert.     Lab Results  Component Value Date   WBC 6.7 07/21/2019   HGB 13.1 07/21/2019   HCT 38.7 07/21/2019   PLT 214.0 07/21/2019   GLUCOSE 97 03/22/2020   CHOL 211 (H) 07/21/2019   TRIG 76.0 07/21/2019   HDL 66.60 07/21/2019   LDLDIRECT 144.4 10/30/2012   LDLCALC 129 (H) 07/21/2019   ALT 9 07/21/2019   AST 18 07/21/2019   NA 139 03/22/2020   K 4.7 03/22/2020   CL 102 03/22/2020   CREATININE 0.83 03/22/2020   BUN 17 03/22/2020   CO2 32 03/22/2020   TSH 1.23 07/21/2019   HGBA1C 5.8 03/22/2020   MICROALBUR <0.7 02/20/2017    CT Abdomen Pelvis Wo Contrast  Result Date: 11/09/2015 CLINICAL DATA:  Left upper quadrant abdominal pain. One episode of vomiting last night. EXAM: CT ABDOMEN AND PELVIS WITHOUT CONTRAST TECHNIQUE: Multidetector CT imaging of the abdomen and pelvis was performed following the standard protocol without IV contrast. COMPARISON:  06/10/2007. FINDINGS: Lower chest:  Mild bilateral dependent atelectasis/ scarring. Hepatobiliary: Little change in previously demonstrated liver cysts. Normal appearing gallbladder. Pancreas: No mass or inflammatory process identified on this un-enhanced exam. Spleen: Within normal limits in size and appearance. Adrenals/Urinary Tract: Normal appearing adrenal glands, kidneys, ureters and urinary bladder. No calculi or hydronephrosis. No visible mass. Stomach/Bowel: Mild sigmoid colon diverticulosis. No gastric or small bowel abnormalities. Surgically absent appendix. Vascular/Lymphatic: No pathologically enlarged lymph nodes. No evidence of abdominal aortic aneurysm. Reproductive: Small calcified uterine fibroid.  No adnexal mass. Other: None. Musculoskeletal: Lumbar and lower thoracic spine  degenerative changes. IMPRESSION: No acute abnormality. Electronically Signed   By: Beckie SaltsSteven  Reid M.D.   On: 11/09/2015 09:38   DG Chest 2 View  Result Date: 11/09/2015 CLINICAL DATA:  86 year old female with a history of left upper quadrant pain EXAM: CHEST  2 VIEW COMPARISON:  04/02/2013, 12/01/2007 FINDINGS: Cardiomediastinal silhouette unchanged in size and contour. No confluent airspace disease, pneumothorax, or pleural effusion. Unremarkable appearance of the upper abdomen. IMPRESSION: No radiographic evidence of acute cardiopulmonary disease, background chronic changes. Aortic atherosclerosis. Signed, Yvone NeuJaime S. Loreta AveWagner, DO [facility] Electronically Signed   By: Gilmer MorJaime  Wagner D.O.   On: 11/09/2015 09:31   ECHOCARDIOGRAM COMPLETE  Result Date: 11/09/2015                            [facility]*[place]*                  *  [facility]*                          [address]                        [place], [place] [address]                            [phone] ------------------------------------------------------------------- Transthoracic Echocardiography Patient:    Ailene, Royal MR #:       [id number] Study Date: 11/09/2015 Gender:     F Age:        48 Height:     154.9 cm Weight:     64.4 kg BSA:        1.68 m^2 Pt. Status: Room:       1415  ADMITTING    Hartley Barefoot A  ORDERING     Zoila Shutter MD  REFERRING    Zoila Shutter MD  ATTENDING    Tilden Fossa [id number]  PERFORMING   [facility], Inpatient  SONOGRAPHER  Leta Jungling, RDCS cc: ------------------------------------------------------------------- LV EF: 60% -   65% ------------------------------------------------------------------- Indications:     Abnormal blood test 790.99. Elevated Troponin. Chest pain 786.51. ------------------------------------------------------------------- History:   PMH:  Gastroenteritis.  Risk factors:  Hypertension. Diabetes mellitus.  Dyslipidemia. ------------------------------------------------------------------- Study Conclusions - Left ventricle: The cavity size was normal. Wall thickness was   normal. Systolic function was normal. The estimated ejection   fraction was in the range of 60% to 65%. Wall motion was normal;   there were no regional wall motion abnormalities. Doppler   parameters are consistent with abnormal left ventricular   relaxation (grade 1 diastolic dysfunction). ------------------------------------------------------------------- Study data:  No prior study was available for comparison.  Study status:  Routine.  Procedure:  The patient reported no pain pre or post test. Transthoracic echocardiography. Image quality was adequate.  Study completion:  There were no complications. Transthoracic echocardiography.  M-mode, complete 2D, spectral Doppler, and color Doppler.  Birthdate:  Patient birthdate: 1932/11/23.  Age:  Patient is 86 yr old.  Sex:  Gender: female. BMI: 26.8 kg/m^2.  Blood pressure:     117/61  Patient status: Inpatient.  Study date:  Study date: 11/09/2015. Study time: 01:48 PM.  Location:  Emergency department. ------------------------------------------------------------------- ------------------------------------------------------------------- Left ventricle:  The cavity size was normal. Wall thickness was normal. Systolic function was normal. The estimated ejection fraction was in the range of 60% to 65%. Wall motion was normal; there were no regional wall motion abnormalities. Doppler parameters are consistent with abnormal left ventricular relaxation (grade 1 diastolic dysfunction). ------------------------------------------------------------------- Aortic valve:   Structurally normal valve.   Cusp separation was normal.  Doppler:  Transvalvular velocity was within the normal range. There was no stenosis. There was no regurgitation. ------------------------------------------------------------------- Aorta:   Aortic root: The aortic root was normal in size. Ascending aorta: The ascending aorta was normal in size. ------------------------------------------------------------------- Mitral valve:   Mildly thickened leaflets .  Doppler:  There was trivial regurgitation.    Peak gradient (D): 5 mm Hg. ------------------------------------------------------------------- Left atrium:  The atrium was normal in size. ------------------------------------------------------------------- Right ventricle:  The cavity size was normal. Systolic function was normal. ------------------------------------------------------------------- Pulmonic valve:    The valve appears to be grossly normal. Doppler:  There was trivial regurgitation. ------------------------------------------------------------------- Tricuspid valve:   Structurally normal valve.   Leaflet separation was normal.  Doppler:  Transvalvular velocity was within the normal range. There was mild regurgitation. ------------------------------------------------------------------- Pulmonary artery:   Systolic pressure was at the upper limits of normal. ------------------------------------------------------------------- Right atrium:  The atrium was normal in size. ------------------------------------------------------------------- Pericardium:  There was no pericardial effusion. ------------------------------------------------------------------- Measurements  Left ventricle                         Value        Reference  LV ID, ED, PLAX chordal        (L)     32.9  mm     43 - 52  LV ID, ES, PLAX chordal                23    mm     23 - 38  LV fx shortening, PLAX chordal         30    %      >=29  LV PW thickness, ED                    6.79  mm     ---------  IVS/LV PW ratio, ED            (H)     1.74         <=1.3  Stroke volume, 2D                      55    ml     ---------  Stroke volume/bsa, 2D                  33    ml/m^2 ---------  LV e&', lateral                          6.96  cm/s   ---------  LV E/e&', lateral                       16.38        ---------  LV e&', medial                          7.62  cm/s   ---------  LV E/e&', medial                        14.96        ---------  LV e&', average                         7.29  cm/s   ---------  LV E/e&', average                       15.64        ---------   Ventricular septum                     Value        Reference  IVS thickness, ED                      11.8  mm     ---------   LVOT  Value        Reference  LVOT ID, S                             16    mm     ---------  LVOT area                              2.01  cm^2   ---------  LVOT peak velocity, S                  136   cm/s   ---------  LVOT mean velocity, S                  88.7  cm/s   ---------  LVOT VTI, S                            27.5  cm     ---------  LVOT peak gradient, S                  7     mm Hg  ---------   Aorta                                  Value        Reference  Aortic root ID, ED                     30    mm     ---------   Left atrium                            Value        Reference  LA ID, A-P, ES                         22    mm     ---------  LA ID/bsa, A-P                         1.31  cm/m^2 <=2.2  LA volume, S                           25.2  ml     ---------  LA volume/bsa, S                       15    ml/m^2 ---------  LA volume, ES, 1-p A4C                 18.8  ml     ---------  LA volume/bsa, ES, 1-p A4C             11.2  ml/m^2 ---------  LA volume, ES, 1-p A2C                 30.6  ml     ---------  LA volume/bsa, ES, 1-p A2C             18.2  ml/m^2 ---------   Mitral valve  Value        Reference  Mitral E-wave peak velocity            114   cm/s   ---------  Mitral A-wave peak velocity            152   cm/s   ---------  Mitral deceleration time       (H)     246   ms     150 - 230  Mitral peak gradient, D                5     mm Hg  ---------  Mitral E/A ratio, peak                  0.8          ---------   Pulmonary arteries                     Value        Reference  PA pressure, S, DP                     30    mm Hg  <=30   Tricuspid valve                        Value        Reference  Tricuspid regurg peak velocity         258   cm/s   ---------  Tricuspid peak RV-RA gradient          27    mm Hg  ---------   Systemic veins                         Value        Reference  Estimated CVP                          3     mm Hg  ---------   Right ventricle                        Value        Reference  TAPSE                                  17.5  mm     ---------  RV pressure, S, DP                     30    mm Hg  <=30  RV s&', lateral, S                      9.68  cm/s   --------- Legend: (L)  and  (H)  mark values outside specified reference range. ------------------------------------------------------------------- Prepared and Electronically Authenticated by Kristeen Miss, M.D. 2017-07-11T16:15:10  VAS [place] LOWER EXTREMITY VENOUS (DVT)  Result Date: 11/10/2015                     [facility]*                  *[facility]*  [address]                        [place], [place] [address]                            [phone] ------------------------------------------------------------------- Noninvasive Vascular Lab Bilateral Lower Extremity Venous Duplex Evaluation Patient:    Marilea, Gwynne MR #:       [id number] Study Date: 11/09/2015 Gender:     F Age:        37 Height: Weight: BSA: Pt. Status: Room:       1415  ADMITTING    Regalado, Belkys A  ORDERING     Regalado, Belkys A  REFERRING    Regalado, Belkys A  ATTENDING    Susa Raring K  SONOGRAPHER  Jenetta Loges, RVT, RDMS Reports also to: ------------------------------------------------------------------- History and indications: Indications 729.5 Pain in limb.  History Diagnostic evaluation. ------------------------------------------------------------------- Study  information: Study status:  Routine.  Procedure:  A vascular evaluation was performed with the patient in the supine position. The right common femoral, right femoral, right profunda femoral, right popliteal, right peroneal, right posterior tibial, left common femoral, left femoral, left profunda femoral, left popliteal, left peroneal, and left posterior tibial veins were studied. Image quality was good.  Bilateral lower extremity venous duplex evaluation.     Doppler flow study including B-mode compression maneuvers of all visualized segments, color flow Doppler and selected views of pulsed wave Doppler.  Birthdate:  Patient birthdate: Jun 27, 1932.  Age:  Patient is 86 yr old.  Sex:  Gender: female.  Study date:  Study date: 11/09/2015. Study time: 03:15 PM.  Location:  Bedside.  Patient status:  Inpatient. Venous flow: +--------------------------+-------+------------------------------+ Location                  OverallFlow properties                +--------------------------+-------+------------------------------+ Right common femoral      Patent Phasic; spontaneous;                                            compressible                   +--------------------------+-------+------------------------------+ Right femoral             Patent Compressible                   +--------------------------+-------+------------------------------+ Right profunda femoral    Patent Compressible                   +--------------------------+-------+------------------------------+ Right popliteal           Patent Phasic; spontaneous;                                            compressible                   +--------------------------+-------+------------------------------+ Right posterior tibial    Patent Compressible                   +--------------------------+-------+------------------------------+ Right peroneal            Patent Compressible                    +--------------------------+-------+------------------------------+  Right saphenofemoral      Patent Compressible                   junction                                                        +--------------------------+-------+------------------------------+ Left common femoral       Patent Phasic; spontaneous;                                            compressible                   +--------------------------+-------+------------------------------+ Left femoral              Patent Compressible                   +--------------------------+-------+------------------------------+ Left profunda femoral     Patent Compressible                   +--------------------------+-------+------------------------------+ Left popliteal            Patent Phasic; spontaneous;                                            compressible                   +--------------------------+-------+------------------------------+ Left posterior tibial     Patent Compressible                   +--------------------------+-------+------------------------------+ Left peroneal             Patent Compressible                   +--------------------------+-------+------------------------------+ Left saphenofemoral       Patent Compressible                   junction                                                        +--------------------------+-------+------------------------------+ ------------------------------------------------------------------- Summary: - No evidence of deep vein or superficial thrombosis involving the   right lower extremity and left lower extremity. - No evidence of Baker&'s cyst on the right or left. Other specific details can be found in the table(s) above. Prepared and Electronically Authenticated by Gretta Began, MD 2017-07-12T15:28:04   Assessment & Plan:   Anu was seen today for osteoarthritis and hypertension.  Diagnoses and all orders for this  visit:  Essential hypertension, benign- Her blood pressure is adequately well controlled.  Electrolytes and renal function are normal. -     Basic metabolic panel; Future -     Basic metabolic panel  Primary osteoarthritis involving multiple joints -     traMADol (ULTRAM) 50 MG tablet; Take 1 tablet (50 mg total) by mouth every 6 (six) hours as needed.  Prediabetes- Her A1c is normal now. -  Basic metabolic panel; Future -     Hemoglobin A1c; Future -     Hemoglobin A1c -     Basic metabolic panel  Flu vaccine need -     Flu Vaccine QUAD High Dose(Fluad)   I am having Naiyana M. Turck start on traMADol. I am also having her maintain her fish oil-omega-3 fatty acids, Vitamin B-12, selenium, albuterol, latanoprost, acetaminophen, fluticasone, lansoprazole, metoprolol succinate, and desloratadine.  Meds ordered this encounter  Medications  . traMADol (ULTRAM) 50 MG tablet    Sig: Take 1 tablet (50 mg total) by mouth every 6 (six) hours as needed.    Dispense:  90 tablet    Refill:  5     Follow-up: Return in about 6 months (around 09/19/2020).  Sanda Linger, MD

## 2020-03-22 NOTE — Patient Instructions (Signed)

## 2020-04-03 ENCOUNTER — Other Ambulatory Visit: Payer: Self-pay | Admitting: Internal Medicine

## 2020-04-05 ENCOUNTER — Telehealth: Payer: Self-pay | Admitting: Internal Medicine

## 2020-04-05 NOTE — Telephone Encounter (Signed)
Patient called and was wondering if she could get a call back in regards to her most recent lab work. She can be reached at [phone]

## 2020-04-05 NOTE — Telephone Encounter (Signed)
Please advise.   Pt had an OV on 11/22 with lab work but I do not see any notations from you on the lab work from that day.

## 2020-04-05 NOTE — Telephone Encounter (Signed)
Pt has been informed of results and expressed understanding.  °

## 2020-04-26 ENCOUNTER — Other Ambulatory Visit: Payer: Self-pay | Admitting: Internal Medicine

## 2020-04-26 ENCOUNTER — Telehealth: Payer: Self-pay | Admitting: Internal Medicine

## 2020-04-26 DIAGNOSIS — L602 Onychogryphosis: Secondary | ICD-10-CM

## 2020-04-26 NOTE — Telephone Encounter (Signed)
Patient called and said she is needing a referral to a doctor that can help her keep her right 2nd toe nail trimmed down. She said once it grows past a certain length she said its hard for her to walk. She can be reached at [phone].

## 2020-04-28 ENCOUNTER — Encounter: Payer: Self-pay | Admitting: Podiatry

## 2020-04-28 ENCOUNTER — Other Ambulatory Visit: Payer: Self-pay

## 2020-04-28 ENCOUNTER — Ambulatory Visit (INDEPENDENT_AMBULATORY_CARE_PROVIDER_SITE_OTHER): Payer: Medicare Other | Admitting: Podiatry

## 2020-04-28 DIAGNOSIS — M79674 Pain in right toe(s): Secondary | ICD-10-CM | POA: Diagnosis not present

## 2020-04-28 DIAGNOSIS — B351 Tinea unguium: Secondary | ICD-10-CM | POA: Insufficient documentation

## 2020-04-28 NOTE — Progress Notes (Signed)
Complaint:  Visit Type: Patient returns to my office for continued preventative foot care services. Complaint: Patient states" my nails have grown long and thick on her first and second toenails right foot.  Patient says her doctor recommended she present to this office for nail care.. The patient presents for preventative foot care services.  Podiatric Exam: Vascular: dorsalis pedis  are palpable bilateral.  Posterior tibial pulses afre absent  B/L.  Capillary return is immediate. Temperature gradient is WNL. Cold feet  Absent digital hair  B/L. Sensorium: Normal Semmes Weinstein monofilament test. Normal tactile sensation bilaterally. Nail Exam: Pt has thick disfigured discolored nails with subungual debris noted first and second toe right foot. Ulcer Exam: There is no evidence of ulcer or pre-ulcerative changes or infection. Orthopedic Exam: Muscle tone and strength are WNL. No limitations in general ROM. No crepitus or effusions noted. Foot type and digits show no abnormalities. HAV  B/L. Skin: No Porokeratosis. No infection or ulcers  Diagnosis:  Onychomycosis, , Pain in right toe,   Treatment & Plan Procedures and Treatment: Consent by patient was obtained for treatment procedures.   Debridement of mycotic and hypertrophic toenail  With nail nipper followed by dremel tool usage. No ulceration, no infection noted.  Return Visit-Office Procedure: Patient instructed to return to the office for a follow up visit 3 months for continued evaluation and treatment.    Helane Gunther DPM

## 2020-05-17 ENCOUNTER — Telehealth: Payer: Self-pay | Admitting: Internal Medicine

## 2020-05-17 NOTE — Telephone Encounter (Signed)
[facility] Call/Report: Caller states she went to get her medication because she ran out yesterday, but the drug store was not open. She had to get an over the counter allergy medication. Allergy relief [facility] brand. She has a runny nose, sneezing sore throat ect. She needs to know if this is like what she takes normally. The medication is cetirizine hydro chloride tablet 10 mg antihistamine. She normally takes desloratadine 10 mg tablet. Is the one she bought a suitable substitue?  Advised see PCP within 2 weeks if the problem is on going.

## 2020-05-18 ENCOUNTER — Other Ambulatory Visit: Payer: Self-pay | Admitting: Internal Medicine

## 2020-06-11 ENCOUNTER — Ambulatory Visit (INDEPENDENT_AMBULATORY_CARE_PROVIDER_SITE_OTHER): Payer: Medicare Other

## 2020-06-11 ENCOUNTER — Other Ambulatory Visit: Payer: Self-pay

## 2020-06-11 VITALS — BP 124/60 | HR 63 | Temp 98.3°F | Ht 61.0 in | Wt 129.2 lb

## 2020-06-11 DIAGNOSIS — Z0000 Encounter for general adult medical examination without abnormal findings: Secondary | ICD-10-CM | POA: Diagnosis not present

## 2020-06-11 NOTE — Patient Instructions (Addendum)
Brianna Mcdonald , Thank you for taking time to come for your Medicare Wellness Visit. I appreciate your ongoing commitment to your health goals. Please review the following plan we discussed and let me know if I can assist you in the future.   Screening recommendations/referrals: Colonoscopy: no repeat due to age Mammogram: 11/23/2016 Bone Density: 11/28/2016 Recommended yearly ophthalmology/optometry visit for glaucoma screening and checkup Recommended yearly dental visit for hygiene and checkup  Vaccinations: Influenza vaccine: 03/22/2020 Pneumococcal vaccine: up to date Tdap vaccine: 03/04/2013; due every 10 years Shingles vaccine: never done   Covid-19: never done  Advanced directives: Please bring a copy of your health care power of attorney and living will to the office at your convenience.  Conditions/risks identified: Yes. Reviewed health maintenance screenings with patient today and relevant education, vaccines, and/or referrals were provided. Continue doing brain stimulating activities (puzzles, reading, adult coloring books, staying active) to keep memory sharp. Continue to eat heart healthy diet (full of fruits, vegetables, whole grains, lean protein, water--limit salt, fat, and sugar intake) and increase physical activity as tolerated.  Next appointment: Please schedule your next Medicare Wellness Visit with your Nurse Health Advisor in 1 year by calling [phone].  Preventive Care 40 Years and Older, Female Preventive care refers to lifestyle choices and visits with your health care provider that can promote health and wellness. What does preventive care include?  A yearly physical exam. This is also called an annual well check.  Dental exams once or twice a year.  Routine eye exams. Ask your health care provider how often you should have your eyes checked.  Personal lifestyle choices, including:  Daily care of your teeth and gums.  Regular physical  activity.  Eating a healthy diet.  Avoiding tobacco and drug use.  Limiting alcohol use.  Practicing safe sex.  Taking low-dose aspirin every day.  Taking vitamin and mineral supplements as recommended by your health care provider. What happens during an annual well check? The services and screenings done by your health care provider during your annual well check will depend on your age, overall health, lifestyle risk factors, and family history of disease. Counseling  Your health care provider may ask you questions about your:  Alcohol use.  Tobacco use.  Drug use.  Emotional well-being.  Home and relationship well-being.  Sexual activity.  Eating habits.  History of falls.  Memory and ability to understand (cognition).  Work and work Astronomer.  Reproductive health. Screening  You may have the following tests or measurements:  Height, weight, and BMI.  Blood pressure.  Lipid and cholesterol levels. These may be checked every 5 years, or more frequently if you are over 42 years old.  Skin check.  Lung cancer screening. You may have this screening every year starting at age 8 if you have a 30-pack-year history of smoking and currently smoke or have quit within the past 15 years.  Fecal occult blood test (FOBT) of the stool. You may have this test every year starting at age 47.  Flexible sigmoidoscopy or colonoscopy. You may have a sigmoidoscopy every 5 years or a colonoscopy every 10 years starting at age 72.  Hepatitis C blood test.  Hepatitis B blood test.  Sexually transmitted disease (STD) testing.  Diabetes screening. This is done by checking your blood sugar (glucose) after you have not eaten for a while (fasting). You may have this done every 1-3 years.  Bone density scan. This is done to screen for osteoporosis. You  may have this done starting at age 4.  Mammogram. This may be done every 1-2 years. Talk to your health care provider about  how often you should have regular mammograms. Talk with your health care provider about your test results, treatment options, and if necessary, the need for more tests. Vaccines  Your health care provider may recommend certain vaccines, such as:  Influenza vaccine. This is recommended every year.  Tetanus, diphtheria, and acellular pertussis (Tdap, Td) vaccine. You may need a Td booster every 10 years.  Zoster vaccine. You may need this after age 47.  Pneumococcal 13-valent conjugate (PCV13) vaccine. One dose is recommended after age 18.  Pneumococcal polysaccharide (PPSV23) vaccine. One dose is recommended after age 68. Talk to your health care provider about which screenings and vaccines you need and how often you need them. This information is not intended to replace advice given to you by your health care provider. Make sure you discuss any questions you have with your health care provider. Document Released: 05/14/2015 Document Revised: 01/05/2016 Document Reviewed: 02/16/2015 Elsevier Interactive Patient Education  2017 [place] Prevention in the Home Falls can cause injuries. They can happen to people of all ages. There are many things you can do to make your home safe and to help prevent falls. What can I do on the outside of my home?  Regularly fix the edges of walkways and driveways and fix any cracks.  Remove anything that might make you trip as you walk through a door, such as a raised step or threshold.  Trim any bushes or trees on the path to your home.  Use bright outdoor lighting.  Clear any walking paths of anything that might make someone trip, such as rocks or tools.  Regularly check to see if handrails are loose or broken. Make sure that both sides of any steps have handrails.  Any raised decks and porches should have guardrails on the edges.  Have any leaves, snow, or ice cleared regularly.  Use sand or salt on walking paths during  winter.  Clean up any spills in your garage right away. This includes oil or grease spills. What can I do in the bathroom?  Use night lights.  Install grab bars by the toilet and in the tub and shower. Do not use towel bars as grab bars.  Use non-skid mats or decals in the tub or shower.  If you need to sit down in the shower, use a plastic, non-slip stool.  Keep the floor dry. Clean up any water that spills on the floor as soon as it happens.  Remove soap buildup in the tub or shower regularly.  Attach bath mats securely with double-sided non-slip rug tape.  Do not have throw rugs and other things on the floor that can make you trip. What can I do in the bedroom?  Use night lights.  Make sure that you have a light by your bed that is easy to reach.  Do not use any sheets or blankets that are too big for your bed. They should not hang down onto the floor.  Have a firm chair that has side arms. You can use this for support while you get dressed.  Do not have throw rugs and other things on the floor that can make you trip. What can I do in the kitchen?  Clean up any spills right away.  Avoid walking on wet floors.  Keep items that you use a lot  in easy-to-reach places.  If you need to reach something above you, use a strong step stool that has a grab bar.  Keep electrical cords out of the way.  Do not use floor polish or wax that makes floors slippery. If you must use wax, use non-skid floor wax.  Do not have throw rugs and other things on the floor that can make you trip. What can I do with my stairs?  Do not leave any items on the stairs.  Make sure that there are handrails on both sides of the stairs and use them. Fix handrails that are broken or loose. Make sure that handrails are as long as the stairways.  Check any carpeting to make sure that it is firmly attached to the stairs. Fix any carpet that is loose or worn.  Avoid having throw rugs at the top or  bottom of the stairs. If you do have throw rugs, attach them to the floor with carpet tape.  Make sure that you have a light switch at the top of the stairs and the bottom of the stairs. If you do not have them, ask someone to add them for you. What else can I do to help prevent falls?  Wear shoes that:  Do not have high heels.  Have rubber bottoms.  Are comfortable and fit you well.  Are closed at the toe. Do not wear sandals.  If you use a stepladder:  Make sure that it is fully opened. Do not climb a closed stepladder.  Make sure that both sides of the stepladder are locked into place.  Ask someone to hold it for you, if possible.  Clearly mark and make sure that you can see:  Any grab bars or handrails.  First and last steps.  Where the edge of each step is.  Use tools that help you move around (mobility aids) if they are needed. These include:  Canes.  Walkers.  Scooters.  Crutches.  Turn on the lights when you go into a dark area. Replace any light bulbs as soon as they burn out.  Set up your furniture so you have a clear path. Avoid moving your furniture around.  If any of your floors are uneven, fix them.  If there are any pets around you, be aware of where they are.  Review your medicines with your doctor. Some medicines can make you feel dizzy. This can increase your chance of falling. Ask your doctor what other things that you can do to help prevent falls. This information is not intended to replace advice given to you by your health care provider. Make sure you discuss any questions you have with your health care provider. Document Released: 02/11/2009 Document Revised: 09/23/2015 Document Reviewed: 05/22/2014 Elsevier Interactive Patient Education  2017 [employer].

## 2020-06-11 NOTE — Progress Notes (Signed)
Subjective:   Brianna Mcdonald is a 87 y.o. female who presents for Medicare Annual (Subsequent) preventive examination.  Review of Systems    No ROS. Medicare Wellness Visit. Cardiac Risk Factors include: advanced age (>86men, >25 women);diabetes mellitus;dyslipidemia;hypertension     Objective:    Today's Vitals   06/11/20 0955  BP: 124/60  Pulse: 63  Temp: 98.3 F (36.8 C)  Weight: 129 lb 3.2 oz (58.6 kg)  Height: 5\' 1"  (1.549 m)  PF: 99 L/min  PainSc: 0-No pain   Body mass index is 24.41 kg/m.  Advanced Directives 06/11/2020 02/28/2019 01/07/2018 05/26/2016 05/25/2016 11/09/2015 03/14/2015  Does Patient Have a Medical Advance Directive? No No No Yes No No Yes  Type of Advance Directive - - 03/16/2015;Living will - - Living will;Healthcare Power of Attorney  Does patient want to make changes to medical advance directive? - - - Yes (MAU/Ambulatory/Procedural Areas - Information given) - - No - Patient declined  Copy of Healthcare Power of Attorney in Chart? - - - No - copy requested - - Yes  Would patient like information on creating a medical advance directive? No - Patient declined Yes (ED - Information included in AVS) Yes (ED - Information included in AVS) - No - Patient declined No - patient declined information -    Current Medications (verified) Outpatient Encounter Medications as of 06/11/2020  Medication Sig  . acetaminophen (TYLENOL) 325 MG tablet Take 650 mg by mouth every 6 (six) hours as needed.  08/09/2020 albuterol (PROAIR HFA) 108 (90 Base) MCG/ACT inhaler Inhale 1 puff into the lungs every 6 (six) hours as needed for wheezing or shortness of breath (or with excersize).  . Cyanocobalamin (VITAMIN B-12) 1000 MCG SUBL Place 1 tablet under the tongue daily.  Marland Kitchen desloratadine (CLARINEX) 5 MG tablet Take 1 tablet by mouth once daily  . fish oil-omega-3 fatty acids 1000 MG capsule Take 1 capsule by mouth daily.  . fluticasone (FLONASE) 50 MCG/ACT nasal  spray Use 2 spray(s) in each nostril once daily  . lansoprazole (PREVACID) 30 MG capsule Take 1 capsule by mouth once daily  . latanoprost (XALATAN) 0.005 % ophthalmic solution   . metoprolol succinate (TOPROL-XL) 25 MG 24 hr tablet Take 1 tablet by mouth once daily  . selenium 50 MCG TABS tablet Take 200 mcg by mouth daily. Patient states that she is takin g 200 mcg.  . traMADol (ULTRAM) 50 MG tablet Take 1 tablet (50 mg total) by mouth every 6 (six) hours as needed.   No facility-administered encounter medications on file as of 06/11/2020.    Allergies (verified) Aspirin, Moxifloxacin, Penicillins, Propofol, Hydrochlorothiazide, Metformin and related, and Sulfonamide derivatives   History: Past Medical History:  Diagnosis Date  . Allergy   . Bronchitis, not specified as acute or chronic   . Chest pain, unspecified   . Diabetes mellitus   . Edema   . GERD (gastroesophageal reflux disease)   . Hyperlipidemia   . Hypertension   . Irritable bowel syndrome   . Other specified disorders of liver   . Palpitations    Past Surgical History:  Procedure Laterality Date  . APPENDECTOMY    . DILATION AND CURETTAGE OF UTERUS     x 3  . WRIST SURGERY     Family History  Problem Relation Age of Onset  . Asthma Mother   . Arthritis Mother        rheumatism  . Allergies Sister   .  Drug abuse Other   . Allergies Other   . Arthritis Other   . Cancer Brother        tear ducts.    Social History   Socioeconomic History  . Marital status: Widowed    Spouse name: Not on file  . Number of children: 1  . Years of education: Not on file  . Highest education level: Not on file  Occupational History  . Occupation: retired  Tobacco Use  . Smoking status: Never Smoker  . Smokeless tobacco: Never Used  Vaping Use  . Vaping Use: Never used  Substance and Sexual Activity  . Alcohol use: No  . Drug use: No  . Sexual activity: Not Currently    Birth control/protection: Post-menopausal   Other Topics Concern  . Not on file  Social History Narrative   Adopted 1 daughter who lives with her. Patient states this has brought a lot of stress to her life but states presently she feels safe in her home environment. She seeks counsel from her church.    Widowed   retired   Chemical engineer Strain: Low Risk   . Difficulty of Paying Living Expenses: Not hard at all  Food Insecurity: No Food Insecurity  . Worried About Programme researcher, broadcasting/film/video in the Last Year: Never true  . Ran Out of Food in the Last Year: Never true  Transportation Needs: No Transportation Needs  . Lack of Transportation (Medical): No  . Lack of Transportation (Non-Medical): No  Physical Activity: Inactive  . Days of Exercise per Week: 0 days  . Minutes of Exercise per Session: 0 min  Stress: Stress Concern Present  . Feeling of Stress : Very much  Social Connections: Moderately Integrated  . Frequency of Communication with Friends and Family: More than three times a week  . Frequency of Social Gatherings with Friends and Family: Once a week  . Attends Religious Services: More than 4 times per year  . Active Member of Clubs or Organizations: Yes  . Attends Banker Meetings: More than 4 times per year  . Marital Status: Widowed    Tobacco Counseling Counseling given: Not Answered   Clinical Intake:  Pre-visit preparation completed: Yes  Pain : No/denies pain Pain Score: 0-No pain     BMI - recorded: 24.41 Nutritional Status: BMI of 19-24  Normal Nutritional Risks: None Diabetes: No  How often do you need to have someone help you when you read instructions, pamphlets, or other written materials from your doctor or pharmacy?: 1 - Never What is the last grade level you completed in school?: HSG  Diabetic? no  Interpreter Needed?: No  Information entered by :: Susie Cassette, LPN   Activities of Daily Living In your present state of health,  do you have any difficulty performing the following activities: 06/11/2020 03/22/2020  Hearing? N N  Vision? - N  Difficulty concentrating or making decisions? N N  Walking or climbing stairs? N N  Dressing or bathing? N N  Doing errands, shopping? N N  Preparing Food and eating ? N -  Using the Toilet? N -  In the past six months, have you accidently leaked urine? N -  Do you have problems with loss of bowel control? N -  Managing your Medications? N -  Managing your Finances? N -  Housekeeping or managing your Housekeeping? N -  Some recent data might be hidden    Patient  Care Team: Etta Grandchild, MD as PCP - General (Internal Medicine) Felecia Shelling, DPM as Consulting Physician (Podiatry) Selena Batten Alecia Lemming, MD (Ophthalmology)  Indicate any recent Medical Services you may have received from other than [facility] providers in the past year (date may be approximate).     Assessment:   This is a routine wellness examination for Brianna Mcdonald.  Hearing/Vision screen No exam data present  Dietary issues and exercise activities discussed: Current Exercise Habits: The patient does not participate in regular exercise at present  Goals    .  Patient Stated      Stay as healthy and as independent as possible.    .  patient states (pt-stated)      "move to [place] or [place]", states for better health care.       Depression Screen PHQ 2/9 Scores 06/11/2020 03/22/2020 02/28/2019 01/07/2018 07/03/2017 05/25/2016 03/14/2015  PHQ - 2 Score 0 0 1 1 0 0 0  PHQ- 9 Score - - - 3 - - -    Fall Risk Fall Risk  06/11/2020 03/22/2020 02/28/2019 01/07/2018 07/03/2017  Falls in the past year? 0 0 0 No No  Comment - - - - -  Number falls in past yr: 0 - 0 - -  Comment - - - - -  Injury with Fall? 0 - 0 - -  Risk for fall due to : No Fall Risks - - Impaired balance/gait -    FALL RISK PREVENTION PERTAINING TO THE HOME:  Any stairs in or around the home? No  If so, are there any without handrails? No  Home  free of loose throw rugs in walkways, pet beds, electrical cords, etc? Yes  Adequate lighting in your home to reduce risk of falls? Yes   ASSISTIVE DEVICES UTILIZED TO PREVENT FALLS:  Life alert? No  Use of a cane, walker or w/c? No  Grab bars in the bathroom? No  Shower chair or bench in shower? No  Elevated toilet seat or a handicapped toilet? No   TIMED UP AND GO:  Was the test performed? No .  Length of time to ambulate 10 feet: 0 sec.   Gait steady and fast without use of assistive device  Cognitive Function: Normal cognitive status assessed by direct observation by this Nurse Health Advisor. No abnormalities found.   MMSE - Mini Mental State Exam 01/07/2018  Orientation to time 5  Orientation to Place 5  Registration 3  Attention/ Calculation 3  Recall 2  Language- name 2 objects 2  Language- repeat 1  Language- follow 3 step command 3  Language- read & follow direction 1  Write a sentence 1  Copy design 1  Total score 27     6CIT Screen 02/28/2019  What Year? 0 points  What month? 0 points  What time? 0 points  Count back from 20 0 points  Months in reverse 0 points  Repeat phrase 4 points  Total Score 4    Immunizations Immunization History  Administered Date(s) Administered  . Fluad Quad(high Dose 65+) 03/22/2020  . Influenza Split 01/23/2012  . Influenza Whole 02/05/2011  . Influenza, High Dose Seasonal PF 02/21/2016, 02/20/2017  . Influenza,inj,Quad PF,6+ Mos 01/17/2013, 01/20/2014, 03/09/2015  . Pneumococcal Conjugate-13 03/04/2013  . Pneumococcal Polysaccharide-23 12/16/2009, 08/24/2015  . Tdap 03/04/2013    TDAP status: Up to date  Flu Vaccine status: Up to date  Pneumococcal vaccine status: Up to date  Covid-19 vaccine status: Completed vaccines  Qualifies for Shingles Vaccine? Yes   Zostavax completed No   Shingrix Completed?: No.    Education has been provided regarding the importance of this vaccine. Patient has been advised to call  insurance company to determine out of pocket expense if they have not yet received this vaccine. Advised may also receive vaccine at local pharmacy or Health Dept. Verbalized acceptance and understanding.  Screening Tests Health Maintenance  Topic Date Due  . COVID-19 Vaccine (1) Never done  . OPHTHALMOLOGY EXAM  09/06/2017  . URINE MICROALBUMIN  02/20/2018  . HEMOGLOBIN A1C  09/19/2020  . FOOT EXAM  04/28/2021  . TETANUS/TDAP  03/05/2023  . INFLUENZA VACCINE  Completed  . DEXA SCAN  Completed  . PNA vac Low Risk Adult  Completed    Health Maintenance  Health Maintenance Due  Topic Date Due  . COVID-19 Vaccine (1) Never done  . OPHTHALMOLOGY EXAM  09/06/2017  . URINE MICROALBUMIN  02/20/2018    Colorectal cancer screening: No longer required.   Mammogram status: Completed 11/23/2016. Repeat every year  Bone Density status: Completed 11/28/2016. Results reflect: Bone density results: NORMAL. Repeat every 0 years.  Lung Cancer Screening: (Low Dose CT Chest recommended if Age 21-80 years, 30 pack-year currently smoking OR have quit w/in 15years.) does not qualify.   Lung Cancer Screening Referral: no  Additional Screening:  Hepatitis C Screening: does not qualify; Completed no  Vision Screening: Recommended annual ophthalmology exams for early detection of glaucoma and other disorders of the eye. Is the patient up to date with their annual eye exam?  Yes  Who is the provider or what is the name of the office in which the patient attends annual eye exams? [facility] If pt is not established with a provider, would they like to be referred to a provider to establish care? No .   Dental Screening: Recommended annual dental exams for proper oral hygiene  Community Resource Referral / Chronic Care Management: CRR required this visit?  No   CCM required this visit?  No      Plan:     I have personally reviewed and noted the following in the patient's chart:    . Medical and social history . Use of alcohol, tobacco or illicit drugs  . Current medications and supplements . Functional ability and status . Nutritional status . Physical activity . Advanced directives . List of other physicians . Hospitalizations, surgeries, and ER visits in previous 12 months . Vitals . Screenings to include cognitive, depression, and falls . Referrals and appointments  In addition, I have reviewed and discussed with patient certain preventive protocols, quality metrics, and best practice recommendations. A written personalized care plan for preventive services as well as general preventive health recommendations were provided to patient.     Mickeal Needy, LPN   7/67/2094   Nurse Notes: n/a

## 2020-07-28 ENCOUNTER — Ambulatory Visit (INDEPENDENT_AMBULATORY_CARE_PROVIDER_SITE_OTHER): Payer: Medicare Other | Admitting: Podiatry

## 2020-07-28 ENCOUNTER — Encounter: Payer: Self-pay | Admitting: Podiatry

## 2020-07-28 ENCOUNTER — Other Ambulatory Visit: Payer: Self-pay

## 2020-07-28 DIAGNOSIS — B351 Tinea unguium: Secondary | ICD-10-CM

## 2020-07-28 DIAGNOSIS — M79674 Pain in right toe(s): Secondary | ICD-10-CM

## 2020-07-28 NOTE — Progress Notes (Signed)
This patient returns to the office for evaluation and treatment of long thick painful nails .  This patient is unable to trim her own nails since the patient cannot reach her feet.  Patient says the nails are painful walking and wearing his shoes.  He returns for preventive foot care services.  General Appearance  Alert, conversant and in no acute stress.  Vascular  Dorsalis pedis   pulses are palpable  Bilaterally.  Posterior tibial pulses are absent  B/L.  Capillary return is within normal limits  bilaterally. Cold feet.    Bilaterally.  Absent digital hair  B/L.  Neurologic  Senn-Weinstein monofilament wire test within normal limits  bilaterally. Muscle power within normal limits bilaterally.  Nails Thick disfigured discolored nails with subungual debris   hallux and second toes right foot.. No evidence of bacterial infection or drainage bilaterally.  Orthopedic  No limitations of motion  feet .  No crepitus or effusions noted.  No bony pathology or digital deformities noted.  Skin  normotropic skin with no porokeratosis noted bilaterally.  No signs of infections or ulcers noted.     Onychomycosis  Pain in toes right foot  Pain in toes left foot  Debridement  of nails  1-5  B/L with a nail nipper.  Nails were then filed using a dremel tool with no incidents.    RTC  3 months    Ryder Chesmore DPM  

## 2020-10-27 ENCOUNTER — Encounter: Payer: Self-pay | Admitting: Podiatry

## 2020-10-27 ENCOUNTER — Ambulatory Visit (INDEPENDENT_AMBULATORY_CARE_PROVIDER_SITE_OTHER): Payer: Medicare Other | Admitting: Podiatry

## 2020-10-27 ENCOUNTER — Other Ambulatory Visit: Payer: Self-pay

## 2020-10-27 DIAGNOSIS — M79674 Pain in right toe(s): Secondary | ICD-10-CM

## 2020-10-27 DIAGNOSIS — B351 Tinea unguium: Secondary | ICD-10-CM | POA: Diagnosis not present

## 2020-10-27 NOTE — Progress Notes (Signed)
This patient returns to the office for evaluation and treatment of long thick painful nails .  This patient is unable to trim her own nails since the patient cannot reach her feet.  Patient says the nails are painful walking and wearing his shoes.  He returns for preventive foot care services.  General Appearance  Alert, conversant and in no acute stress.  Vascular  Dorsalis pedis   pulses are palpable  Bilaterally.  Posterior tibial pulses are absent  B/L.  Capillary return is within normal limits  bilaterally. Cold feet.    Bilaterally.  Absent digital hair  B/L.  Neurologic  Senn-Weinstein monofilament wire test within normal limits  bilaterally. Muscle power within normal limits bilaterally.  Nails Thick disfigured discolored nails with subungual debris   hallux and second toes right foot.. No evidence of bacterial infection or drainage bilaterally.  Orthopedic  No limitations of motion  feet .  No crepitus or effusions noted.  No bony pathology or digital deformities noted.  Skin  normotropic skin with no porokeratosis noted bilaterally.  No signs of infections or ulcers noted.     Onychomycosis  Pain in toes right foot  Pain in toes left foot  Debridement  of nails  1-5  B/L with a nail nipper.  Nails were then filed using a dremel tool with no incidents.    RTC  3 months    Thara Searing DPM  

## 2020-12-14 ENCOUNTER — Other Ambulatory Visit: Payer: Self-pay | Admitting: Internal Medicine

## 2021-02-02 ENCOUNTER — Ambulatory Visit (INDEPENDENT_AMBULATORY_CARE_PROVIDER_SITE_OTHER): Payer: Medicare Other | Admitting: Podiatry

## 2021-02-02 ENCOUNTER — Other Ambulatory Visit: Payer: Self-pay

## 2021-02-02 DIAGNOSIS — B351 Tinea unguium: Secondary | ICD-10-CM

## 2021-02-02 DIAGNOSIS — M79674 Pain in right toe(s): Secondary | ICD-10-CM | POA: Diagnosis not present

## 2021-02-02 DIAGNOSIS — E1159 Type 2 diabetes mellitus with other circulatory complications: Secondary | ICD-10-CM | POA: Insufficient documentation

## 2021-03-08 ENCOUNTER — Other Ambulatory Visit: Payer: Self-pay | Admitting: Internal Medicine

## 2021-04-25 ENCOUNTER — Ambulatory Visit
Admission: EM | Admit: 2021-04-25 | Discharge: 2021-04-25 | Disposition: A | Discharge status: Home / Self Care | Payer: Medicare Other | Attending: Emergency Medicine | Admitting: Emergency Medicine

## 2021-04-25 ENCOUNTER — Telehealth: Payer: Medicare Other | Admitting: Family Medicine

## 2021-04-25 ENCOUNTER — Other Ambulatory Visit: Payer: Self-pay

## 2021-04-25 DIAGNOSIS — K219 Gastro-esophageal reflux disease without esophagitis: Secondary | ICD-10-CM | POA: Diagnosis not present

## 2021-04-25 DIAGNOSIS — R112 Nausea with vomiting, unspecified: Secondary | ICD-10-CM | POA: Diagnosis not present

## 2021-04-25 DIAGNOSIS — J3089 Other allergic rhinitis: Secondary | ICD-10-CM

## 2021-04-25 MED ORDER — ONDANSETRON 8 MG PO TBDP
8.0000 mg | ORAL_TABLET | Freq: Once | ORAL | Status: AC
  Administered 2021-04-25: 12:00:00 8 mg via ORAL

## 2021-04-25 MED ORDER — FLUTICASONE PROPIONATE 50 MCG/ACT NA SUSP: 2.0000 | Freq: Every day | NASAL | 0 refills | Status: DC

## 2021-04-25 MED ORDER — LANSOPRAZOLE 30 MG PO CPDR: 30.0000 mg | DELAYED_RELEASE_CAPSULE | Freq: Every day | ORAL | 2 refills | Status: DC

## 2021-04-25 MED ORDER — ONDANSETRON 4 MG PO TBDP: 4.0000 mg | ORAL_TABLET | Freq: Three times a day (TID) | ORAL | 0 refills | Status: AC | PRN

## 2021-04-25 NOTE — Discharge Instructions (Addendum)
Please continue Zofran, 1 tablet 3 times daily as needed for nausea and vomiting.  I have renewed your heartburn medicine, lansoprazole, please take 1 capsule every day.  I have renewed your prescription for Flonase nasal spray for your allergies.  Thank you for visiting urgent care today.  I hope you feel better soon.

## 2021-04-25 NOTE — ED Triage Notes (Signed)
Pt reports nausea and vomiting this morning. Pt reports vomiting 7 days today (green with foul smell per patients granddaughter). LBM: yesterday

## 2021-04-25 NOTE — ED Provider Notes (Signed)
[facility]    CSN: [id number] Arrival date & time: 04/25/21  1051    HISTORY  No chief complaint on file.  HPI Brianna Mcdonald is a 87 y.o. female. Patient is here with granddaughter this morning who states patient has vomited 7 times since getting up today, at this time it is 11 AM.  Granddaughter states that the vomitus has been malodorous, greenish, orangeish, clearish, states patient has not thrown up bright red blood nor has she vomited any material that looks like coffee grounds.  Patient is afebrile on arrival, patient has normal blood pressure normal heart rate.  Patient states that her last BM was yesterday, denies history of constipation.  Patient states she has been able to take her blood pressure medications today.  Patient states she used to take Prevacid 30 mg daily but has been out of her prescription so she recently purchased over-the-counter omeprazole 20 but does not feel like it helps at all.  Patient was provided with Zofran 8 mg ODT on arrival.  Patient did not vomit while in our office.  Granddaughter states that patient's mental status has not been altered, she has not lost consciousness.  Patient states she is not having any nasal congestion, cough, headache, body ache.  Patient does report history of allergies for which she takes does loratadine and a steroid nasal spray, feels at this time her allergies are well controlled.  The history is provided by the patient.  Past Medical History:  Diagnosis Date   Allergy    Bronchitis, not specified as acute or chronic    Chest pain, unspecified    Diabetes mellitus    Edema    GERD (gastroesophageal reflux disease)    Hyperlipidemia    Hypertension    Irritable bowel syndrome    Other specified disorders of liver    Palpitations    Patient Active Problem List   Diagnosis Date Noted   Type 2 diabetes mellitus with vascular disease ([place]) 02/02/2021   Pain due to onychomycosis of toenail of right foot  04/28/2020   Prediabetes 01/07/2018   Primary osteoarthritis involving multiple joints 01/07/2018   Dysphonia, spasmodic 01/31/2017   Eustachian tube dysfunction, bilateral 01/31/2017   Laryngitis, chronic 01/03/2017   Long toenail 02/21/2016   Primary open angle glaucoma of both eyes 05/28/2015   Routine general medical examination at a health care facility 03/14/2015   Abnormal peripheral vision of both eyes 02/26/2015   Age-related nuclear cataract of both eyes 02/26/2015   Osteoporosis 02/13/2012   Disorder of bone and cartilage 02/13/2012   Cervical radiculitis 01/23/2012   Brachial neuritis 01/23/2012   Left thyroid nodule 07/12/2011   Type II diabetes mellitus, uncontrolled 04/07/2011   Hyperlipidemia with target LDL less than 100 12/26/2010   Sleep apnea 08/23/2010   Essential hypertension, benign 09/23/2008   Allergic rhinitis 10/29/2007   GERD 10/29/2007   Irritable bowel syndrome 09/27/2007   Simple hepatic cyst 09/27/2007   Past Surgical History:  Procedure Laterality Date   APPENDECTOMY     DILATION AND CURETTAGE OF UTERUS     x 3   WRIST SURGERY     OB History     Gravida  0   Para  0   Term  0   Preterm  0   AB  0   Living  0      SAB  0   IAB  0   Ectopic  0   Multiple  0  Live Births             Home Medications    Prior to Admission medications   Medication Sig Start Date End Date Taking? Authorizing Provider  acetaminophen (TYLENOL) 325 MG tablet Take 650 mg by mouth every 6 (six) hours as needed.    [provider]  albuterol (PROAIR HFA) 108 (90 Base) MCG/ACT inhaler Inhale 1 puff into the lungs every 6 (six) hours as needed for wheezing or shortness of breath (or with excersize). 08/15/16   Etta Grandchild, MD  Cyanocobalamin (VITAMIN B-12) 1000 MCG SUBL Place 1 tablet under the tongue daily.    [provider]  desloratadine (CLARINEX) 5 MG tablet Take 1 tablet by mouth once daily 05/18/20   Etta Grandchild,  MD  fish oil-omega-3 fatty acids 1000 MG capsule Take 1 capsule by mouth daily.    [provider]  fluticasone Aleda Grana) 50 MCG/ACT nasal spray Use 2 spray(s) in each nostril once daily 10/28/18   Etta Grandchild, MD  lansoprazole (PREVACID) 30 MG capsule Take 1 capsule by mouth once daily 03/04/19   Etta Grandchild, MD  latanoprost (XALATAN) 0.005 % ophthalmic solution  06/29/17   [provider]  latanoprost (XALATAN) 0.005 % ophthalmic solution Apply to eye. 09/15/20 09/15/21  [provider]  metoprolol succinate (TOPROL-XL) 25 MG 24 hr tablet Take 1 tablet by mouth once daily 03/08/21   Etta Grandchild, MD  selenium 50 MCG TABS tablet Take 200 mcg by mouth daily. Patient states that she is takin g 200 mcg.    [provider]  traMADol (ULTRAM) 50 MG tablet Take 1 tablet (50 mg total) by mouth every 6 (six) hours as needed. 03/22/20   Etta Grandchild, MD    Family History Family History  Problem Relation Age of Onset   Asthma Mother    Arthritis Mother        rheumatism   Allergies Sister    Drug abuse Other    Allergies Other    Arthritis Other    Cancer Brother        tear ducts.    Social History Social History   Tobacco Use   Smoking status: Never   Smokeless tobacco: Never  Vaping Use   Vaping Use: Never used  Substance Use Topics   Alcohol use: No   Drug use: No   Allergies   Aspirin, Moxifloxacin, Penicillins, Propofol, Hydrochlorothiazide, Metformin and related, and Sulfonamide derivatives  Review of Systems Review of Systems Pertinent findings noted in history of present illness.   Physical Exam Triage Vital Signs ED Triage Vitals  Enc Vitals Group     BP 02/25/21 0827 (!) 147/82     Pulse Rate 02/25/21 0827 72     Resp 02/25/21 0827 18     Temp 02/25/21 0827 98.3 F (36.8 C)     Temp Source 02/25/21 0827 Oral     SpO2 02/25/21 0827 98 %     Weight --      Height --      Head Circumference --      Peak Flow --       Pain Score 02/25/21 0826 5     Pain Loc --      Pain Edu? --      Excl. in GC? --   No data found.  Updated Vital Signs BP 134/79 (BP Location: Left Arm)    Pulse 68    Temp  98.3 F (36.8 C) (Oral)    Resp 18    SpO2 96%   Physical Exam Vitals and nursing note reviewed.  Constitutional:      General: She is not in acute distress.    Appearance: Normal appearance. She is not ill-appearing.  HENT:     Head: Normocephalic and atraumatic.     Right Ear: Tympanic membrane, ear canal and external ear normal.     Left Ear: Tympanic membrane, ear canal and external ear normal.  Eyes:     General: Lids are normal.        Right eye: No discharge.        Left eye: No discharge.     Extraocular Movements: Extraocular movements intact.     Conjunctiva/sclera: Conjunctivae normal.     Right eye: Right conjunctiva is not injected.     Left eye: Left conjunctiva is not injected.  Neck:     Trachea: Trachea and phonation normal.  Cardiovascular:     Rate and Rhythm: Normal rate and regular rhythm.     Pulses: Normal pulses.     Heart sounds: Normal heart sounds. No murmur heard.   No friction rub. No gallop.  Pulmonary:     Effort: Pulmonary effort is normal. No accessory muscle usage, prolonged expiration or respiratory distress.     Breath sounds: Normal breath sounds. No stridor, decreased air movement or transmitted upper airway sounds. No decreased breath sounds, wheezing, rhonchi or rales.  Chest:     Chest wall: No tenderness.  Abdominal:     General: Abdomen is flat. Bowel sounds are normal. There is no distension.     Palpations: Abdomen is soft. There is no mass.     Tenderness: There is no abdominal tenderness. There is no right CVA tenderness, left CVA tenderness, guarding or rebound.     Hernia: No hernia is present.  Musculoskeletal:        General: Normal range of motion.     Cervical back: Normal range of motion and neck supple. Normal range of motion.  Lymphadenopathy:      Cervical: No cervical adenopathy.  Skin:    General: Skin is warm and dry.     Findings: No erythema or rash.  Neurological:     General: No focal deficit present.     Mental Status: She is alert and oriented to person, place, and time.  Psychiatric:        Mood and Affect: Mood normal.        Behavior: Behavior normal.    Visual Acuity Right Eye Distance:   Left Eye Distance:   Bilateral Distance:    Right Eye Near:   Left Eye Near:    Bilateral Near:     UC Couse / Diagnostics / Procedures:    EKG  Radiology No results found.  Procedures Procedures (including critical care time)  UC Diagnoses / Final Clinical Impressions(s)   I have reviewed the triage vital signs and the nursing notes.  Pertinent labs & imaging results that were available during my care of the patient were reviewed by me and considered in my medical decision making (see chart for details).    Final diagnoses:  Gastroesophageal reflux disease, unspecified whether esophagitis present  Nausea and vomiting, unspecified vomiting type  Perennial allergic rhinitis   Patient reports improvement of nausea at this time.  Patient provided with prescription for Zofran to take as needed for the next several days.  Patient also  provided with a renewal of her prescription for lansoprazole as I think her nausea today is likely secondary to her having been out of this medication.  Patient otherwise appears well, is mentating well, is able to reconcile her medications with me without being prompted, patient kindly brought all of her medications with her today.  Patient also requested refill of Flonase.  ED Prescriptions     Medication Sig Dispense Auth. Provider   lansoprazole (PREVACID) 30 MG capsule Take 1 capsule (30 mg total) by mouth daily. 30 capsule Lynden Oxford Scales, PA-C   ondansetron (ZOFRAN-ODT) 4 MG disintegrating tablet Take 1 tablet (4 mg total) by mouth every 8 (eight) hours as needed for  nausea or vomiting. 20 tablet Lynden Oxford Scales, PA-C   fluticasone (FLONASE) 50 MCG/ACT nasal spray Place 2 sprays into both nostrils daily. 18 mL Lynden Oxford Scales, PA-C      PDMP not reviewed this encounter.  Pending results:  Labs Reviewed - No data to display  Medications Ordered in UC: Medications  ondansetron (ZOFRAN-ODT) disintegrating tablet 8 mg (8 mg Oral Given 04/25/21 1131)    Disposition Upon Discharge:  Condition: stable for discharge home Home: take medications as prescribed; routine discharge instructions as discussed; follow up as advised.  Patient presented with an acute illness with associated systemic symptoms and significant discomfort requiring urgent management. In my opinion, this is a condition that a prudent lay person (someone who possesses an average knowledge of health and medicine) may potentially expect to result in complications if not addressed urgently such as respiratory distress, impairment of bodily function or dysfunction of bodily organs.   Routine symptom specific, illness specific and/or disease specific instructions were discussed with the patient and/or caregiver at length.   As such, the patient has been evaluated and assessed, work-up was performed and treatment was provided in alignment with urgent care protocols and evidence based medicine.  Patient/parent/caregiver has been advised that the patient may require follow up for further testing and treatment if the symptoms continue in spite of treatment, as clinically indicated and appropriate.  If the patient was tested for COVID-19, Influenza and/or RSV, then the patient/parent/guardian was advised to isolate at home pending the results of his/her diagnostic coronavirus test and potentially longer if theyre positive. I have also advised pt that if his/her COVID-19 test returns positive, it's recommended to self-isolate for at least 10 days after symptoms first appeared AND until  fever-free for 24 hours without fever reducer AND other symptoms have improved or resolved. Discussed self-isolation recommendations as well as instructions for household member/close contacts as per the [facility] and [place] DHHS, and also gave patient the [place] packet with this information.  Patient/parent/caregiver has been advised to return to the [facility] or PCP in 3-5 days if no better; to PCP or the Emergency Department if new signs and symptoms develop, or if the current signs or symptoms continue to change or worsen for further workup, evaluation and treatment as clinically indicated and appropriate  The patient will follow up with their current PCP if and as advised. If the patient does not currently have a PCP we will assist them in obtaining one.   The patient may need specialty follow up if the symptoms continue, in spite of conservative treatment and management, for further workup, evaluation, consultation and treatment as clinically indicated and appropriate.   Patient/parent/caregiver verbalized understanding and agreement of plan as discussed.  All questions were addressed during visit.  Please see discharge instructions below  for further details of plan.  Discharge Instructions:   Discharge Instructions      Please continue Zofran, 1 tablet 3 times daily as needed for nausea and vomiting.  I have renewed your heartburn medicine, lansoprazole, please take 1 capsule every day.  I have renewed your prescription for Flonase nasal spray for your allergies.  Thank you for visiting urgent care today.  I hope you feel better soon.        This office note has been dictated using Museum/gallery curator.  Unfortunately, and despite my best efforts, this method of dictation can sometimes lead to occasional typographical or grammatical errors.  I apologize in advance if this occurs.     Lynden Oxford Scales, PA-C 04/25/21 1201

## 2021-04-25 NOTE — Progress Notes (Signed)
[place]   Pt needs to be seen in person.  EMS came to home this morning and suggest Urgent Care  Pt verbalized understanding.

## 2021-05-11 ENCOUNTER — Ambulatory Visit: Payer: Medicare Other | Admitting: Podiatry

## 2021-05-30 ENCOUNTER — Other Ambulatory Visit: Payer: Self-pay | Admitting: Internal Medicine

## 2021-06-07 ENCOUNTER — Telehealth: Payer: Self-pay | Admitting: Internal Medicine

## 2021-06-07 NOTE — Telephone Encounter (Signed)
N/A unable to leave a message for patient to call back to schedule Medicare Annual Wellness Visit  ? ?Last AWV  06/11/20 ? ?Please schedule at anytime with [facility]-Nurse Health Advisor if patient calls the office back.   ? ?40 Minutes appointment  ? ?Any questions, please call me at [phone]  ?

## 2021-06-30 ENCOUNTER — Telehealth: Payer: Self-pay | Admitting: Internal Medicine

## 2021-06-30 NOTE — Telephone Encounter (Signed)
N/A unable to leave a message for patient to call back to schedule Medicare Annual Wellness Visit  ? ?Last AWV  06/11/20 ? ?Please schedule at anytime with [facility] if patient calls the office back.   ? ?40 Minutes appointment  ? ?Any questions, please call me at [phone]  ?

## 2021-07-19 ENCOUNTER — Encounter: Payer: Self-pay | Admitting: Podiatry

## 2021-07-19 ENCOUNTER — Other Ambulatory Visit: Payer: Self-pay

## 2021-07-19 ENCOUNTER — Ambulatory Visit (INDEPENDENT_AMBULATORY_CARE_PROVIDER_SITE_OTHER): Payer: Medicare Other | Admitting: Podiatry

## 2021-07-19 DIAGNOSIS — E1159 Type 2 diabetes mellitus with other circulatory complications: Secondary | ICD-10-CM

## 2021-07-19 DIAGNOSIS — M79674 Pain in right toe(s): Secondary | ICD-10-CM | POA: Diagnosis not present

## 2021-07-19 DIAGNOSIS — B351 Tinea unguium: Secondary | ICD-10-CM

## 2021-07-19 NOTE — Progress Notes (Signed)
This patient returns to the office for evaluation and treatment of long thick painful nails .  This patient is unable to trim her own nails since the patient cannot reach her feet.  Patient says the nails are painful walking and wearing his shoes.  He returns for preventive foot care services.  General Appearance  Alert, conversant and in no acute stress.  Vascular  Dorsalis pedis   pulses are palpable  Bilaterally.  Posterior tibial pulses are absent  B/L.  Capillary return is within normal limits  bilaterally. Cold feet.    Bilaterally.  Absent digital hair  B/L.  Neurologic  Senn-Weinstein monofilament wire test within normal limits  bilaterally. Muscle power within normal limits bilaterally.  Nails Thick disfigured discolored nails with subungual debris   hallux and second toes right foot.. No evidence of bacterial infection or drainage bilaterally.  Orthopedic  No limitations of motion  feet .  No crepitus or effusions noted.  No bony pathology or digital deformities noted.  Skin  normotropic skin with no porokeratosis noted bilaterally.  No signs of infections or ulcers noted.     Onychomycosis  Pain in toes right foot  Pain in toes left foot  Debridement  of nails  1-5  B/L with a nail nipper.  Nails were then filed using a dremel tool with no incidents.    RTC  3 months    Clint Strupp DPM  

## 2021-08-30 ENCOUNTER — Other Ambulatory Visit: Payer: Self-pay | Admitting: Internal Medicine

## 2021-10-19 ENCOUNTER — Ambulatory Visit: Payer: Medicare Other | Admitting: Podiatry

## 2021-11-09 ENCOUNTER — Ambulatory Visit: Payer: Medicare Other | Admitting: Podiatry

## 2021-12-05 ENCOUNTER — Other Ambulatory Visit: Payer: Self-pay | Admitting: Internal Medicine

## 2022-03-02 ENCOUNTER — Other Ambulatory Visit: Payer: Self-pay | Admitting: Internal Medicine

## 2022-04-21 ENCOUNTER — Ambulatory Visit (INDEPENDENT_AMBULATORY_CARE_PROVIDER_SITE_OTHER): Payer: Medicare Other | Admitting: Podiatry

## 2022-04-21 ENCOUNTER — Encounter: Payer: Self-pay | Admitting: Podiatry

## 2022-04-21 DIAGNOSIS — B351 Tinea unguium: Secondary | ICD-10-CM

## 2022-04-21 DIAGNOSIS — M79674 Pain in right toe(s): Secondary | ICD-10-CM | POA: Diagnosis not present

## 2022-04-21 DIAGNOSIS — E1159 Type 2 diabetes mellitus with other circulatory complications: Secondary | ICD-10-CM

## 2022-04-21 NOTE — Progress Notes (Signed)
This patient returns to the office for evaluation and treatment of long thick painful nails .  This patient is unable to trim her own nails since the patient cannot reach her feet.  Patient says the nails are painful walking and wearing his shoes.  He returns for preventive foot care services.  General Appearance  Alert, conversant and in no acute stress.  Vascular  Dorsalis pedis   pulses are palpable  Bilaterally.  Posterior tibial pulses are absent  B/L.  Capillary return is within normal limits  bilaterally. Cold feet.    Bilaterally.  Absent digital hair  B/L.  Neurologic  Senn-Weinstein monofilament wire test within normal limits  bilaterally. Muscle power within normal limits bilaterally.  Nails Thick disfigured discolored nails with subungual debris   hallux and second toes right foot.. No evidence of bacterial infection or drainage bilaterally.  Orthopedic  No limitations of motion  feet .  No crepitus or effusions noted.  No bony pathology or digital deformities noted.  Skin  normotropic skin with no porokeratosis noted bilaterally.  No signs of infections or ulcers noted.     Onychomycosis  Pain in toes right foot  Pain in toes left foot  Debridement  of nails  1-5  B/L with a nail nipper.  Nails were then filed using a dremel tool with no incidents.    RTC  3 months    Gardiner Barefoot DPM

## 2022-06-10 ENCOUNTER — Other Ambulatory Visit: Payer: Self-pay | Admitting: Internal Medicine

## 2022-06-17 ENCOUNTER — Other Ambulatory Visit: Payer: Self-pay | Admitting: Internal Medicine

## 2022-06-24 ENCOUNTER — Other Ambulatory Visit: Payer: Self-pay | Admitting: Internal Medicine

## 2022-06-26 ENCOUNTER — Other Ambulatory Visit: Payer: Self-pay | Admitting: Internal Medicine

## 2022-06-27 ENCOUNTER — Other Ambulatory Visit: Payer: Self-pay | Admitting: Internal Medicine

## 2022-06-29 ENCOUNTER — Other Ambulatory Visit: Payer: Self-pay | Admitting: Internal Medicine

## 2022-07-03 NOTE — Telephone Encounter (Signed)
Called pt, LVM to schedule.

## 2022-07-05 MED ORDER — DESLORATADINE 5 MG PO TABS: 5.0000 mg | ORAL_TABLET | Freq: Every day | ORAL | 0 refills | Status: DC

## 2022-07-05 MED ORDER — LANSOPRAZOLE 30 MG PO CPDR: 30.0000 mg | DELAYED_RELEASE_CAPSULE | Freq: Every day | ORAL | 0 refills | Status: DC

## 2022-07-05 MED ORDER — METOPROLOL SUCCINATE ER 25 MG PO TB24: 25.0000 mg | ORAL_TABLET | Freq: Every day | ORAL | 0 refills | Status: DC

## 2022-07-05 NOTE — Telephone Encounter (Signed)
Pt call back and made appt for 07/19/22. @ 8:20.Marland KitchenJohny Chess

## 2022-07-19 ENCOUNTER — Ambulatory Visit (INDEPENDENT_AMBULATORY_CARE_PROVIDER_SITE_OTHER): Payer: Medicare Other | Admitting: Internal Medicine

## 2022-07-19 ENCOUNTER — Encounter: Payer: Self-pay | Admitting: Internal Medicine

## 2022-07-19 VITALS — BP 132/58 | HR 64 | Temp 98.1°F | Resp 16 | Ht 61.0 in | Wt 125.0 lb

## 2022-07-19 DIAGNOSIS — R7303 Prediabetes: Secondary | ICD-10-CM

## 2022-07-19 DIAGNOSIS — E041 Nontoxic single thyroid nodule: Secondary | ICD-10-CM | POA: Diagnosis not present

## 2022-07-19 DIAGNOSIS — I10 Essential (primary) hypertension: Secondary | ICD-10-CM

## 2022-07-19 DIAGNOSIS — M818 Other osteoporosis without current pathological fracture: Secondary | ICD-10-CM | POA: Diagnosis not present

## 2022-07-19 DIAGNOSIS — E785 Hyperlipidemia, unspecified: Secondary | ICD-10-CM

## 2022-07-19 DIAGNOSIS — E559 Vitamin D deficiency, unspecified: Secondary | ICD-10-CM

## 2022-07-19 DIAGNOSIS — K7689 Other specified diseases of liver: Secondary | ICD-10-CM

## 2022-07-19 LAB — CBC WITH DIFFERENTIAL/PLATELET
Basophils Absolute: 0 10*3/uL (ref 0.0–0.1)
Basophils Relative: 0.4 % (ref 0.0–3.0)
Eosinophils Absolute: 0.2 10*3/uL (ref 0.0–0.7)
Eosinophils Relative: 3 % (ref 0.0–5.0)
HCT: 39.5 % (ref 36.0–46.0)
Hemoglobin: 13.2 g/dL (ref 12.0–15.0)
Lymphocytes Relative: 29.4 % (ref 12.0–46.0)
Lymphs Abs: 1.6 10*3/uL (ref 0.7–4.0)
MCHC: 33.4 g/dL (ref 30.0–36.0)
MCV: 97.2 fl (ref 78.0–100.0)
Monocytes Absolute: 0.4 10*3/uL (ref 0.1–1.0)
Monocytes Relative: 8.3 % (ref 3.0–12.0)
Neutro Abs: 3.2 10*3/uL (ref 1.4–7.7)
Neutrophils Relative %: 58.9 % (ref 43.0–77.0)
Platelets: 225 10*3/uL (ref 150.0–400.0)
RBC: 4.07 Mil/uL (ref 3.87–5.11)
RDW: 13.2 % (ref 11.5–15.5)
WBC: 5.4 10*3/uL (ref 4.0–10.5)

## 2022-07-19 LAB — HEPATIC FUNCTION PANEL
ALT: 8 U/L (ref 0–35)
AST: 16 U/L (ref 0–37)
Albumin: 3.8 g/dL (ref 3.5–5.2)
Alkaline Phosphatase: 63 U/L (ref 39–117)
Bilirubin, Direct: 0.1 mg/dL (ref 0.0–0.3)
Total Bilirubin: 0.4 mg/dL (ref 0.2–1.2)
Total Protein: 7.7 g/dL (ref 6.0–8.3)

## 2022-07-19 LAB — TSH: TSH: 2.2 u[IU]/mL (ref 0.35–5.50)

## 2022-07-19 LAB — BASIC METABOLIC PANEL
BUN: 19 mg/dL (ref 6–23)
CO2: 32 mEq/L (ref 19–32)
Calcium: 9.9 mg/dL (ref 8.4–10.5)
Chloride: 101 mEq/L (ref 96–112)
Creatinine, Ser: 0.92 mg/dL (ref 0.40–1.20)
GFR: 55.86 mL/min — ABNORMAL LOW (ref >60.00)
Glucose, Bld: 79 mg/dL (ref 70–99)
Potassium: 4.4 mEq/L (ref 3.5–5.1)
Sodium: 139 mEq/L (ref 135–145)

## 2022-07-19 LAB — VITAMIN D 25 HYDROXY (VIT D DEFICIENCY, FRACTURES): VITD: 23.77 ng/mL — ABNORMAL LOW (ref 30.00–100.00)

## 2022-07-19 LAB — LIPID PANEL
Cholesterol: 213 mg/dL — ABNORMAL HIGH (ref 0–200)
HDL: 68.6 mg/dL (ref >39.00)
LDL Cholesterol: 125 mg/dL — ABNORMAL HIGH (ref 0–99)
NonHDL: 144.2
Total CHOL/HDL Ratio: 3
Triglycerides: 96 mg/dL (ref 0.0–149.0)
VLDL: 19.2 mg/dL (ref 0.0–40.0)

## 2022-07-19 LAB — HEMOGLOBIN A1C: Hgb A1c MFr Bld: 5.8 % (ref 4.6–6.5)

## 2022-07-19 MED ORDER — CHOLECALCIFEROL 50 MCG (2000 UT) PO TABS: 1.0000 | ORAL_TABLET | Freq: Every day | ORAL | 1 refills | Status: AC

## 2022-07-19 NOTE — Progress Notes (Signed)
Subjective:  Patient ID: Brianna Mcdonald, female    DOB: 07-17-1932  Age: 89 y.o. MRN: [id number]  CC: Hypertension and Hyperlipidemia   HPI Brianna Mcdonald presents for f/up -  She is active and denies DOE, CP, edema, SOB.  Outpatient Medications Prior to Visit  Medication Sig Dispense Refill   acetaminophen (TYLENOL) 325 MG tablet Take 650 mg by mouth every 6 (six) hours as needed.     albuterol (PROAIR HFA) 108 (90 Base) MCG/ACT inhaler Inhale 1 puff into the lungs every 6 (six) hours as needed for wheezing or shortness of breath (or with excersize). 18 g 0   Cyanocobalamin (VITAMIN B-12) 1000 MCG SUBL Place 1 tablet under the tongue daily.     desloratadine (CLARINEX) 5 MG tablet Take 1 tablet (5 mg total) by mouth daily. 30 tablet 0   fish oil-omega-3 fatty acids 1000 MG capsule Take 1 capsule by mouth daily.     fluticasone (FLONASE) 50 MCG/ACT nasal spray Place 2 sprays into both nostrils daily. 18 mL 0   lansoprazole (PREVACID) 30 MG capsule Take 1 capsule (30 mg total) by mouth daily. 30 capsule 0   latanoprost (XALATAN) 0.005 % ophthalmic solution      metoprolol succinate (TOPROL-XL) 25 MG 24 hr tablet Take 1 tablet (25 mg total) by mouth daily. 30 tablet 0   ondansetron (ZOFRAN-ODT) 4 MG disintegrating tablet Take 1 tablet (4 mg total) by mouth every 8 (eight) hours as needed for nausea or vomiting. 20 tablet 0   selenium 50 MCG TABS tablet Take 200 mcg by mouth daily. Patient states that she is takin g 200 mcg.     traMADol (ULTRAM) 50 MG tablet Take 1 tablet (50 mg total) by mouth every 6 (six) hours as needed. 90 tablet 5   No facility-administered medications prior to visit.    ROS Review of Systems  Constitutional: Negative.  Negative for diaphoresis and fatigue.  HENT: Negative.    Eyes: Negative.   Respiratory:  Negative for cough, chest tightness, shortness of breath and wheezing.   Cardiovascular:  Negative for chest pain, palpitations and leg swelling.   Gastrointestinal:  Negative for abdominal pain, constipation, diarrhea, nausea and vomiting.  Endocrine: Negative.   Genitourinary: Negative.  Negative for difficulty urinating.  Musculoskeletal:  Positive for gait problem.  Skin: Negative.  Negative for color change and pallor.  Neurological:  Positive for tremors. Negative for dizziness and weakness.  Hematological:  Negative for adenopathy. Does not bruise/bleed easily.  Psychiatric/Behavioral:  Positive for confusion, decreased concentration and dysphoric mood. The patient is not nervous/anxious.     Objective:  BP (!) 132/58 (BP Location: Right Arm, Patient Position: Sitting, Cuff Size: Large)   Pulse 64   Temp 98.1 F (36.7 C) (Oral)   Resp 16   Ht 5\' 1"  (1.549 m)   Wt 125 lb (56.7 kg)   SpO2 97%   BMI 23.62 kg/m   BP Readings from Last 3 Encounters:  07/19/22 (!) 132/58  04/25/21 134/79  06/11/20 124/60    Wt Readings from Last 3 Encounters:  07/19/22 125 lb (56.7 kg)  06/11/20 129 lb 3.2 oz (58.6 kg)  03/22/20 133 lb (60.3 kg)    Physical Exam Vitals reviewed.  Constitutional:      Appearance: She is not ill-appearing.  HENT:     Nose: Nose normal.     Mouth/Throat:     Mouth: Mucous membranes are moist.  Eyes:  General: No scleral icterus.    Conjunctiva/sclera: Conjunctivae normal.  Cardiovascular:     Rate and Rhythm: Normal rate and regular rhythm.     Heart sounds: No murmur heard. Pulmonary:     Effort: Pulmonary effort is normal.     Breath sounds: No stridor. No wheezing, rhonchi or rales.  Abdominal:     General: Abdomen is flat.     Palpations: There is no mass.     Tenderness: There is no abdominal tenderness. There is no guarding.     Hernia: No hernia is present.  Musculoskeletal:        General: Normal range of motion.     Cervical back: Neck supple.     Right lower leg: No edema.     Left lower leg: No edema.  Lymphadenopathy:     Cervical: No cervical adenopathy.  Skin:     General: Skin is warm and dry.  Neurological:     General: No focal deficit present.     Mental Status: She is alert.  Psychiatric:        Mood and Affect: Mood normal.        Behavior: Behavior normal.     Lab Results  Component Value Date   WBC 5.4 07/19/2022   HGB 13.2 07/19/2022   HCT 39.5 07/19/2022   PLT 225.0 07/19/2022   GLUCOSE 79 07/19/2022   CHOL 213 (H) 07/19/2022   TRIG 96.0 07/19/2022   HDL 68.60 07/19/2022   LDLDIRECT 144.4 10/30/2012   LDLCALC 125 (H) 07/19/2022   ALT 8 07/19/2022   AST 16 07/19/2022   NA 139 07/19/2022   K 4.4 07/19/2022   CL 101 07/19/2022   CREATININE 0.92 07/19/2022   BUN 19 07/19/2022   CO2 32 07/19/2022   TSH 2.20 07/19/2022   HGBA1C 5.8 07/19/2022   MICROALBUR <0.7 02/20/2017    No results found.  Assessment & Plan:   Hyperlipidemia with target LDL less than 100 - Statin is not indicated. -     Lipid panel; Future -     TSH; Future -     Hepatic function panel; Future  Prediabetes -     Basic metabolic panel; Future -     Hemoglobin A1c; Future  Simple hepatic cyst -     Hepatic function panel; Future  Other osteoporosis without current pathological fracture -     Basic metabolic panel; Future -     VITAMIN D 25 Hydroxy (Vit-D Deficiency, Fractures); Future  Left thyroid nodule- She is euthyroid. -     TSH; Future  Essential hypertension, benign- Her blood pressure is well-controlled. -     Basic metabolic panel; Future -     CBC with Differential/Platelet; Future -     TSH; Future -     Hepatic function panel; Future     Follow-up: No follow-ups on file.  Scarlette Calico, MD

## 2022-07-21 ENCOUNTER — Ambulatory Visit (INDEPENDENT_AMBULATORY_CARE_PROVIDER_SITE_OTHER): Payer: Medicare Other | Admitting: Podiatry

## 2022-07-21 ENCOUNTER — Encounter: Payer: Self-pay | Admitting: Podiatry

## 2022-07-21 DIAGNOSIS — M79674 Pain in right toe(s): Secondary | ICD-10-CM | POA: Diagnosis not present

## 2022-07-21 DIAGNOSIS — E1159 Type 2 diabetes mellitus with other circulatory complications: Secondary | ICD-10-CM

## 2022-07-21 DIAGNOSIS — B351 Tinea unguium: Secondary | ICD-10-CM | POA: Diagnosis not present

## 2022-07-21 NOTE — Progress Notes (Signed)
This patient returns to the office for evaluation and treatment of long thick painful nails .  This patient is unable to trim her own nails since the patient cannot reach her feet.  Patient says the nails are painful walking and wearing his shoes.  He returns for preventive foot care services.  General Appearance  Alert, conversant and in no acute stress.  Vascular  Dorsalis pedis   pulses are palpable  Bilaterally.  Posterior tibial pulses are absent  B/L.  Capillary return is within normal limits  bilaterally. Cold feet.    Bilaterally.  Absent digital hair  B/L.  Neurologic  Senn-Weinstein monofilament wire test within normal limits  bilaterally. Muscle power within normal limits bilaterally.  Nails Thick disfigured discolored nails with subungual debris   hallux and second toes right foot.. No evidence of bacterial infection or drainage bilaterally.  Orthopedic  No limitations of motion  feet .  No crepitus or effusions noted.  No bony pathology or digital deformities noted.  Skin  normotropic skin with no porokeratosis noted bilaterally.  No signs of infections or ulcers noted.     Onychomycosis  Pain in toes right foot  Pain in toes left foot  Debridement  of nails  1-5  B/L with a nail nipper.  Nails were then filed using a dremel tool with no incidents.    RTC  3 months    Gardiner Barefoot DPM

## 2022-08-07 ENCOUNTER — Other Ambulatory Visit: Payer: Self-pay | Admitting: Internal Medicine

## 2022-08-07 MED ORDER — LEVOCETIRIZINE DIHYDROCHLORIDE 5 MG PO TABS: 5.0000 mg | ORAL_TABLET | Freq: Every evening | ORAL | 1 refills | Status: DC

## 2022-08-08 ENCOUNTER — Other Ambulatory Visit: Payer: Self-pay | Admitting: Internal Medicine

## 2022-09-23 ENCOUNTER — Other Ambulatory Visit: Payer: Self-pay | Admitting: Internal Medicine

## 2022-09-23 DIAGNOSIS — J301 Allergic rhinitis due to pollen: Secondary | ICD-10-CM

## 2022-10-23 ENCOUNTER — Encounter: Payer: Self-pay | Admitting: Podiatry

## 2022-10-23 ENCOUNTER — Ambulatory Visit (INDEPENDENT_AMBULATORY_CARE_PROVIDER_SITE_OTHER): Payer: Medicare Other | Admitting: Podiatry

## 2022-10-23 DIAGNOSIS — E1159 Type 2 diabetes mellitus with other circulatory complications: Secondary | ICD-10-CM | POA: Diagnosis not present

## 2022-10-23 DIAGNOSIS — M79674 Pain in right toe(s): Secondary | ICD-10-CM | POA: Diagnosis not present

## 2022-10-23 DIAGNOSIS — B351 Tinea unguium: Secondary | ICD-10-CM

## 2022-10-23 NOTE — Progress Notes (Signed)
This patient returns to the office for evaluation and treatment of long thick painful nails .  This patient is unable to trim her own nails since the patient cannot reach her feet.  Patient says the nails are painful walking and wearing his shoes.  He returns for preventive foot care services. ? ?General Appearance  Alert, conversant and in no acute stress. ? ?Vascular  Dorsalis pedis   pulses are palpable  Bilaterally.  Posterior tibial pulses are absent  B/L.  Capillary return is within normal limits  bilaterally. Cold feet.    Bilaterally.  Absent digital hair  B/L. ? ?Neurologic  Senn-Weinstein monofilament wire test within normal limits  bilaterally. Muscle power within normal limits bilaterally. ? ?Nails Thick disfigured discolored nails with subungual debris   hallux and second toes right foot.. No evidence of bacterial infection or drainage bilaterally. ? ?Orthopedic  No limitations of motion  feet .  No crepitus or effusions noted.  No bony pathology or digital deformities noted. ? ?Skin  normotropic skin with no porokeratosis noted bilaterally.  No signs of infections or ulcers noted.    ? ?Onychomycosis  Pain in toes right foot  Pain in toes left foot ? ?Debridement  of nails  1-5  B/L with a nail nipper.  Nails were then filed using a dremel tool with no incidents.    RTC  3 months  ? ? ?Jolleen Seman DPM  ?

## 2022-11-08 ENCOUNTER — Ambulatory Visit (INDEPENDENT_AMBULATORY_CARE_PROVIDER_SITE_OTHER): Payer: Medicare Other

## 2022-11-08 VITALS — BP 136/72 | HR 54 | Temp 98.2°F | Ht 61.0 in | Wt 121.2 lb

## 2022-11-08 DIAGNOSIS — Z0000 Encounter for general adult medical examination without abnormal findings: Secondary | ICD-10-CM | POA: Diagnosis not present

## 2022-11-08 NOTE — Progress Notes (Signed)
Subjective:   Brianna Mcdonald is a 89 y.o. female who presents for Medicare Annual (Subsequent) preventive examination.  Visit Complete: In person  Review of Systems    Defer to PCP Cardiac Risk Factors include: advanced age (>29men, >3 women);hypertension;dyslipidemia     Objective:    Today's Vitals   11/08/22 0903  BP: 136/72  Pulse: (!) 54  Temp: 98.2 F (36.8 C)  TempSrc: Temporal  SpO2: 93%  Weight: 121 lb 4 oz (55 kg)  Height: 5\' 1"  (1.549 m)   Body mass index is 22.91 kg/m.     11/08/2022   11:10 AM 11/08/2022   10:57 AM 06/11/2020    3:17 PM 02/28/2019   12:02 PM 01/07/2018    9:45 AM 05/26/2016    8:00 AM 05/25/2016    9:30 AM  Advanced Directives  Does Patient Have a Medical Advance Directive? Yes Yes No No No Yes No  Type of Advance Directive Living will Living will    Healthcare Power of [place];Living will   Does patient want to make changes to medical advance directive? No - Patient declined No - Patient declined    Yes (MAU/Ambulatory/Procedural Areas - Information given)   Copy of Healthcare Power of Attorney in Chart?      No - copy requested   Would patient like information on creating a medical advance directive?   No - Patient declined Yes (ED - Information included in AVS) Yes (ED - Information included in AVS)  No - Patient declined    Current Medications (verified) Outpatient Encounter Medications as of 11/08/2022  Medication Sig   acetaminophen (TYLENOL) 325 MG tablet Take 650 mg by mouth every 6 (six) hours as needed.   albuterol (PROAIR HFA) 108 (90 Base) MCG/ACT inhaler Inhale 1 puff into the lungs every 6 (six) hours as needed for wheezing or shortness of breath (or with excersize).   Cholecalciferol 50 MCG (2000 UT) TABS Take 1 tablet (2,000 Units total) by mouth daily.   Cyanocobalamin (VITAMIN B-12) 1000 MCG SUBL Place 1 tablet under the tongue daily.   desloratadine (CLARINEX) 5 MG tablet Take 1 tablet by mouth once daily   fish  oil-omega-3 fatty acids 1000 MG capsule Take 1 capsule by mouth daily.   fluticasone (FLONASE) 50 MCG/ACT nasal spray Place 2 sprays into both nostrils daily.   lansoprazole (PREVACID) 30 MG capsule Take 1 capsule by mouth once daily   latanoprost (XALATAN) 0.005 % ophthalmic solution    levocetirizine (XYZAL) 5 MG tablet Take 5 mg by mouth daily.   metoprolol succinate (TOPROL-XL) 25 MG 24 hr tablet Take 1 tablet by mouth once daily   ondansetron (ZOFRAN-ODT) 4 MG disintegrating tablet Take 1 tablet (4 mg total) by mouth every 8 (eight) hours as needed for nausea or vomiting. (Patient not taking: Reported on 11/08/2022)   selenium 50 MCG TABS tablet Take 200 mcg by mouth daily. Patient states that she is takin g 200 mcg. (Patient not taking: Reported on 11/08/2022)   traMADol (ULTRAM) 50 MG tablet Take 1 tablet (50 mg total) by mouth every 6 (six) hours as needed. (Patient not taking: Reported on 11/08/2022)   No facility-administered encounter medications on file as of 11/08/2022.    Allergies (verified) Aspirin, Moxifloxacin, Penicillins, Propofol, Hydrochlorothiazide, Metformin and related, and Sulfonamide derivatives   History: Past Medical History:  Diagnosis Date   Allergy    Bronchitis, not specified as acute or chronic    Chest pain, unspecified  Diabetes mellitus    Edema    GERD (gastroesophageal reflux disease)    Hyperlipidemia    Hypertension    Irritable bowel syndrome    Other specified disorders of liver    Palpitations    Past Surgical History:  Procedure Laterality Date   APPENDECTOMY     DILATION AND CURETTAGE OF UTERUS     x 3   WRIST SURGERY     Family History  Problem Relation Age of Onset   Asthma Mother    Arthritis Mother        rheumatism   Allergies Sister    Drug abuse Other    Allergies Other    Arthritis Other    Cancer Brother        tear ducts.    Social History   Socioeconomic History   Marital status: Widowed    Spouse name: Not  on file   Number of children: 1   Years of education: Not on file   Highest education level: Not on file  Occupational History   Occupation: retired  Tobacco Use   Smoking status: Never   Smokeless tobacco: Never  Vaping Use   Vaping Use: Never used  Substance and Sexual Activity   Alcohol use: No   Drug use: No   Sexual activity: Not Currently    Birth control/protection: Post-menopausal  Other Topics Concern   Not on file  Social History Narrative   Adopted 1 daughter who lives with her. Patient states this has brought a lot of stress to her life but states presently she feels safe in her home environment. She seeks counsel from her church.    Widowed   retired   Chemical engineer Strain: Low Risk  (11/08/2022)   Overall Financial Resource Strain (CARDIA)    Difficulty of Paying Living Expenses: Not hard at all  Food Insecurity: No Food Insecurity (11/08/2022)   Hunger Vital Sign    Worried About Running Out of Food in the Last Year: Never true    Ran Out of Food in the Last Year: Never true  Transportation Needs: No Transportation Needs (11/08/2022)   PRAPARE - Administrator, Civil Service (Medical): No    Lack of Transportation (Non-Medical): No  Physical Activity: Inactive (11/08/2022)   Exercise Vital Sign    Days of Exercise per Week: 0 days    Minutes of Exercise per Session: 0 min  Stress: Stress Concern Present (11/08/2022)   [employer] of Occupational Health - Occupational Stress Questionnaire    Feeling of Stress : Very much  Social Connections: Moderately Integrated (11/08/2022)   Social Connection and Isolation Panel [NHANES]    Frequency of Communication with Friends and Family: More than three times a week    Frequency of Social Gatherings with Friends and Family: Three times a week    Attends Religious Services: More than 4 times per year    Active Member of Clubs or Organizations: Yes    Attends Tax inspector Meetings: More than 4 times per year    Marital Status: Widowed    Tobacco Counseling Counseling given: Not Answered   Clinical Intake:  Pre-visit preparation completed: Yes  Pain : No/denies pain     BMI - recorded: 22.91 Nutritional Status: BMI of 19-24  Normal Nutritional Risks: None Diabetes: No  How often do you need to have someone help you when you read instructions, pamphlets, or other written  materials from your doctor or pharmacy?: 1 - Never What is the last grade level you completed in school?: 12th grade  Interpreter Needed?: No      Activities of Daily Living    11/08/2022   10:52 AM  In your present state of health, do you have any difficulty performing the following activities:  Hearing? 0  Vision? 0  Walking or climbing stairs? 0  Dressing or bathing? 0  Doing errands, shopping? 0  Preparing Food and eating ? N  Using the Toilet? N  In the past six months, have you accidently leaked urine? Y  Do you have problems with loss of bowel control? N  Managing your Medications? N  Managing your Finances? N  Housekeeping or managing your Housekeeping? N    Patient Care Team: Etta Grandchild, MD as PCP - General (Internal Medicine) Felecia Shelling, DPM as Consulting Physician (Podiatry) Selena Batten Alecia Lemming, MD (Ophthalmology)  Indicate any recent Medical Services you may have received from other than [facility] providers in the past year (date may be approximate).     Assessment:   This is a routine wellness examination for Paislie.  Hearing/Vision screen Vision Screening - Comments:: Sees eye provider but have not in the past 3 years   Dietary issues and exercise activities discussed:     Goals Addressed               This Visit's Progress     Patient Stated (pt-stated)        To Gain Weight       Depression Screen    11/08/2022   10:55 AM 07/19/2022    8:23 AM 06/11/2020    3:18 PM 03/22/2020    8:58 AM 02/28/2019   12:08 PM  01/07/2018    9:47 AM 07/03/2017   10:00 AM  PHQ 2/9 Scores  PHQ - 2 Score 0 0 0 0 1 1 0  PHQ- 9 Score  0    3     Fall Risk    11/08/2022   10:58 AM 07/19/2022    8:23 AM 06/11/2020    3:17 PM 03/22/2020    8:59 AM 02/28/2019   12:07 PM  Fall Risk   Falls in the past year? 0 0 0 0 0  Number falls in past yr: 0 0 0  0  Injury with Fall? 0 0 0  0  Risk for fall due to : No Fall Risks No Fall Risks No Fall Risks    Follow up Falls evaluation completed Falls evaluation completed       MEDICARE RISK AT HOME:  Medicare Risk at Home - 11/08/22 1059     Any stairs in or around the home? No    If so, are there any without handrails? No    Home free of loose throw rugs in walkways, pet beds, electrical cords, etc? Yes    Life alert? No    Use of a cane, walker or w/c? Yes   cane   Grab bars in the bathroom? Yes    Shower chair or bench in shower? No    Elevated toilet seat or a handicapped toilet? Yes             TIMED UP AND GO:  Was the test performed?  Yes  Length of time to ambulate 10 feet: 45 sec Gait steady and fast with assistive device    Cognitive Function:    01/07/2018  9:53 AM  MMSE - Mini Mental State Exam  Orientation to time 5  Orientation to Place 5  Registration 3  Attention/ Calculation 3  Recall 2  Language- name 2 objects 2  Language- repeat 1  Language- follow 3 step command 3  Language- read & follow direction 1  Write a sentence 1  Copy design 1  Total score 27        11/08/2022   11:00 AM 02/28/2019   12:15 PM  6CIT Screen  What Year? 0 points 0 points  What month? 0 points 0 points  What time? 0 points 0 points  Count back from 20 0 points 0 points  Months in reverse 4 points 0 points  Repeat phrase 8 points 4 points  Total Score 12 points 4 points    Immunizations Immunization History  Administered Date(s) Administered   Fluad Quad(high Dose 65+) 03/22/2020   Influenza Split 01/23/2012   Influenza Whole 02/05/2011    Influenza, High Dose Seasonal PF 02/21/2016, 02/20/2017   Influenza,inj,Quad PF,6+ Mos 01/17/2013, 01/20/2014, 03/09/2015   Pneumococcal Conjugate-13 03/04/2013   Pneumococcal Polysaccharide-23 12/16/2009, 08/24/2015   Tdap 03/04/2013    TDAP status: Up to date  Flu Vaccine status: Up to date  Pneumococcal vaccine status: Up to date  Covid-19 vaccine status: Completed vaccines  Qualifies for Shingles Vaccine? Yes   Zostavax completed No   Shingrix Completed?: No.    Education has been provided regarding the importance of this vaccine. Patient has been advised to call insurance company to determine out of pocket expense if they have not yet received this vaccine. Advised may also receive vaccine at local pharmacy or Health Dept. Verbalized acceptance and understanding.  Screening Tests Health Maintenance  Topic Date Due   COVID-19 Vaccine (1) Never done   Zoster Vaccines- Shingrix (1 of 2) Never done   OPHTHALMOLOGY EXAM  09/06/2017   INFLUENZA VACCINE  11/30/2022   HEMOGLOBIN A1C  01/19/2023   DTaP/Tdap/Td (2 - Td or Tdap) 03/05/2023   FOOT EXAM  10/23/2023   Medicare Annual Wellness (AWV)  11/08/2023   Pneumonia Vaccine 27+ Years old  Completed   DEXA SCAN  Completed   HPV VACCINES  Aged Out    Health Maintenance  Health Maintenance Due  Topic Date Due   COVID-19 Vaccine (1) Never done   Zoster Vaccines- Shingrix (1 of 2) Never done   OPHTHALMOLOGY EXAM  09/06/2017    Colorectal cancer screening: No longer required.   Mammogram status: Completed 11/28/2016. Repeat every year  Bone Density status: Completed 11/28/2016. Results reflect: Bone density results: OSTEOPOROSIS. Repeat every N/A years.  Lung Cancer Screening: (Low Dose CT Chest recommended if Age 43-80 years, 20 pack-year currently smoking OR have quit w/in 15years.) does not qualify.   Lung Cancer Screening Referral: N/A  Additional Screening:  Hepatitis C Screening: does not qualify; Completed  N/A  Vision Screening: Recommended annual ophthalmology exams for early detection of glaucoma and other disorders of the eye. Is the patient up to date with their annual eye exam?  No  Who is the provider or what is the name of the office in which the patient attends annual eye exams? Dr. Selena Batten If pt is not established with a provider, would they like to be referred to a provider to establish care? No .   Dental Screening: Recommended annual dental exams for proper oral hygiene  Diabetic Foot Exam: Diabetic Foot Exam: Completed 10/23/2022  Community Resource Referral / Chronic Care  Management: CRR required this visit?  Yes   CCM required this visit?  No     Plan:     I have personally reviewed and noted the following in the patient's chart:   Medical and social history Use of alcohol, tobacco or illicit drugs  Current medications and supplements including opioid prescriptions. Patient is not currently taking opioid prescriptions. Medication list shows pt is has an opioid medication but stated she is not taking it. Functional ability and status Nutritional status Physical activity Advanced directives List of other physicians Hospitalizations, surgeries, and ER visits in previous 12 months Vitals Screenings to include cognitive, depression, and falls Referrals and appointments  In addition, I have reviewed and discussed with patient certain preventive protocols, quality metrics, and best practice recommendations. A written personalized care plan for preventive services as well as general preventive health recommendations were provided to patient.     Ferdie Ping, CMA   11/08/2022   Nurse Notes:  Ms. Marcelino , Thank you for taking time to come for your Medicare Wellness Visit. I appreciate your ongoing commitment to your health goals. Please review the following plan we discussed and let me know if I can assist you in the future.   These are the goals we discussed:   Goals       Patient Stated      Stay as healthy and as independent as possible.      Patient Stated (pt-stated)      To Gain Weight       patient states (pt-stated)      "move to [place] or [place]", states for better health care.         This is a list of the screening recommended for you and due dates:  Health Maintenance  Topic Date Due   COVID-19 Vaccine (1) Never done   Zoster (Shingles) Vaccine (1 of 2) Never done   Eye exam for diabetics  09/06/2017   Flu Shot  11/30/2022   Hemoglobin A1C  01/19/2023   DTaP/Tdap/Td vaccine (2 - Td or Tdap) 03/05/2023   Complete foot exam   10/23/2023   Medicare Annual Wellness Visit  11/08/2023   Pneumonia Vaccine  Completed   DEXA scan (bone density measurement)  Completed   HPV Vaccine  Aged Out

## 2023-01-01 NOTE — Progress Notes (Unsigned)
New Patient Visit  There were no vitals taken for this visit.   Subjective:    Patient ID: Brianna Mcdonald, female    DOB: [birth year], 89 y.o.   MRN: [id number]  CC: No chief complaint on file.   HPI: Brianna Mcdonald is a 89 y.o. female presents for new patient visit to establish care.  Introduced to Publishing rights manager role and practice setting.  All questions answered.  Discussed provider/patient relationship and expectations.    Past Medical History:  Diagnosis Date   Allergy    Bronchitis, not specified as acute or chronic    Chest pain, unspecified    Diabetes mellitus    Edema    GERD (gastroesophageal reflux disease)    Hyperlipidemia    Hypertension    Irritable bowel syndrome    Other specified disorders of liver    Palpitations     Past Surgical History:  Procedure Laterality Date   APPENDECTOMY     DILATION AND CURETTAGE OF UTERUS     x 3   WRIST SURGERY      Family History  Problem Relation Age of Onset   Asthma Mother    Arthritis Mother        rheumatism   Allergies Sister    Drug abuse Other    Allergies Other    Arthritis Other    Cancer Brother        tear ducts.      Social History   Tobacco Use   Smoking status: Never   Smokeless tobacco: Never  Vaping Use   Vaping status: Never Used  Substance Use Topics   Alcohol use: No   Drug use: No    Current Outpatient Medications on File Prior to Visit  Medication Sig Dispense Refill   acetaminophen (TYLENOL) 325 MG tablet Take 650 mg by mouth every 6 (six) hours as needed.     albuterol (PROAIR HFA) 108 (90 Base) MCG/ACT inhaler Inhale 1 puff into the lungs every 6 (six) hours as needed for wheezing or shortness of breath (or with excersize). 18 g 0   Cholecalciferol 50 MCG (2000 UT) TABS Take 1 tablet (2,000 Units total) by mouth daily. 90 tablet 1   Cyanocobalamin (VITAMIN B-12) 1000 MCG SUBL Place 1 tablet under the tongue daily.     desloratadine (CLARINEX) 5 MG tablet Take  1 tablet by mouth once daily 90 tablet 1   fish oil-omega-3 fatty acids 1000 MG capsule Take 1 capsule by mouth daily.     fluticasone (FLONASE) 50 MCG/ACT nasal spray Place 2 sprays into both nostrils daily. 18 mL 0   lansoprazole (PREVACID) 30 MG capsule Take 1 capsule by mouth once daily 90 capsule 1   latanoprost (XALATAN) 0.005 % ophthalmic solution      levocetirizine (XYZAL) 5 MG tablet Take 5 mg by mouth daily.     metoprolol succinate (TOPROL-XL) 25 MG 24 hr tablet Take 1 tablet by mouth once daily 90 tablet 1   ondansetron (ZOFRAN-ODT) 4 MG disintegrating tablet Take 1 tablet (4 mg total) by mouth every 8 (eight) hours as needed for nausea or vomiting. (Patient not taking: Reported on 11/08/2022) 20 tablet 0   selenium 50 MCG TABS tablet Take 200 mcg by mouth daily. Patient states that she is takin g 200 mcg. (Patient not taking: Reported on 11/08/2022)     traMADol (ULTRAM) 50 MG tablet Take 1 tablet (50 mg total) by mouth every 6 (six) hours as  needed. (Patient not taking: Reported on 11/08/2022) 90 tablet 5   No current facility-administered medications on file prior to visit.     Review of Systems      Objective:    There were no vitals taken for this visit.  Wt Readings from Last 3 Encounters:  11/08/22 121 lb 4 oz (55 kg)  07/19/22 125 lb (56.7 kg)  06/11/20 129 lb 3.2 oz (58.6 kg)    BP Readings from Last 3 Encounters:  11/08/22 136/72  07/19/22 (!) 132/58  04/25/21 134/79    Physical Exam     Assessment & Plan:   Problem List Items Addressed This Visit   None    Follow up plan: No follow-ups on file.  Brianna Mcdonald A Jaramie Bastos

## 2023-01-02 ENCOUNTER — Encounter: Payer: Self-pay | Admitting: Nurse Practitioner

## 2023-01-02 ENCOUNTER — Ambulatory Visit (INDEPENDENT_AMBULATORY_CARE_PROVIDER_SITE_OTHER): Payer: Medicare Other | Admitting: Nurse Practitioner

## 2023-01-02 VITALS — BP 138/60 | HR 60 | Temp 97.3°F | Ht 61.0 in | Wt 123.4 lb

## 2023-01-02 DIAGNOSIS — R443 Hallucinations, unspecified: Secondary | ICD-10-CM | POA: Diagnosis not present

## 2023-01-02 DIAGNOSIS — M159 Polyosteoarthritis, unspecified: Secondary | ICD-10-CM

## 2023-01-02 DIAGNOSIS — I10 Essential (primary) hypertension: Secondary | ICD-10-CM | POA: Diagnosis not present

## 2023-01-02 DIAGNOSIS — K219 Gastro-esophageal reflux disease without esophagitis: Secondary | ICD-10-CM

## 2023-01-02 DIAGNOSIS — R413 Other amnesia: Secondary | ICD-10-CM | POA: Diagnosis not present

## 2023-01-02 DIAGNOSIS — R7303 Prediabetes: Secondary | ICD-10-CM

## 2023-01-02 DIAGNOSIS — E785 Hyperlipidemia, unspecified: Secondary | ICD-10-CM

## 2023-01-02 DIAGNOSIS — H401130 Primary open-angle glaucoma, bilateral, stage unspecified: Secondary | ICD-10-CM

## 2023-01-02 DIAGNOSIS — J301 Allergic rhinitis due to pollen: Secondary | ICD-10-CM

## 2023-01-02 DIAGNOSIS — R251 Tremor, unspecified: Secondary | ICD-10-CM

## 2023-01-02 DIAGNOSIS — M818 Other osteoporosis without current pathological fracture: Secondary | ICD-10-CM

## 2023-01-02 LAB — POCT URINALYSIS DIPSTICK
Bilirubin, UA: NEGATIVE
Blood, UA: NEGATIVE
Glucose, UA: NEGATIVE
Ketones, UA: NEGATIVE
Leukocytes, UA: NEGATIVE
Nitrite, UA: POSITIVE
Protein, UA: NEGATIVE
Spec Grav, UA: 1.02 (ref 1.010–1.025)
Urobilinogen, UA: 0.2 U/dL
pH, UA: 6 (ref 5.0–8.0)

## 2023-01-02 LAB — HEMOGLOBIN A1C: Hgb A1c MFr Bld: 5.7 % (ref 4.6–6.5)

## 2023-01-02 LAB — CBC WITH DIFFERENTIAL/PLATELET
Basophils Absolute: 0 10*3/uL (ref 0.0–0.1)
Basophils Relative: 0.5 % (ref 0.0–3.0)
Eosinophils Absolute: 0.1 10*3/uL (ref 0.0–0.7)
Eosinophils Relative: 2.9 % (ref 0.0–5.0)
HCT: 39.9 % (ref 36.0–46.0)
Hemoglobin: 13 g/dL (ref 12.0–15.0)
Lymphocytes Relative: 36.9 % (ref 12.0–46.0)
Lymphs Abs: 1.5 10*3/uL (ref 0.7–4.0)
MCHC: 32.5 g/dL (ref 30.0–36.0)
MCV: 98.6 fl (ref 78.0–100.0)
Monocytes Absolute: 0.4 10*3/uL (ref 0.1–1.0)
Monocytes Relative: 10.6 % (ref 3.0–12.0)
Neutro Abs: 1.9 10*3/uL (ref 1.4–7.7)
Neutrophils Relative %: 49.1 % (ref 43.0–77.0)
Platelets: 201 10*3/uL (ref 150.0–400.0)
RBC: 4.04 Mil/uL (ref 3.87–5.11)
RDW: 13.3 % (ref 11.5–15.5)
WBC: 3.9 10*3/uL — ABNORMAL LOW (ref 4.0–10.5)

## 2023-01-02 LAB — COMPREHENSIVE METABOLIC PANEL
ALT: 9 U/L (ref 0–35)
AST: 16 U/L (ref 0–37)
Albumin: 3.9 g/dL (ref 3.5–5.2)
Alkaline Phosphatase: 59 U/L (ref 39–117)
BUN: 15 mg/dL (ref 6–23)
CO2: 30 meq/L (ref 19–32)
Calcium: 9.4 mg/dL (ref 8.4–10.5)
Chloride: 101 meq/L (ref 96–112)
Creatinine, Ser: 0.87 mg/dL (ref 0.40–1.20)
GFR: 59.54 mL/min — ABNORMAL LOW (ref >60.00)
Glucose, Bld: 171 mg/dL — ABNORMAL HIGH (ref 70–99)
Potassium: 4.7 meq/L (ref 3.5–5.1)
Sodium: 137 meq/L (ref 135–145)
Total Bilirubin: 0.4 mg/dL (ref 0.2–1.2)
Total Protein: 7.1 g/dL (ref 6.0–8.3)

## 2023-01-02 NOTE — Assessment & Plan Note (Addendum)
She and her daughter states that she has been experiencing more hallucinations recently.  They originally started about a year and a half ago when they also noticed that she had to memory impairment starting.  Her Mini-Mental exam score to 25 today which is a slight decrease from 2019.  She is hearing things that are not being said causing her to be agitated at times.  She went and sat in her church for 5 hours and had the police called.  Will check CMP, CBC, RPR, TSH, vitamin B12 and an MRI of her brain today.  U/A negative. Referral placed to neurology.  Follow-up in 3 months or sooner with concerns.

## 2023-01-02 NOTE — Assessment & Plan Note (Signed)
Chronic, stable.  She is taking omega-3 fatty acid daily.

## 2023-01-02 NOTE — Assessment & Plan Note (Signed)
Chronic, ongoing.  She states that this is gotten slightly worse over the past few years.  She does not have the tremor at rest.  Will place referral to neurology with memory impairment and hallucinations.

## 2023-01-02 NOTE — Assessment & Plan Note (Signed)
Chronic, stable.  Continue metoprolol XL 25 mg daily.  Check CMP, CBC today.  Follow-up in 6 months.

## 2023-01-02 NOTE — Assessment & Plan Note (Signed)
Chronic, stable.  Continue Xyzal 5 mg daily and desloratadine 5 mg daily.

## 2023-01-02 NOTE — Assessment & Plan Note (Signed)
Last T-score was -2.9.  She is currently taking a vitamin D supplement daily.

## 2023-01-02 NOTE — Patient Instructions (Signed)
It was great to see you!  We are checking your labs today and will let you know the results via mychart/phone.   I have placed a referral to neurology.   I have ordered a MRI of her head.   Let's follow-up in 3 months, sooner if you have concerns.  If a referral was placed today, you will be contacted for an appointment. Please note that routine referrals can sometimes take up to 3-4 weeks to process. Please call our office if you haven't heard anything after this time frame.  Take care,  Rodman Pickle, NP

## 2023-01-02 NOTE — Assessment & Plan Note (Signed)
Chronic, stable.  Check A1c today and treat based on results.  Currently controlled with diet.

## 2023-01-02 NOTE — Assessment & Plan Note (Signed)
Chronic, stable.  Continue lansoprazole 30 mg daily.

## 2023-01-02 NOTE — Assessment & Plan Note (Signed)
She and her daughter states that she has been experiencing more hallucinations recently.  They originally started about a year and a half ago when they also noticed that she had to memory impairment starting.  Her Mini-Mental exam score to 25 today which is a slight decrease from 2019.  She is hearing things that are not being said causing her to be agitated at times.  She went and sat in her church for 5 hours and had the police called.  Will check CMP, CBC, RPR, TSH, vitamin B12 and an MRI of her brain today.  Referral placed to neurology.  Follow-up in 3 months or sooner with concerns.

## 2023-01-02 NOTE — Assessment & Plan Note (Signed)
Chronic, stable.  Continue Tylenol as needed for pain.  Ambulates with cane.  She denies recent falls.

## 2023-01-02 NOTE — Assessment & Plan Note (Signed)
Chronic, stable.  She is currently following with ophthalmology.  Continue latanoprost drops daily and recommendations from specialist

## 2023-01-03 LAB — TSH: TSH: 1.5 u[IU]/mL (ref 0.35–5.50)

## 2023-01-03 LAB — RPR: RPR Ser Ql: NONREACTIVE

## 2023-01-03 LAB — VITAMIN B12: Vitamin B-12: 1098 pg/mL — ABNORMAL HIGH (ref 211–911)

## 2023-01-22 ENCOUNTER — Other Ambulatory Visit: Payer: Self-pay | Admitting: Internal Medicine

## 2023-01-22 DIAGNOSIS — J301 Allergic rhinitis due to pollen: Secondary | ICD-10-CM

## 2023-01-24 ENCOUNTER — Encounter: Payer: Self-pay | Admitting: Podiatry

## 2023-01-24 ENCOUNTER — Ambulatory Visit (INDEPENDENT_AMBULATORY_CARE_PROVIDER_SITE_OTHER): Payer: Medicare Other | Admitting: Podiatry

## 2023-01-24 DIAGNOSIS — E1159 Type 2 diabetes mellitus with other circulatory complications: Secondary | ICD-10-CM | POA: Diagnosis not present

## 2023-01-24 DIAGNOSIS — B351 Tinea unguium: Secondary | ICD-10-CM | POA: Diagnosis not present

## 2023-01-24 DIAGNOSIS — M79674 Pain in right toe(s): Secondary | ICD-10-CM

## 2023-01-24 NOTE — Progress Notes (Signed)
This patient returns to the office for evaluation and treatment of long thick painful nails .  This patient is unable to trim her own nails since the patient cannot reach her feet.  Patient says the nails are painful walking and wearing his shoes.  He returns for preventive foot care services. ? ?General Appearance  Alert, conversant and in no acute stress. ? ?Vascular  Dorsalis pedis   pulses are palpable  Bilaterally.  Posterior tibial pulses are absent  B/L.  Capillary return is within normal limits  bilaterally. Cold feet.    Bilaterally.  Absent digital hair  B/L. ? ?Neurologic  Senn-Weinstein monofilament wire test within normal limits  bilaterally. Muscle power within normal limits bilaterally. ? ?Nails Thick disfigured discolored nails with subungual debris   hallux and second toes right foot.. No evidence of bacterial infection or drainage bilaterally. ? ?Orthopedic  No limitations of motion  feet .  No crepitus or effusions noted.  No bony pathology or digital deformities noted. ? ?Skin  normotropic skin with no porokeratosis noted bilaterally.  No signs of infections or ulcers noted.    ? ?Onychomycosis  Pain in toes right foot  Pain in toes left foot ? ?Debridement  of nails  1-5  B/L with a nail nipper.  Nails were then filed using a dremel tool with no incidents.    RTC  3 months  ? ? ?Helane Gunther DPM  ?

## 2023-01-25 ENCOUNTER — Telehealth: Payer: Self-pay | Admitting: Nurse Practitioner

## 2023-01-25 NOTE — Telephone Encounter (Signed)
Caller Name: daughter Jethro Bolus Ph #: [phone] Chief Complaint: Daughter called stating that her mother is having an "episode" from her dementia. They did not know what to do.   This call was transferred to Nurse Triage/Access Nurse. This is for documentation purposes. No follow up required at this time.

## 2023-01-26 NOTE — Telephone Encounter (Signed)
Noted  

## 2023-01-30 ENCOUNTER — Other Ambulatory Visit: Payer: Self-pay | Admitting: Internal Medicine

## 2023-02-08 ENCOUNTER — Other Ambulatory Visit: Payer: Self-pay | Admitting: Internal Medicine

## 2023-02-15 ENCOUNTER — Other Ambulatory Visit: Payer: Self-pay

## 2023-02-15 ENCOUNTER — Emergency Department (HOSPITAL_COMMUNITY): Payer: Medicare Other

## 2023-02-15 ENCOUNTER — Encounter (HOSPITAL_COMMUNITY): Payer: Self-pay

## 2023-02-15 ENCOUNTER — Emergency Department (HOSPITAL_COMMUNITY)
Admission: EM | Admit: 2023-02-15 | Discharge: 2023-02-16 | Disposition: A | Discharge status: Home / Self Care | Payer: Medicare Other | Attending: Emergency Medicine | Admitting: Emergency Medicine

## 2023-02-15 DIAGNOSIS — R443 Hallucinations, unspecified: Secondary | ICD-10-CM | POA: Diagnosis present

## 2023-02-15 DIAGNOSIS — R4182 Altered mental status, unspecified: Secondary | ICD-10-CM | POA: Insufficient documentation

## 2023-02-15 DIAGNOSIS — R456 Violent behavior: Secondary | ICD-10-CM | POA: Diagnosis not present

## 2023-02-15 DIAGNOSIS — Z1152 Encounter for screening for COVID-19: Secondary | ICD-10-CM | POA: Diagnosis not present

## 2023-02-15 LAB — COMPREHENSIVE METABOLIC PANEL
ALT: 13 U/L (ref 0–44)
AST: 23 U/L (ref 15–41)
Albumin: 3.8 g/dL (ref 3.5–5.0)
Alkaline Phosphatase: 57 U/L (ref 38–126)
Anion gap: 8 (ref 5–15)
BUN: 16 mg/dL (ref 8–23)
CO2: 26 mmol/L (ref 22–32)
Calcium: 9.3 mg/dL (ref 8.9–10.3)
Chloride: 102 mmol/L (ref 98–111)
Creatinine, Ser: 0.99 mg/dL (ref 0.44–1.00)
GFR, Estimated: 55 mL/min — ABNORMAL LOW (ref >60)
Glucose, Bld: 72 mg/dL (ref 70–99)
Potassium: 3.8 mmol/L (ref 3.5–5.1)
Sodium: 136 mmol/L (ref 135–145)
Total Bilirubin: 0.6 mg/dL (ref 0.3–1.2)
Total Protein: 7.5 g/dL (ref 6.5–8.1)

## 2023-02-15 LAB — CBC WITH DIFFERENTIAL/PLATELET
Abs Immature Granulocytes: 0.01 10*3/uL (ref 0.00–0.07)
Basophils Absolute: 0 10*3/uL (ref 0.0–0.1)
Basophils Relative: 1 %
Eosinophils Absolute: 0.1 10*3/uL (ref 0.0–0.5)
Eosinophils Relative: 2 %
HCT: 38.2 % (ref 36.0–46.0)
Hemoglobin: 12.5 g/dL (ref 12.0–15.0)
Immature Granulocytes: 0 %
Lymphocytes Relative: 39 %
Lymphs Abs: 1.9 10*3/uL (ref 0.7–4.0)
MCH: 32.3 pg (ref 26.0–34.0)
MCHC: 32.7 g/dL (ref 30.0–36.0)
MCV: 98.7 fL (ref 80.0–100.0)
Monocytes Absolute: 0.6 10*3/uL (ref 0.1–1.0)
Monocytes Relative: 12 %
Neutro Abs: 2.3 10*3/uL (ref 1.7–7.7)
Neutrophils Relative %: 46 %
Platelets: 178 10*3/uL (ref 150–400)
RBC: 3.87 MIL/uL (ref 3.87–5.11)
RDW: 12.4 % (ref 11.5–15.5)
WBC: 4.8 10*3/uL (ref 4.0–10.5)
nRBC: 0 % (ref 0.0–0.2)

## 2023-02-15 LAB — SARS CORONAVIRUS 2 BY RT PCR: SARS Coronavirus 2 by RT PCR: NEGATIVE

## 2023-02-15 NOTE — ED Provider Notes (Signed)
[place] EMERGENCY DEPARTMENT AT [facility] Provider Note   CSN: [id number] Arrival date & time: 02/15/23  2102     History Chief Complaint  Patient presents with   Hallucinations    HPI Brianna Mcdonald is a 89 y.o. female presenting for chief complaint of aggressive behavior.  Had to be brought in by EMS. On chart review, patient has been noted to have ongoing aggressive behavior, flares of hallucinations and 1 day went to her church and refused to leave requiring being escorted off by police. She does have a diagnosis of cognitive impairment with a decreasing MMSE over the past few years. Today patient has no acute complaints but does endorse ongoing auditory hallucinations. Family recounted the same story as above.   Patient's recorded medical, surgical, social, medication list and allergies were reviewed in the Snapshot window as part of the initial history.   Review of Systems   Review of Systems  Physical Exam Updated Vital Signs Pulse 69   Temp 98.1 F (36.7 C) (Oral)   Resp 16   Ht 5\' 1"  (1.549 m)   Wt 56 kg   SpO2 99%   BMI 23.33 kg/m  Physical Exam   ED Course/ Medical Decision Making/ A&P    Procedures Procedures   Medications Ordered in ED Medications - No data to display Medical Decision Making:   Brianna Mcdonald is a 89 y.o. female who presented to the ED today with altered mental status detailed above.    Additional history discussed with patient's family/caregivers.  Patient placed on continuous vitals and telemetry monitoring while in ED which was reviewed periodically.  Complete initial physical exam performed, notably the patient  was HDS in NAD.    Reviewed and confirmed nursing documentation for past medical history, family history, social history.    Initial Assessment:   With the patient's presentation of altered mental status, most likely diagnosis is delerium 2/2 infectious etiology (UTI/CAP/URI) vs metabolic  abnormality (Na/K/Mg/Ca) vs nonspecific etiology. Other diagnoses were considered including (but not limited to) CVA, ICH, intracranial mass, critical dehydration, heptatic dysfunction, uremia, hypercarbia, intoxication, endrocrine abnormality, toxidrome. These are considered less likely due to history of present illness and physical exam findings.   This is most consistent with an acute life/limb threatening illness complicated by underlying chronic conditions.  Initial Plan:  CTH to evaluate for intracranial etiology of patient's symptoms  Screening labs including CBC and Metabolic panel to evaluate for infectious or metabolic etiology of disease.  CXR to evaluate for structural/infectious intrathoracic pathology.  EKG to evaluate for cardiac pathology Objective evaluation as below reviewed   Initial Study Results:   Laboratory  All laboratory results reviewed without evidence of clinically relevant pathology.    EKG EKG was reviewed independently. Rate, rhythm, axis, intervals all examined and without medically relevant abnormality. ST segments without concerns for elevations.    Radiology:  All images reviewed independently. Agree with radiology report at this time.   DG Chest Portable 1 View  Result Date: 02/15/2023 CLINICAL DATA:  Altered mental status EXAM: PORTABLE CHEST 1 VIEW COMPARISON:  11/09/2015 FINDINGS: Low lung volumes. No focal opacity, pleural effusion, or pneumothorax. Stable cardiomediastinal silhouette. IMPRESSION: Low lung volumes. Electronically Signed   By: Brianna Mcdonald M.D.   On: 02/15/2023 22:06   CT HEAD WO CONTRAST ( )  Result Date: 02/15/2023 CLINICAL DATA:  Mental status change hallucinations EXAM: CT HEAD WITHOUT CONTRAST TECHNIQUE: Contiguous axial images were obtained from the base of the  skull through the vertex without intravenous contrast. RADIATION DOSE REDUCTION: This exam was performed according to the departmental dose-optimization program which  includes automated exposure control, adjustment of the mA and/or kV according to patient size and/or use of iterative reconstruction technique. COMPARISON:  None Available. FINDINGS: Brain: No acute territorial infarction, hemorrhage or intracranial mass. Moderate atrophy. Mild chronic small vessel ischemic changes of the white matter. Nonenlarged ventricles. Vascular: No hyperdense vessel or unexpected calcification. Carotid vascular calcification Skull: Normal. Negative for fracture or focal lesion. Sinuses/Orbits: No acute finding. Other: None IMPRESSION: 1. No CT evidence for acute intracranial abnormality. 2. Atrophy and mild chronic small vessel ischemic changes of the white matter. Electronically Signed   By: Brianna Mcdonald M.D.   On: 02/15/2023 22:06     Final Assessment and Plan:   Ultimately, patient is in no acute distress.  She is GCS 14 at this time ambulatory tolerating p.o. intake and has no acute complaints.  After my conversation with the family, her behavioral outburst is just that, behavioral in the setting of cognitive impairment likely developing dementia though formal diagnosis has not occurred yet.  No acute indication for further emergent intervention in the emergency department given reassuring screening studies tonight. Strongly recommended they schedule an appoint with neurology as recommended by PCP but with the worsening MMSE, PCP should feel comfortable initiating management for patient's dementia and working on placement if desired by family.  Will have the transitions of care team call the family tomorrow to assist in arranging for home health assistance.  No acute indication for further intervention at this time.  Disposition:  I have considered need for hospitalization, however, considering all of the above, I believe this patient is stable for discharge at this time.  Patient/family educated about specific return precautions for given chief complaint and symptoms.   Patient/family educated about follow-up with PCP.     Patient/family expressed understanding of return precautions and need for follow-up. Patient spoken to regarding all imaging and laboratory results and appropriate follow up for these results. All education provided in verbal form with additional information in written form. Time was allowed for answering of patient questions. Patient discharged.    Emergency Department Medication Summary:   Medications - No data to display      Clinical Impression:  1. Hallucinations      Discharge   Final Clinical Impression(s) / ED Diagnoses Final diagnoses:  Hallucinations    Rx / DC Orders ED Discharge Orders     None         Glyn Ade, MD 02/15/23 2324

## 2023-02-15 NOTE — Discharge Instructions (Addendum)
There is no identifiable medical cause for patient's hallucinations. She needs to follow-up with her primary care provider for discussion of medications on an as-needed basis if she is agitated.  Likely conversations need to have been related to long-term care for the patient. Our social worker will call to discuss home health assistance

## 2023-02-15 NOTE — ED Triage Notes (Signed)
Pt BIB EMS with reports of hallucinations that has been going on for a while. Pts family thinks that she has dementia. Pt has been having increased aggression.

## 2023-02-19 NOTE — Plan of Care (Signed)
CHL Tonsillectomy/Adenoidectomy, Postoperative PEDS care plan entered in error.

## 2023-02-26 ENCOUNTER — Other Ambulatory Visit: Payer: Self-pay | Admitting: Internal Medicine

## 2023-03-01 ENCOUNTER — Telehealth: Payer: Self-pay | Admitting: Nurse Practitioner

## 2023-03-01 ENCOUNTER — Other Ambulatory Visit: Payer: Self-pay | Admitting: Internal Medicine

## 2023-03-01 MED ORDER — METOPROLOL SUCCINATE ER 25 MG PO TB24: 25.0000 mg | ORAL_TABLET | Freq: Every day | ORAL | 0 refills | Status: DC

## 2023-03-01 NOTE — Telephone Encounter (Signed)
Patient's niece notified of below message.

## 2023-03-01 NOTE — Telephone Encounter (Signed)
Pt is asking about eye drops, please advise niece, Carlus Pavlov

## 2023-03-01 NOTE — Telephone Encounter (Signed)
Prescription Request  03/01/2023  LOV: 01/02/2023  What is the name of the medication or equipment? metoprolol succinate (TOPROL-XL) 25 MG 24 hr tablet [[id number]]   Have you contacted your pharmacy to request a refill? No   Which pharmacy would you like this sent to?   [facility]- [address],[place] [address] [phone]  Patient notified that their request is being sent to the clinical staff for review and that they should receive a response within 2 business days.   Please advise at  pt niece Carlus Pavlov conyers @ [phone]

## 2023-03-02 NOTE — Telephone Encounter (Signed)
I returned Brianna Mcdonald's phone call regarding eye drops and she said that she is unsure is she has refills left but will check today when she see's patient and let the office know.

## 2023-05-10 ENCOUNTER — Ambulatory Visit: Payer: Medicare Other | Admitting: Podiatry

## 2023-05-26 ENCOUNTER — Other Ambulatory Visit: Payer: Self-pay | Admitting: Nurse Practitioner
